# Patient Record
Sex: Male | Born: 1950
Health system: Southern US, Community
[De-identification: ages and names within clinical notes are randomized; demographics above are authoritative.]

## PROBLEM LIST (undated history)

## (undated) DIAGNOSIS — T7840XA Allergy, unspecified, initial encounter: Secondary | ICD-10-CM

## (undated) DIAGNOSIS — E119 Type 2 diabetes mellitus without complications: Secondary | ICD-10-CM

## (undated) HISTORY — DX: Allergy, unspecified, initial encounter: T78.40XA

## (undated) HISTORY — DX: Type 2 diabetes mellitus without complications: E11.9

---

## 2000-11-15 ENCOUNTER — Encounter: Payer: Self-pay | Admitting: Endocrinology

## 2000-11-15 ENCOUNTER — Ambulatory Visit (HOSPITAL_COMMUNITY)
Admission: RE | Admit: 2000-11-15 | Discharge: 2000-11-15 | Payer: Self-pay | Admitting: Physical Medicine and Rehabilitation

## 2010-09-17 LAB — HM COLONOSCOPY: HM COLON: NORMAL

## 2011-10-23 ENCOUNTER — Ambulatory Visit (INDEPENDENT_AMBULATORY_CARE_PROVIDER_SITE_OTHER): Payer: 59

## 2011-10-23 DIAGNOSIS — R509 Fever, unspecified: Secondary | ICD-10-CM

## 2011-10-23 DIAGNOSIS — R05 Cough: Secondary | ICD-10-CM

## 2012-06-05 ENCOUNTER — Ambulatory Visit (INDEPENDENT_AMBULATORY_CARE_PROVIDER_SITE_OTHER): Payer: 59 | Admitting: Emergency Medicine

## 2012-06-05 VITALS — BP 102/70 | HR 68 | Temp 98.2°F | Resp 16 | Ht 68.0 in | Wt 179.2 lb

## 2012-06-05 DIAGNOSIS — N508 Other specified disorders of male genital organs: Secondary | ICD-10-CM

## 2012-06-05 DIAGNOSIS — M899 Disorder of bone, unspecified: Secondary | ICD-10-CM

## 2012-06-05 DIAGNOSIS — N503 Cyst of epididymis: Secondary | ICD-10-CM

## 2012-06-05 DIAGNOSIS — M25559 Pain in unspecified hip: Secondary | ICD-10-CM

## 2012-06-05 MED ORDER — NAPROXEN SODIUM 550 MG PO TABS: 550.0000 mg | ORAL_TABLET | Freq: Two times a day (BID) | ORAL | Status: AC

## 2012-06-05 NOTE — Progress Notes (Signed)
   Date:  06/05/2012   Name:  Joseph Sharp   DOB:  01/18/1951   MRN:  [id number]  PCP:  Tally Due, MD    Chief Complaint: Rectal Pain and Mass   History of Present Illness:  Joseph Sharp is a 61 y.o. very pleasant male patient who presents with the following:  Tender and painful ischium no history of injury or fall.  Says that he has pain when he sits for prolonged period.  No neurologic symptoms or radiation of pain.  No hip joint pain or limitation of motion. Tender mass on right epididymis  No history of infection or injury  There is no problem list on file for this patient.   No past medical history on file.  No past surgical history on file.  History  Substance Use Topics  . Smoking status: Never Smoker   . Smokeless tobacco: Not on file  . Alcohol Use: No    No family history on file.  No Known Allergies  Medication list has been reviewed and updated.  Current Outpatient Prescriptions on File Prior to Visit  Medication Sig Dispense Refill  . glimepiride (AMARYL) 1 MG tablet Take 1 mg by mouth Nightly.      . lovastatin (MEVACOR) 20 MG tablet Take 20 mg by mouth every morning.      . metFORMIN (GLUCOPHAGE) 500 MG tablet Take 500 mg by mouth 2 (two) times daily with a meal.      . pioglitazone (ACTOS) 30 MG tablet Take 30 mg by mouth daily.        Review of Systems:  As per HPI, otherwise negative.    Physical Examination: Filed Vitals:   06/05/12 1903  BP: 102/70  Pulse: 68  Temp: 98.2 F (36.8 C)  Resp: 16   Filed Vitals:   06/05/12 1903  Height: 5\' 8"  (1.727 m)  Weight: 179 lb 3.2 oz (81.285 kg)   Body mass index is 27.25 kg/(m^2). Ideal Body Weight: Weight in (lb) to have BMI = 25: 164.1    GEN: WDWN, NAD, Non-toxic, Alert & Oriented x 3 HEENT: Atraumatic, Normocephalic.  Ears and Nose: No external deformity. EXTR: No clubbing/cyanosis/edema NEURO: Normal gait.  PSYCH: Normally interactive. Conversant. Not  depressed or anxious appearing.  Calm demeanor.  Genitalia:  Normal male circumcised.  Pea sized firm cyst in tail of epididymis. Hip  Full active and passive ROM not tender or painful.  Has tenderness over ischium on left.  Normal gait  Assessment and Plan: Epididymal cyst Pain in buttock Offered xray and refused Offered urology consult and refused.  Ortho consult Anaprox   Carmelina Dane, MD

## 2013-01-11 HISTORY — PX: EYE SURGERY: SHX253

## 2013-06-17 ENCOUNTER — Other Ambulatory Visit: Payer: Self-pay | Admitting: *Deleted

## 2013-06-17 ENCOUNTER — Other Ambulatory Visit: Payer: Self-pay | Admitting: Endocrinology

## 2013-06-17 ENCOUNTER — Other Ambulatory Visit (INDEPENDENT_AMBULATORY_CARE_PROVIDER_SITE_OTHER): Payer: 59

## 2013-06-17 DIAGNOSIS — E7800 Pure hypercholesterolemia, unspecified: Secondary | ICD-10-CM | POA: Insufficient documentation

## 2013-06-17 DIAGNOSIS — E119 Type 2 diabetes mellitus without complications: Secondary | ICD-10-CM | POA: Insufficient documentation

## 2013-06-17 DIAGNOSIS — IMO0001 Reserved for inherently not codable concepts without codable children: Secondary | ICD-10-CM

## 2013-06-17 LAB — COMPREHENSIVE METABOLIC PANEL
ALT: 17 U/L (ref 0–53)
AST: 15 U/L (ref 0–37)
Albumin: 4.1 g/dL (ref 3.5–5.2)
CO2: 25 mEq/L (ref 19–32)
Calcium: 9.5 mg/dL (ref 8.4–10.5)
Chloride: 108 mEq/L (ref 96–112)
GFR: 79.98 mL/min (ref >60.00)
Potassium: 4.3 mEq/L (ref 3.5–5.1)

## 2013-06-17 LAB — MICROALBUMIN / CREATININE URINE RATIO
Creatinine,U: 197.3 mg/dL
Microalb Creat Ratio: 0.3 mg/g (ref 0.0–30.0)
Microalb, Ur: 0.6 mg/dL (ref 0.0–1.9)

## 2013-06-17 LAB — HEMOGLOBIN A1C: Hgb A1c MFr Bld: 7 % — ABNORMAL HIGH (ref 4.6–6.5)

## 2013-06-17 LAB — LIPID PANEL: Total CHOL/HDL Ratio: 3

## 2013-06-20 ENCOUNTER — Encounter: Payer: Self-pay | Admitting: Endocrinology

## 2013-06-20 ENCOUNTER — Ambulatory Visit (INDEPENDENT_AMBULATORY_CARE_PROVIDER_SITE_OTHER): Payer: 59 | Admitting: Endocrinology

## 2013-06-20 VITALS — BP 122/52 | HR 73 | Temp 97.9°F | Resp 12 | Ht 69.0 in | Wt 182.8 lb

## 2013-06-20 NOTE — Patient Instructions (Addendum)
Please check blood sugars at least half the time about 2 hours after any meal and as directed on waking up. Please bring blood sugar monitor to each visit  May take evening Prandin at bedtime  Continue regular exercise and low fat diet

## 2013-06-20 NOTE — Progress Notes (Signed)
Patient ID: Joseph Sharp, male   DOB: 02/11/1951, 62 y.o.   MRN: [id number]  Joseph Sharp is an 62 y.o. male.   Reason for Appointment: Diabetes follow-up   History of Present Illness   Diagnosis: Type 2 DIABETES MELITUS     PAST history: He has had long-standing diabetes managed with multiple drugs and usually well controlled His blood sugar control is somewhat dependent on his compliance with diet and exercise regimen  RECENT history: He has been out of his Actos for the last 2 weeks and blood sugars seem a little higher. Also has been checking blood sugars very sporadically recently His fasting readings appear to be high especially on his lab work. He admits that he forgets to take his Prandin at suppertime but also does not check his blood sugars after supper  Side effects from medications: None Proper timing of medications in relation to meals:  not taking evening dose of Prandin      Monitors blood glucose: Once a day.    Glucometer: One Touch.          Blood Glucose readings from meter download: readings before breakfast: 150-171 with only 3 readings. Overnight 122 PC breakfast 181, 202 and afternoon 102, 138.  Hypoglycemia frequency: Never.          Meals: 3 meals per day.          Physical activity: exercise: walking 4/7             Wt Readings from Last 3 Encounters:  06/20/13 182 lb 12.8 oz (82.918 kg)  06/05/12 179 lb 3.2 oz (81.285 kg)    Appointment on 06/17/2013  Component Date Value Range Status  . Hemoglobin A1C 06/17/2013 7.0* 4.6 - 6.5 % Final   Glycemic Control Guidelines for People with Diabetes:Non Diabetic:  <6%Goal of Therapy: <7%Additional Action Suggested:  >8%   . Sodium 06/17/2013 141  135 - 145 mEq/L Final  . Potassium 06/17/2013 4.3  3.5 - 5.1 mEq/L Final  . Chloride 06/17/2013 108  96 - 112 mEq/L Final  . CO2 06/17/2013 25  19 - 32 mEq/L Final  . Glucose, Bld 06/17/2013 186* 70 - 99 mg/dL Final  . BUN 40/98/1191 18  6 - 23 mg/dL  Final  . Creatinine, Ser 06/17/2013 1.0  0.4 - 1.5 mg/dL Final  . Total Bilirubin 06/17/2013 0.4  0.3 - 1.2 mg/dL Final  . Alkaline Phosphatase 06/17/2013 57  39 - 117 U/L Final  . AST 06/17/2013 15  0 - 37 U/L Final  . ALT 06/17/2013 17  0 - 53 U/L Final  . Total Protein 06/17/2013 6.6  6.0 - 8.3 g/dL Final  . Albumin 47/82/9562 4.1  3.5 - 5.2 g/dL Final  . Calcium 13/06/6577 9.5  8.4 - 10.5 mg/dL Final  . GFR 46/96/2952 79.98  >60.00 mL/min Final  . Cholesterol 06/17/2013 122  0 - 200 mg/dL Final   ATP III Classification       Desirable:  < 200 mg/dL               Borderline High:  200 - 239 mg/dL          High:  > = 841 mg/dL  . Triglycerides 06/17/2013 83.0  0.0 - 149.0 mg/dL Final   Normal:  <324 mg/dLBorderline High:  150 - 199 mg/dL  . HDL 06/17/2013 40.00  >39.00 mg/dL Final  . VLDL 40/08/2724 16.6  0.0 - 40.0 mg/dL Final  . LDL  Cholesterol 06/17/2013 65  0 - 99 mg/dL Final  . Total CHOL/HDL Ratio 06/17/2013 3   Final                  Men          Women1/2 Average Risk     3.4          3.3Average Risk          5.0          4.42X Average Risk          9.6          7.13X Average Risk          15.0          11.0                      . Microalb, Ur 06/17/2013 0.6  0.0 - 1.9 mg/dL Final  . Creatinine,U 40/98/1191 197.3   Final  . Microalb Creat Ratio 06/17/2013 0.3  0.0 - 30.0 mg/g Final      Medication List       This list is accurate as of: 06/20/13  2:59 PM.  Always use your most recent med list.               aspirin 81 MG tablet  Take 81 mg by mouth daily.     cholecalciferol 1000 UNITS tablet  Commonly known as:  VITAMIN D  Take 1,000 Units by mouth daily.     latanoprost 0.005 % ophthalmic solution  Commonly known as:  XALATAN     lovastatin 20 MG tablet  Commonly known as:  MEVACOR  Take 20 mg by mouth every morning.     metFORMIN 500 MG tablet  Commonly known as:  GLUCOPHAGE  Take 500 mg by mouth 2 (two) times daily with a meal. 2 tablets twice a day      pioglitazone 30 MG tablet  Commonly known as:  ACTOS  Take 30 mg by mouth daily.     repaglinide 1 MG tablet  Commonly known as:  PRANDIN  1 mg.     TRADJENTA 5 MG Tabs tablet  Generic drug:  linagliptin  Take 5 mg by mouth daily.        Allergies: No Known Allergies  No past medical history on file.  No past surgical history on file.  No family history on file.  Social History:  reports that he has never smoked. He does not have any smokeless tobacco history on file. He reports that he does not drink alcohol or use illicit drugs.  Review of Systems:  No history of hypertension  HYPERLIPIDEMIA: The lipid abnormality consists of elevated LDL, usually well controlled with lovastatin and LDL is 65.  He has had mild BPH     Examination:   BP 122/52  Pulse 73  Temp(Src) 97.9 F (36.6 C)  Resp 12  Ht 5\' 9"  (1.753 m)  Wt 182 lb 12.8 oz (82.918 kg)  BMI 26.98 kg/m2  SpO2 95%  Body mass index is 26.98 kg/(m^2).   ASSESSMENT/ PLAN::   Diabetes type 2   The patient's diabetes control appears to be overall fairly well controlled although his A1c is relatively higher at 7% He appears to have significant hyperglycemia on waking up especially with a glucose of 186 on the lab work He is however noncompliant with his evening Prandin frequently. Also not clear what his blood sugars are  after supper He has not been taking his Actos for 2 weeks and this may be influencing his readings  He needs to check his blood sugars more often and discussed frequency of monitoring as well as timing and targets He will try to take his Prandin at bedtime at night to help overnight hypoglycemia Continue Actos, metformin and Tradjenta   Joseph Sharp 06/20/2013, 2:59 PM

## 2013-07-01 ENCOUNTER — Other Ambulatory Visit: Payer: Self-pay | Admitting: Endocrinology

## 2013-09-05 ENCOUNTER — Other Ambulatory Visit: Payer: Self-pay | Admitting: Endocrinology

## 2013-09-19 ENCOUNTER — Other Ambulatory Visit (INDEPENDENT_AMBULATORY_CARE_PROVIDER_SITE_OTHER): Payer: 59

## 2013-09-19 DIAGNOSIS — IMO0001 Reserved for inherently not codable concepts without codable children: Secondary | ICD-10-CM

## 2013-09-19 LAB — BASIC METABOLIC PANEL
BUN: 18 mg/dL (ref 6–23)
Chloride: 106 mEq/L (ref 96–112)
Creatinine, Ser: 1 mg/dL (ref 0.4–1.5)
GFR: 81.78 mL/min (ref >60.00)

## 2013-09-26 ENCOUNTER — Ambulatory Visit (INDEPENDENT_AMBULATORY_CARE_PROVIDER_SITE_OTHER): Payer: 59 | Admitting: Endocrinology

## 2013-09-26 ENCOUNTER — Encounter: Payer: Self-pay | Admitting: Endocrinology

## 2013-09-26 VITALS — BP 138/88 | HR 83 | Temp 98.6°F | Resp 12 | Ht 69.0 in | Wt 188.3 lb

## 2013-09-26 DIAGNOSIS — R5381 Other malaise: Secondary | ICD-10-CM

## 2013-09-26 DIAGNOSIS — E7800 Pure hypercholesterolemia, unspecified: Secondary | ICD-10-CM

## 2013-09-26 NOTE — Progress Notes (Signed)
Patient ID: Joseph Sharp, male   DOB: 02/06/1951, 62 y.o.   MRN: [id number]  Joseph Sharp is an 62 y.o. male.   Reason for Appointment: Diabetes follow-up   History of Present Illness   Diagnosis: Type 2 DIABETES MELITUS, diagnosed in 1977     PAST history: He has had long-standing diabetes managed with multiple drugs and usually well controlled His blood sugar control is somewhat dependent on his compliance with diet and exercise regimen  RECENT history: On his last visit his blood sugars seem a little higher.  Since most of his high readings her fasting and was told to try his Prandin at bedtime instead of suppertime However he still forgetting to do this and his fasting readings are consistently high Also he has gained weight from inconsistent compliance with diet Although he is checking his blood sugars more frequently the last 3 days he has only occasional readings later in the day and only once after supper Also has been checking blood sugars very sporadically recently A1c is still higher than his usual upper normal level  Side effects from medications: None Proper timing of medications in relation to meals:  not taking evening dose of Prandin      Monitors blood glucose: Once a day.    Glucometer: One Touch.          Blood Glucose readings from meter download: readings before breakfast: 143-173 and nonfasting otherwise 107-172 with only one high reading  Hypoglycemia frequency:  none.          Meals: 3 meals per day.          Physical activity: exercise: walking 3/7 days             Wt Readings from Last 3 Encounters:  09/26/13 188 lb 4.8 oz (85.412 kg)  06/20/13 182 lb 12.8 oz (82.918 kg)  06/05/12 179 lb 3.2 oz (81.285 kg)    No visits with results within 1 Week(s) from this visit. Latest known visit with results is:  Appointment on 09/19/2013  Component Date Value Range Status  . Hemoglobin A1C 09/19/2013 7.0* 4.6 - 6.5 % Final   Glycemic Control  Guidelines for People with Diabetes:Non Diabetic:  <6%Goal of Therapy: <7%Additional Action Suggested:  >8%   . Sodium 09/19/2013 140  135 - 145 mEq/L Final  . Potassium 09/19/2013 4.2  3.5 - 5.1 mEq/L Final  . Chloride 09/19/2013 106  96 - 112 mEq/L Final  . CO2 09/19/2013 26  19 - 32 mEq/L Final  . Glucose, Bld 09/19/2013 104* 70 - 99 mg/dL Final  . BUN 40/98/1191 18  6 - 23 mg/dL Final  . Creatinine, Ser 09/19/2013 1.0  0.4 - 1.5 mg/dL Final  . Calcium 47/82/9562 9.9  8.4 - 10.5 mg/dL Final  . GFR 13/06/6577 81.78  >60.00 mL/min Final      Medication List       This list is accurate as of: 09/26/13 11:59 PM.  Always use your most recent med list.               aspirin 81 MG tablet  Take 81 mg by mouth daily.     Azelastine HCl 0.15 % Soln     cholecalciferol 1000 UNITS tablet  Commonly known as:  VITAMIN D  Take 1,000 Units by mouth daily.     EPIPEN 2-PAK 0.3 mg/0.3 mL Soaj injection  Generic drug:  EPINEPHrine     fluticasone 50 MCG/ACT nasal spray  Commonly known as:  FLONASE     latanoprost 0.005 % ophthalmic solution  Commonly known as:  XALATAN     lovastatin 20 MG tablet  Commonly known as:  MEVACOR  Take 20 mg by mouth every morning.     metFORMIN 500 MG 24 hr tablet  Commonly known as:  GLUCOPHAGE-XR  TAKE 4 TABLETS ONCE DAILY     pioglitazone 30 MG tablet  Commonly known as:  ACTOS  TAKE 1 TABLET ONCE DAILY     repaglinide 1 MG tablet  Commonly known as:  PRANDIN  TAKE 1 TABLET BY MOUTH BEFORE SUPPER AND 2 BEFORE BREAKFAST     TRADJENTA 5 MG Tabs tablet  Generic drug:  linagliptin  Take 5 mg by mouth daily.        Allergies: No Known Allergies  Past Medical History  Diagnosis Date  . Allergy     No past surgical history on file.  Family History  Problem Relation Age of Onset  . Diabetes Father     Social History:  reports that he has never smoked. He does not have any smokeless tobacco history on file. He reports that he does  not drink alcohol or use illicit drugs.  Review of Systems:  No history of hypertension   HYPERLIPIDEMIA: The lipid abnormality consists of elevated LDL, usually well controlled with lovastatin and LDL was last 65.  He has had mild BPH  Has a history of erectile dysfunction  He is asking about taking thyroid levels because of his feeling of tiredness, some decrease in memory some dry skin and also some decrease in libido   He previously had transient hypogonadism which subsequently had improved     Examination:   BP 138/88  Pulse 83  Temp(Src) 98.6 F (37 C)  Resp 12  Ht 5\' 9"  (1.753 m)  Wt 188 lb 4.8 oz (85.412 kg)  BMI 27.79 kg/m2  SpO2 97%  Body mass index is 27.79 kg/(m^2).  Repeat blood pressure 122/80  ASSESSMENT/ PLAN::   Diabetes type 2   The patient's diabetes control appears to be overall more difficult to control especially overnight readings Also he is gaining significant amount of weight He has difficulty remembering his mealtime Prandin and previously had difficulty with some hypoglycemia using Amaryl Most likely he can benefit from a GLP-1 drug instead of Tradjenta and Prandin and should be able to get some weight loss also  Have discussed with the patient the use of GLP-1 drugs and the mechanism of how they work and benefit blood glucose as well as potentially help with weight loss, increase satiety and gastric fullness. Explained possible side effects and safety information Have shown him how to use the pen device. Patient sample of 0.75 mg and co-pay card given He can stop his Tradjenta when finished and also does not need to take Prandin except in the morning Encouraged him to improve his diet also  Also needs to check more readings after meals and continue exercise  To check thyroid levels on his next visit; his symptoms may be partially related to hypogonadism related to his metabolic syndrome and weight gain  Joseph Sharp 09/29/2013, 3:01 PM

## 2013-09-26 NOTE — Patient Instructions (Signed)
Start Trulicity ONCE  A WEEK 0.75 MG  STOP Tradgenta when out of it  Stop Prandin in pm  Walk daily  Please check blood sugars at least half the time about 2 hours after any meal and as directed on waking up. Please bring blood sugar monitor to each visit

## 2013-09-29 ENCOUNTER — Encounter: Payer: Self-pay | Admitting: Endocrinology

## 2013-10-03 ENCOUNTER — Other Ambulatory Visit: Payer: Self-pay | Admitting: *Deleted

## 2013-10-03 ENCOUNTER — Telehealth: Payer: Self-pay | Admitting: Endocrinology

## 2013-10-03 MED ORDER — DULAGLUTIDE 0.75 MG/0.5ML ~~LOC~~ SOAJ: SUBCUTANEOUS | Status: DC

## 2013-10-03 NOTE — Telephone Encounter (Signed)
Pt states the new prescription trulicity (?) needs to be called into pharmacy  [facility] [address] [phone]  Thank You :)

## 2013-10-17 ENCOUNTER — Ambulatory Visit: Payer: 59 | Admitting: Physician Assistant

## 2013-10-17 VITALS — BP 112/66 | HR 87 | Temp 98.5°F | Resp 16 | Ht 68.0 in | Wt 184.2 lb

## 2013-10-17 DIAGNOSIS — J329 Chronic sinusitis, unspecified: Secondary | ICD-10-CM

## 2013-10-17 DIAGNOSIS — R05 Cough: Secondary | ICD-10-CM

## 2013-10-17 MED ORDER — AMOXICILLIN-POT CLAVULANATE 875-125 MG PO TABS: 1.0000 | ORAL_TABLET | Freq: Two times a day (BID) | ORAL | Status: DC

## 2013-10-17 NOTE — Patient Instructions (Signed)
THE 3 SIMPLE RULES FOR NASAL SPRAY USE: 1. Don't snort. 2. Look at your toes and spray up your nose. 3. Use the opposite hand to spray in both nostrils.  Use the Astelin nasal spray 2 sprays in each nostril 2 times each day. OTC Mucinex is a great product to thin mucous.  Get plenty of rest and drink at least 64 ounces of water daily.

## 2013-10-17 NOTE — Progress Notes (Signed)
Subjective:    Patient ID: Joseph Sharp, male    DOB: November 17, 1950, 62 y.o.   MRN: [id number]  PCP: Tally Due, MD  Chief Complaint  Patient presents with  . Sore Throat    x 8 days   . Cough    productive   . Nasal Congestion    HPI  "I always have chills." "Nothing that feels like a fever."  No GI/GU symptoms.  Hasn't been checking his glucose at home, concerned that it may be up since he's been sick.   Feels worst in the mornings, but improves as he gets up and going, then starts to feel bad again before bed. HIs wife finally convinced him to come in for evaluation.  Medications, allergies, past medical history, surgical history, family history, social history and problem list reviewed and updated.  Patient Active Problem List   Diagnosis Date Noted  . Type II or unspecified type diabetes mellitus without mention of complication, uncontrolled 06/17/2013  . Pure hypercholesterolemia 06/17/2013   Prior to Admission medications   Medication Sig Start Date End Date Taking? Authorizing Provider  aspirin 81 MG tablet Take 81 mg by mouth daily.   Yes Historical Provider, MD  Azelastine HCl 0.15 % SOLN  07/29/13  Yes Historical Provider, MD  cholecalciferol (VITAMIN D) 1000 UNITS tablet Take 1,000 Units by mouth daily.   Yes Historical Provider, MD  Dulaglutide (TRULICITY) 0.75 MG/0.5ML SOPN Inject into the skin once a week 10/03/13  Yes Reather Littler, MD  EPIPEN 2-PAK 0.3 MG/0.3ML SOAJ injection  07/29/13  Yes Historical Provider, MD  fluticasone (FLONASE) 50 MCG/ACT nasal spray  07/29/13  Yes Historical Provider, MD  latanoprost (XALATAN) 0.005 % ophthalmic solution  04/26/13  Yes Historical Provider, MD  linagliptin (TRADJENTA) 5 MG TABS tablet Take 5 mg by mouth daily.   Yes Historical Provider, MD  lovastatin (MEVACOR) 20 MG tablet Take 20 mg by mouth every morning.   Yes Historical Provider, MD  metFORMIN (GLUCOPHAGE-XR) 500 MG 24 hr tablet TAKE 4 TABLETS ONCE DAILY  09/05/13  Yes Reather Littler, MD  pioglitazone (ACTOS) 30 MG tablet TAKE 1 TABLET ONCE DAILY 09/05/13  Yes Reather Littler, MD  repaglinide (PRANDIN) 1 MG tablet 2 BEFORE BREAKFAST 07/01/13  Yes Reather Littler, MD   No Known Allergies    Review of Systems As above.    Objective:   Physical Exam  Vitals reviewed. Constitutional: He is oriented to person, place, and time. Vital signs are normal. He appears well-developed and well-nourished. No distress.  HENT:  Head: Normocephalic and atraumatic.  Right Ear: Hearing, tympanic membrane, external ear and ear canal normal.  Left Ear: Hearing, tympanic membrane, external ear and ear canal normal.  Nose: Mucosal edema and rhinorrhea present.  No foreign bodies. Right sinus exhibits no maxillary sinus tenderness and no frontal sinus tenderness. Left sinus exhibits no maxillary sinus tenderness and no frontal sinus tenderness.  Mouth/Throat: Uvula is midline, oropharynx is clear and moist and mucous membranes are normal. No uvula swelling. No oropharyngeal exudate.  Mucopurulent drainage noted in the posterior pharynx.  Eyes: Conjunctivae and EOM are normal. Pupils are equal, round, and reactive to light. Right eye exhibits no discharge. Left eye exhibits no discharge. No scleral icterus.  Neck: Trachea normal, normal range of motion and full passive range of motion without pain. Neck supple. No mass and no thyromegaly present.  Cardiovascular: Normal rate, regular rhythm and normal heart sounds.   Pulmonary/Chest: Effort normal and  breath sounds normal.  Lymphadenopathy:       Head (right side): No submandibular, no tonsillar, no preauricular, no posterior auricular and no occipital adenopathy present.       Head (left side): No submandibular, no tonsillar, no preauricular and no occipital adenopathy present.    He has no cervical adenopathy.       Right: No supraclavicular adenopathy present.       Left: No supraclavicular adenopathy present.    Neurological: He is alert and oriented to person, place, and time. He has normal strength. No cranial nerve deficit or sensory deficit.  Skin: Skin is warm, dry and intact. No rash noted.  Psychiatric: He has a normal mood and affect. His speech is normal and behavior is normal.          Assessment & Plan:  1. Sinusitis Likely secondary to initial viral URI - amoxicillin-clavulanate (AUGMENTIN) 875-125 MG per tablet; Take 1 tablet by mouth 2 (two) times daily.  Dispense: 20 tablet; Refill: 0  2. Cough Due to post-nasal drainage  Fernande Bras, PA-C Certified Physician Assistant [facility]

## 2013-11-11 ENCOUNTER — Other Ambulatory Visit (INDEPENDENT_AMBULATORY_CARE_PROVIDER_SITE_OTHER): Payer: 59

## 2013-11-11 LAB — BASIC METABOLIC PANEL
CO2: 24 mEq/L (ref 19–32)
Calcium: 9.5 mg/dL (ref 8.4–10.5)
Chloride: 111 mEq/L (ref 96–112)
GFR: 75.54 mL/min (ref >60.00)
Glucose, Bld: 155 mg/dL — ABNORMAL HIGH (ref 70–99)
Potassium: 4 mEq/L (ref 3.5–5.1)
Sodium: 142 mEq/L (ref 135–145)

## 2013-11-11 LAB — FRUCTOSAMINE: Fructosamine: 265 umol/L (ref <285)

## 2013-11-11 LAB — T4, FREE: Free T4: 0.77 ng/dL (ref 0.60–1.60)

## 2013-11-14 ENCOUNTER — Ambulatory Visit: Payer: 59 | Admitting: Endocrinology

## 2013-11-14 DIAGNOSIS — Z0289 Encounter for other administrative examinations: Secondary | ICD-10-CM

## 2013-11-18 ENCOUNTER — Encounter: Payer: Self-pay | Admitting: Endocrinology

## 2013-11-18 ENCOUNTER — Ambulatory Visit (INDEPENDENT_AMBULATORY_CARE_PROVIDER_SITE_OTHER): Payer: 59 | Admitting: Endocrinology

## 2013-11-18 VITALS — BP 116/68 | HR 83 | Temp 98.2°F | Resp 12 | Ht 69.0 in | Wt 185.1 lb

## 2013-11-18 DIAGNOSIS — E7800 Pure hypercholesterolemia, unspecified: Secondary | ICD-10-CM

## 2013-11-18 DIAGNOSIS — IMO0001 Reserved for inherently not codable concepts without codable children: Secondary | ICD-10-CM

## 2013-11-18 DIAGNOSIS — R5383 Other fatigue: Secondary | ICD-10-CM

## 2013-11-18 DIAGNOSIS — E1165 Type 2 diabetes mellitus with hyperglycemia: Principal | ICD-10-CM

## 2013-11-18 DIAGNOSIS — R5381 Other malaise: Secondary | ICD-10-CM

## 2013-11-18 NOTE — Progress Notes (Signed)
Patient ID: Joseph Sharp, male   DOB: 11/14/1950, 63 y.o.   MRN: [id number]   Reason for Appointment: Diabetes follow-up   History of Present Illness   Diagnosis: Type 2 DIABETES MELITUS, diagnosed in 1977     PAST history: He has had long-standing diabetes managed with multiple drugs and usually well controlled His blood sugar control is somewhat dependent on his compliance with diet and exercise regimen  RECENT history: On his last visit his blood sugars were higher and A1c was higher than his usual upper normal level Because of this he was started on Trulicity injections, 4.78 mg With this he appears to have overall better readings although does have periodic high readings still in the morning He thinks it has helped portion control somewhat and has lost about 3 pounds She has not had any nausea and has been able to do the injection weekly without difficulty However has not been exercising much because of respiratory illness He has not stopped his Tradjenta as directed ORAL hypoglycemic drugs: Prandin in a.m., metformin, pioglitazone Side effects from medications: None  Monitors blood glucose: Once a day.    Glucometer:  FreeStyle         Blood Glucose readings from meter download: readings before breakfast: 113, 137 141; PC breakfast highest 191 Lunchtime 90, 178, after lunch 171, suppertime 91 Overall average 129 No hypoglycemia          EXERCISE: Less recently because of respiratory infection           Wt Readings from Last 3 Encounters:  11/18/13 185 lb 1.6 oz (83.961 kg)  10/17/13 184 lb 3.2 oz (83.553 kg)  09/26/13 188 lb 4.8 oz (85.412 kg)    No visits with results within 1 Week(s) from this visit. Latest known visit with results is:  Appointment on 11/11/2013  Component Date Value Range Status  . TSH 11/11/2013 1.95  0.35 - 5.50 uIU/mL Final  . Free T4 11/11/2013 0.77  0.60 - 1.60 ng/dL Final  . Fructosamine 11/11/2013 265  <285 umol/L Final   Comment:                             Variations in levels of serum proteins (albumin and immunoglobulins)                          may affect fructosamine results.                             . Sodium 11/11/2013 142  135 - 145 mEq/L Final  . Potassium 11/11/2013 4.0  3.5 - 5.1 mEq/L Final  . Chloride 11/11/2013 111  96 - 112 mEq/L Final  . CO2 11/11/2013 24  19 - 32 mEq/L Final  . Glucose, Bld 11/11/2013 155* 70 - 99 mg/dL Final  . BUN 11/11/2013 18  6 - 23 mg/dL Final  . Creatinine, Ser 11/11/2013 1.1  0.4 - 1.5 mg/dL Final  . Calcium 11/11/2013 9.5  8.4 - 10.5 mg/dL Final  . GFR 11/11/2013 75.54  >60.00 mL/min Final      Medication List       This list is accurate as of: 11/18/13  8:53 AM.  Always use your most recent med list.               aspirin 81 MG tablet  Take 81  mg by mouth daily.     Azelastine HCl 0.15 % Soln     cholecalciferol 1000 UNITS tablet  Commonly known as:  VITAMIN D  Take 1,000 Units by mouth daily.     Dulaglutide 0.75 MG/0.5ML Sopn  Commonly known as:  TRULICITY  Inject into the skin once a week     EPIPEN 2-PAK 0.3 mg/0.3 mL Soaj injection  Generic drug:  EPINEPHrine     fluticasone 50 MCG/ACT nasal spray  Commonly known as:  FLONASE     latanoprost 0.005 % ophthalmic solution  Commonly known as:  XALATAN     lovastatin 20 MG tablet  Commonly known as:  MEVACOR  Take 20 mg by mouth every morning.     metFORMIN 500 MG 24 hr tablet  Commonly known as:  GLUCOPHAGE-XR  TAKE 4 TABLETS ONCE DAILY     pioglitazone 30 MG tablet  Commonly known as:  ACTOS  TAKE 1 TABLET ONCE DAILY     repaglinide 1 MG tablet  Commonly known as:  PRANDIN  2 BEFORE BREAKFAST     TRADJENTA 5 MG Tabs tablet  Generic drug:  linagliptin  Take 5 mg by mouth daily.        Allergies: No Known Allergies  Past Medical History  Diagnosis Date  . Allergy   . Diabetes mellitus without complication     Past Surgical History  Procedure Laterality Date  . Eye surgery   01/2013    Eyelid surgery    Family History  Problem Relation Age of Onset  . Diabetes Mother   . Arthritis Mother   . Diabetes Brother     Social History:  reports that he has never smoked. He has never used smokeless tobacco. He reports that he does not drink alcohol or use illicit drugs.  Review of Systems:  No history of hypertension   HYPERLIPIDEMIA: The lipid abnormality consists of elevated LDL, usually well controlled with lovastatin   Lab Results  Component Value Date   CHOL 122 06/17/2013   HDL 40.00 06/17/2013   LDLCALC 65 06/17/2013   TRIG 83.0 06/17/2013   CHOLHDL 3 06/17/2013    He wanted to check thyroid levels because of his feeling of tiredness, some decrease in memory some dry skin and also some decrease in libido, TSH is normal   He previously had transient hypogonadism which subsequently had improved     Examination:   BP 116/68  Pulse 83  Temp(Src) 98.2 F (36.8 C)  Resp 12  Ht 5\' 9"  (1.753 m)  Wt 185 lb 1.6 oz (83.961 kg)  BMI 27.32 kg/m2  SpO2 95%  Body mass index is 27.32 kg/(m^2).    ASSESSMENT/ PLAN::   Diabetes type 2   The patient's diabetes control appears to be overall better as judged by his fructosamine He is starting to benefit from starting 7.98 mg Trulicity which appears to be better than Tradjenta with his glucose control and controlling the weight gain he was having Also fasting blood sugars are somewhat better even though he is not taking Prandin at night Since he has no side effects and is getting benefit we'll continue the same dose Also he will be able to do better with restarting exercise which he has not been able to do   [facility] 11/18/2013, 8:53 AM

## 2013-11-18 NOTE — Patient Instructions (Signed)
Restart walking  Stop Tradjenta   Please check blood sugars at least half the time about 2 hours after any meal and as directed on waking up. Please bring blood sugar monitor to each visit

## 2013-11-29 ENCOUNTER — Other Ambulatory Visit: Payer: Self-pay | Admitting: Endocrinology

## 2013-12-02 LAB — HM DIABETES EYE EXAM

## 2013-12-03 ENCOUNTER — Encounter: Payer: Self-pay | Admitting: *Deleted

## 2014-01-01 ENCOUNTER — Other Ambulatory Visit: Payer: Self-pay | Admitting: Endocrinology

## 2014-01-23 ENCOUNTER — Other Ambulatory Visit: Payer: 59

## 2014-01-26 ENCOUNTER — Other Ambulatory Visit: Payer: Self-pay | Admitting: Endocrinology

## 2014-01-26 ENCOUNTER — Ambulatory Visit: Payer: 59 | Admitting: Endocrinology

## 2014-01-26 ENCOUNTER — Other Ambulatory Visit: Payer: 59

## 2014-01-26 LAB — CBC
HCT: 43.5 % (ref 39.0–52.0)
HEMOGLOBIN: 14.8 g/dL (ref 13.0–17.0)
MCH: 28.6 pg (ref 26.0–34.0)
MCHC: 34 g/dL (ref 30.0–36.0)
MCV: 84 fL (ref 78.0–100.0)
Platelets: 255 10*3/uL (ref 150–400)
RBC: 5.18 MIL/uL (ref 4.22–5.81)
RDW: 14.4 % (ref 11.5–15.5)
WBC: 7 10*3/uL (ref 4.0–10.5)

## 2014-01-26 LAB — LIPID PANEL
Cholesterol: 125 mg/dL (ref 0–200)
HDL: 38 mg/dL — ABNORMAL LOW (ref >39)
LDL CALC: 65 mg/dL (ref 0–99)
Total CHOL/HDL Ratio: 3.3 Ratio
Triglycerides: 111 mg/dL (ref <150)
VLDL: 22 mg/dL (ref 0–40)

## 2014-01-26 LAB — COMPREHENSIVE METABOLIC PANEL
ALBUMIN: 4.1 g/dL (ref 3.5–5.2)
ALK PHOS: 59 U/L (ref 39–117)
ALT: 17 U/L (ref 0–53)
AST: 12 U/L (ref 0–37)
BUN: 17 mg/dL (ref 6–23)
CO2: 26 mEq/L (ref 19–32)
Calcium: 9.6 mg/dL (ref 8.4–10.5)
Chloride: 107 mEq/L (ref 96–112)
Creat: 0.95 mg/dL (ref 0.50–1.35)
Glucose, Bld: 128 mg/dL — ABNORMAL HIGH (ref 70–99)
POTASSIUM: 4.5 meq/L (ref 3.5–5.3)
SODIUM: 142 meq/L (ref 135–145)
Total Bilirubin: 0.3 mg/dL (ref 0.2–1.2)
Total Protein: 6.1 g/dL (ref 6.0–8.3)

## 2014-01-26 LAB — HEMOGLOBIN A1C
HEMOGLOBIN A1C: 6.6 % — AB (ref <5.7)
MEAN PLASMA GLUCOSE: 143 mg/dL — AB (ref <117)

## 2014-01-27 LAB — URINALYSIS, ROUTINE W REFLEX MICROSCOPIC
Bilirubin Urine: NEGATIVE
Glucose, UA: NEGATIVE mg/dL
Hgb urine dipstick: NEGATIVE
Ketones, ur: NEGATIVE mg/dL
LEUKOCYTES UA: NEGATIVE
NITRITE: NEGATIVE
Protein, ur: NEGATIVE mg/dL
Specific Gravity, Urine: 1.022 (ref 1.005–1.030)
UROBILINOGEN UA: 0.2 mg/dL (ref 0.0–1.0)
pH: 7 (ref 5.0–8.0)

## 2014-01-27 LAB — MICROALBUMIN / CREATININE URINE RATIO
CREATININE, URINE: 173.7 mg/dL
Microalb Creat Ratio: 2.9 mg/g (ref 0.0–30.0)
Microalb, Ur: 0.5 mg/dL (ref 0.00–1.89)

## 2014-01-30 ENCOUNTER — Encounter: Payer: Self-pay | Admitting: Endocrinology

## 2014-01-30 ENCOUNTER — Ambulatory Visit (INDEPENDENT_AMBULATORY_CARE_PROVIDER_SITE_OTHER): Payer: 59 | Admitting: Endocrinology

## 2014-01-30 VITALS — BP 122/82 | HR 92 | Temp 98.2°F | Resp 16 | Ht 69.0 in | Wt 185.8 lb

## 2014-01-30 DIAGNOSIS — E1165 Type 2 diabetes mellitus with hyperglycemia: Principal | ICD-10-CM

## 2014-01-30 DIAGNOSIS — IMO0001 Reserved for inherently not codable concepts without codable children: Secondary | ICD-10-CM

## 2014-01-30 NOTE — Patient Instructions (Signed)
May try Zantac or Prilosec  Walk daily

## 2014-01-30 NOTE — Progress Notes (Signed)
Patient ID: Joseph Sharp, male   DOB: 09-Jan-1951, 63 y.o.   MRN: [id number]   Reason for Appointment: Diabetes follow-up   History of Present Illness   Diagnosis: Type 2 DIABETES MELITUS, diagnosed in 1977     PAST history: He has had long-standing diabetes managed with multiple drugs and usually well controlled His blood sugar control is somewhat dependent on his compliance with diet and exercise regimen  RECENT history: In 11 /14 his blood sugars were higher overall and A1c was 7% which was higher than usual Because of this he was started on Trulicity injections, 3.71 mg With this he continues have overall better readings and his A1c is improved More recently he has had good blood sugars at home also with only occasional readings around 150-160 after meals Also fasting blood sugars are overall better recently. No side effects from Trulicity Oral hypoglycemics: He has been taking the Prandin only at breakfast time However has not been exercising because of whether ORAL hypoglycemic drugs: Prandin in a.m., metformin, pioglitazone Side effects from medications:  he gets some burping and a little heartburn for a couple of days after taking his Trulicity  Monitors blood glucose: Once a day.    Glucometer:  FreeStyle         Blood Glucose readings recently from meter download:  PREMEAL Breakfast Lunch Dinner Bedtime Overall  Glucose range:  92-126   81  97    81-166   Mean/median:      125    POST-MEAL PC Breakfast PC Lunch PC Dinner  Glucose range:  166   150   149-163   Mean/median:      No hypoglycemia          Diet: Improving with smaller portions. Has a protein drink with oatmeal in am; some fast food EXERCISE: Has not started walking regularly as yet          Wt Readings from Last 3 Encounters:  01/30/14 185 lb 12.8 oz (84.278 kg)  11/18/13 185 lb 1.6 oz (83.961 kg)  10/17/13 184 lb 3.2 oz (83.553 kg)   Lab Results  Component Value Date   HGBA1C 6.6* 01/26/2014    HGBA1C 7.0* 09/19/2013   HGBA1C 7.0* 06/17/2013   Lab Results  Component Value Date   MICROALBUR 0.50 01/26/2014   LDLCALC 65 01/26/2014   CREATININE 0.95 01/26/2014    Orders Only on 01/26/2014  Component Date Value Ref Range Status  . Microalb, Ur 01/26/2014 0.50  0.00 - 1.89 mg/dL Final  . Creatinine, Urine 01/26/2014 173.7   Final  . Microalb Creat Ratio 01/26/2014 2.9  0.0 - 30.0 mg/g Final  . WBC 01/26/2014 7.0  4.0 - 10.5 K/uL Final  . RBC 01/26/2014 5.18  4.22 - 5.81 MIL/uL Final  . Hemoglobin 01/26/2014 14.8  13.0 - 17.0 g/dL Final  . HCT 01/26/2014 43.5  39.0 - 52.0 % Final  . MCV 01/26/2014 84.0  78.0 - 100.0 fL Final  . MCH 01/26/2014 28.6  26.0 - 34.0 pg Final  . MCHC 01/26/2014 34.0  30.0 - 36.0 g/dL Final  . RDW 01/26/2014 14.4  11.5 - 15.5 % Final  . Platelets 01/26/2014 255  150 - 400 K/uL Final  . Sodium 01/26/2014 142  135 - 145 mEq/L Final  . Potassium 01/26/2014 4.5  3.5 - 5.3 mEq/L Final  . Chloride 01/26/2014 107  96 - 112 mEq/L Final  . CO2 01/26/2014 26  19 - 32 mEq/L Final  .  Glucose, Bld 01/26/2014 128* 70 - 99 mg/dL Final  . BUN 01/26/2014 17  6 - 23 mg/dL Final  . Creat 01/26/2014 0.95  0.50 - 1.35 mg/dL Final  . Total Bilirubin 01/26/2014 0.3  0.2 - 1.2 mg/dL Final  . Alkaline Phosphatase 01/26/2014 59  39 - 117 U/L Final  . AST 01/26/2014 12  0 - 37 U/L Final  . ALT 01/26/2014 17  0 - 53 U/L Final  . Total Protein 01/26/2014 6.1  6.0 - 8.3 g/dL Final  . Albumin 01/26/2014 4.1  3.5 - 5.2 g/dL Final  . Calcium 01/26/2014 9.6  8.4 - 10.5 mg/dL Final  . Cholesterol 01/26/2014 125  0 - 200 mg/dL Final   Comment: ATP III Classification:                                < 200        mg/dL        Desirable                               200 - 239     mg/dL        Borderline High                               >= 240        mg/dL        High                             . Triglycerides 01/26/2014 111  <150 mg/dL Final  . HDL 01/26/2014 38* >39 mg/dL Final  .  Total CHOL/HDL Ratio 01/26/2014 3.3   Final  . VLDL 01/26/2014 22  0 - 40 mg/dL Final  . LDL Cholesterol 01/26/2014 65  0 - 99 mg/dL Final   Comment:                            Total Cholesterol/HDL Ratio:CHD Risk                                                 Coronary Heart Disease Risk Table                                                                 Men       Women                                   1/2 Average Risk              3.4        3.3  Average Risk              5.0        4.4                                    2X Average Risk              9.6        7.1                                    3X Average Risk             23.4       11.0                          Use the calculated Patient Ratio above and the CHD Risk table                           to determine the patient's CHD Risk.                          ATP III Classification (LDL):                                < 100        mg/dL         Optimal                               100 - 129     mg/dL         Near or Above Optimal                               130 - 159     mg/dL         Borderline High                               160 - 189     mg/dL         High                                > 190        mg/dL         Very High                             . Hemoglobin A1C 01/26/2014 6.6* <5.7 % Final   Comment:  According to the ADA Clinical Practice Recommendations for 2011, when                          HbA1c is used as a screening test:                                                       >=6.5%   Diagnostic of Diabetes Mellitus                                     (if abnormal result is confirmed)                                                     5.7-6.4%   Increased risk of developing Diabetes Mellitus                                                     References:Diagnosis and  Classification of Diabetes Mellitus,Diabetes                          [id number] 1):S62-S69 and Standards of Medical Care in                                  Diabetes - 2011,Diabetes [id number] (Suppl 1):S11-S61.                             . Mean Plasma Glucose 01/26/2014 143* <117 mg/dL Final  . Color, Urine 01/26/2014 YELLOW  YELLOW Final  . APPearance 01/26/2014 CLEAR  CLEAR Final  . Specific Gravity, Urine 01/26/2014 1.022  1.005 - 1.030 Final  . pH 01/26/2014 7.0  5.0 - 8.0 Final  . Glucose, UA 01/26/2014 NEG  NEG mg/dL Final  . Bilirubin Urine 01/26/2014 NEG  NEG Final  . Ketones, ur 01/26/2014 NEG  NEG mg/dL Final  . Hgb urine dipstick 01/26/2014 NEG  NEG Final  . Protein, ur 01/26/2014 NEG  NEG mg/dL Final  . Urobilinogen, UA 01/26/2014 0.2  0.0 - 1.0 mg/dL Final  . Nitrite 01/26/2014 NEG  NEG Final  . Leukocytes, UA 01/26/2014 NEG  NEG Final      Medication List       This list is accurate as of: 01/30/14  3:20 PM.  Always use your most recent med list.               aspirin 81 MG tablet  Take 81 mg by mouth daily.     Azelastine HCl 0.15 % Soln     cholecalciferol 1000 UNITS tablet  Commonly known as:  VITAMIN D  Take 1,000 Units by mouth daily.     Dulaglutide 0.75 MG/0.5ML Sopn  Commonly known as:  TRULICITY  Inject into the skin once a week  EPIPEN 2-PAK 0.3 mg/0.3 mL Soaj injection  Generic drug:  EPINEPHrine     fluticasone 50 MCG/ACT nasal spray  Commonly known as:  FLONASE     latanoprost 0.005 % ophthalmic solution  Commonly known as:  XALATAN     lovastatin 20 MG tablet  Commonly known as:  MEVACOR  Take 20 mg by mouth every morning.     metFORMIN 500 MG 24 hr tablet  Commonly known as:  GLUCOPHAGE-XR  TAKE 4 TABLETS ONCE DAILY     pioglitazone 30 MG tablet  Commonly known as:  ACTOS  TAKE 1 TABLET ONCE DAILY     repaglinide 1 MG tablet  Commonly known as:  PRANDIN  TAKE 1 TABLET BY MOUTH BEFORE SUPPER AND 2 BEFORE  BREAKFAST     TRADJENTA 5 MG Tabs tablet  Generic drug:  linagliptin  Take 5 mg by mouth daily.        Allergies: No Known Allergies  Past Medical History  Diagnosis Date  . Allergy   . Diabetes mellitus without complication     Past Surgical History  Procedure Laterality Date  . Eye surgery  01/2013    Eyelid surgery    Family History  Problem Relation Age of Onset  . Diabetes Mother   . Arthritis Mother   . Diabetes Brother     Social History:  reports that he has never smoked. He has never used smokeless tobacco. He reports that he does not drink alcohol or use illicit drugs.  Review of Systems:  No history of hypertension   HYPERLIPIDEMIA: The lipid abnormality consists of elevated LDL and low HDL, usually well controlled with lovastatin   Lab Results  Component Value Date   CHOL 125 01/26/2014   HDL 38* 01/26/2014   LDLCALC 65 01/26/2014   TRIG 111 01/26/2014   CHOLHDL 3.3 01/26/2014    He previously had transient hypogonadism which subsequently had resolved     Examination:   BP 122/82  Pulse 92  Temp(Src) 98.2 F (36.8 C)  Resp 16  Ht 5\' 9"  (1.753 m)  Wt 185 lb 12.8 oz (84.278 kg)  BMI 27.43 kg/m2  SpO2 97%  Body mass index is 27.43 kg/(m^2).    ASSESSMENT/ PLAN:    Diabetes type 2   The patient's diabetes control appears to be overall better as judged by A1c which is now below 7% Overall glucose readings at home including fasting blood sugars are  better   He is doing  well with switching from [place].to [id number] mg Trulicity weekly He is getting minor GI side effects from this and advised to try OTC Zantac or Pepcid.  Getting Prandin only in the morning for covering his breakfast; also continuing his other regimen of Actos and metformin Also he will be able to do better with restarting exercise and was reminded about the benefits of this including his low HDL   Robbin Loughmiller 01/30/2014, 3:20 PM

## 2014-01-31 ENCOUNTER — Encounter: Payer: Self-pay | Admitting: Endocrinology

## 2014-02-02 ENCOUNTER — Encounter: Payer: Self-pay | Admitting: *Deleted

## 2014-03-06 ENCOUNTER — Other Ambulatory Visit: Payer: Self-pay | Admitting: Endocrinology

## 2014-04-07 ENCOUNTER — Other Ambulatory Visit: Payer: Self-pay | Admitting: *Deleted

## 2014-04-07 MED ORDER — DULAGLUTIDE 0.75 MG/0.5ML ~~LOC~~ SOAJ: SUBCUTANEOUS | Status: DC

## 2014-04-08 ENCOUNTER — Other Ambulatory Visit: Payer: Self-pay | Admitting: *Deleted

## 2014-04-08 MED ORDER — DULAGLUTIDE 0.75 MG/0.5ML ~~LOC~~ SOAJ: SUBCUTANEOUS | Status: DC

## 2014-05-01 ENCOUNTER — Other Ambulatory Visit (INDEPENDENT_AMBULATORY_CARE_PROVIDER_SITE_OTHER): Payer: 59

## 2014-05-01 DIAGNOSIS — R5381 Other malaise: Secondary | ICD-10-CM

## 2014-05-01 DIAGNOSIS — IMO0001 Reserved for inherently not codable concepts without codable children: Secondary | ICD-10-CM

## 2014-05-01 DIAGNOSIS — R5383 Other fatigue: Secondary | ICD-10-CM

## 2014-05-01 DIAGNOSIS — E1165 Type 2 diabetes mellitus with hyperglycemia: Principal | ICD-10-CM

## 2014-05-01 LAB — CBC
HCT: 43.5 % (ref 39.0–52.0)
Hemoglobin: 14.7 g/dL (ref 13.0–17.0)
MCHC: 33.7 g/dL (ref 30.0–36.0)
MCV: 85.9 fl (ref 78.0–100.0)
PLATELETS: 262 10*3/uL (ref 150.0–400.0)
RBC: 5.06 Mil/uL (ref 4.22–5.81)
RDW: 13.9 % (ref 11.5–15.5)
WBC: 7.8 10*3/uL (ref 4.0–10.5)

## 2014-05-01 LAB — URINALYSIS, ROUTINE W REFLEX MICROSCOPIC
Bilirubin Urine: NEGATIVE
Hgb urine dipstick: NEGATIVE
KETONES UR: NEGATIVE
Leukocytes, UA: NEGATIVE
Nitrite: NEGATIVE
Specific Gravity, Urine: >=1.03 — AB (ref 1.000–1.030)
TOTAL PROTEIN, URINE-UPE24: NEGATIVE
URINE GLUCOSE: NEGATIVE
Urobilinogen, UA: 0.2 (ref 0.0–1.0)
pH: 5.5 (ref 5.0–8.0)

## 2014-05-01 LAB — COMPREHENSIVE METABOLIC PANEL
ALT: 18 U/L (ref 0–53)
AST: 20 U/L (ref 0–37)
Albumin: 4.1 g/dL (ref 3.5–5.2)
Alkaline Phosphatase: 54 U/L (ref 39–117)
BUN: 24 mg/dL — AB (ref 6–23)
CO2: 27 meq/L (ref 19–32)
CREATININE: 1.1 mg/dL (ref 0.4–1.5)
Calcium: 9.4 mg/dL (ref 8.4–10.5)
Chloride: 110 mEq/L (ref 96–112)
GFR: 73.03 mL/min (ref >60.00)
Glucose, Bld: 115 mg/dL — ABNORMAL HIGH (ref 70–99)
Potassium: 4.3 mEq/L (ref 3.5–5.1)
Sodium: 142 mEq/L (ref 135–145)
Total Bilirubin: 0.8 mg/dL (ref 0.2–1.2)
Total Protein: 6.6 g/dL (ref 6.0–8.3)

## 2014-05-01 LAB — HEMOGLOBIN A1C: Hgb A1c MFr Bld: 6.3 % (ref 4.6–6.5)

## 2014-05-05 ENCOUNTER — Ambulatory Visit (INDEPENDENT_AMBULATORY_CARE_PROVIDER_SITE_OTHER): Payer: 59 | Admitting: Endocrinology

## 2014-05-05 ENCOUNTER — Encounter: Payer: Self-pay | Admitting: Endocrinology

## 2014-05-05 VITALS — BP 116/70 | HR 66 | Temp 98.1°F | Resp 16 | Ht 69.0 in | Wt 182.0 lb

## 2014-05-05 DIAGNOSIS — E119 Type 2 diabetes mellitus without complications: Secondary | ICD-10-CM

## 2014-05-05 DIAGNOSIS — E7800 Pure hypercholesterolemia, unspecified: Secondary | ICD-10-CM

## 2014-05-05 NOTE — Patient Instructions (Signed)
Coverage for Zostavax  Walk daily

## 2014-05-05 NOTE — Progress Notes (Addendum)
Patient ID: Joseph Sharp, male   DOB: 09-29-1951, 63 y.o.   MRN: [id number]   Reason for Appointment: Diabetes follow-up   History of Present Illness   Diagnosis: Type 2 DIABETES MELITUS, diagnosed in 1977     PAST history: He has had long-standing diabetes managed with multiple drugs and usually well controlled His blood sugar control is somewhat dependent on his compliance with diet and exercise regimen  RECENT history: In 11 /14 his blood sugars were higher overall and A1c was 7% which was higher than usual Because of this he was started on Trulicity injections, 0.03 mg With this he continues have overall better readings and his A1c has progressively come down to normal More recently he has had good blood sugars at home throughout the day He has done a good job of recently checking blood sugars at all different times and these are excellent now  He had high reading 1 morning only from noncompliance  No side effects from Trulicity which he is taking on Saturday evenings  ORAL hypoglycemic drugs: Prandin 1mg  in a.m., metformin, pioglitazone Side effects from medications: None  Monitors blood glucose: Once a day.    Glucometer:  FreeStyle         Blood Glucose readings recently from meter download:  PREMEAL Breakfast Lunch Dinner Bedtime Overall  Glucose range:  87-175   84, 116   77-118   96-118    Mean/median:  108      105    No hypoglycemia          Diet: Improving with smaller portions. Has a protein drink with oatmeal in am; some fast food EXERCISE: Has just started walking in ams        Wt Readings from Last 3 Encounters:  05/05/14 182 lb (82.555 kg)  01/30/14 185 lb 12.8 oz (84.278 kg)  11/18/13 185 lb 1.6 oz (83.961 kg)   Lab Results  Component Value Date   HGBA1C 6.3 05/01/2014   HGBA1C 6.6* 01/26/2014   HGBA1C 7.0* 09/19/2013   Lab Results  Component Value Date   MICROALBUR 0.50 01/26/2014   LDLCALC 65 01/26/2014   CREATININE 1.1 05/01/2014     Appointment on 05/01/2014  Component Date Value Ref Range Status  . Hemoglobin A1C 05/01/2014 6.3  4.6 - 6.5 % Final   Glycemic Control Guidelines for People with Diabetes:Non Diabetic:  <6%Goal of Therapy: <7%Additional Action Suggested:  >8%   . Sodium 05/01/2014 142  135 - 145 mEq/L Final  . Potassium 05/01/2014 4.3  3.5 - 5.1 mEq/L Final  . Chloride 05/01/2014 110  96 - 112 mEq/L Final  . CO2 05/01/2014 27  19 - 32 mEq/L Final  . Glucose, Bld 05/01/2014 115* 70 - 99 mg/dL Final  . BUN 05/01/2014 24* 6 - 23 mg/dL Final  . Creatinine, Ser 05/01/2014 1.1  0.4 - 1.5 mg/dL Final  . Total Bilirubin 05/01/2014 0.8  0.2 - 1.2 mg/dL Final  . Alkaline Phosphatase 05/01/2014 54  39 - 117 U/L Final  . AST 05/01/2014 20  0 - 37 U/L Final  . ALT 05/01/2014 18  0 - 53 U/L Final  . Total Protein 05/01/2014 6.6  6.0 - 8.3 g/dL Final  . Albumin 05/01/2014 4.1  3.5 - 5.2 g/dL Final  . Calcium 05/01/2014 9.4  8.4 - 10.5 mg/dL Final  . GFR 05/01/2014 73.03  >60.00 mL/min Final  . WBC 05/01/2014 7.8  4.0 - 10.5 K/uL Final  . RBC 05/01/2014  5.06  4.22 - 5.81 Mil/uL Final  . Platelets 05/01/2014 262.0  150.0 - 400.0 K/uL Final  . Hemoglobin 05/01/2014 14.7  13.0 - 17.0 g/dL Final  . HCT 05/01/2014 43.5  39.0 - 52.0 % Final  . MCV 05/01/2014 85.9  78.0 - 100.0 fl Final  . MCHC 05/01/2014 33.7  30.0 - 36.0 g/dL Final  . RDW 05/01/2014 13.9  11.5 - 15.5 % Final  . Color, Urine 05/01/2014 YELLOW  Yellow;Lt. Yellow Final  . APPearance 05/01/2014 CLEAR  Clear Final  . Specific Gravity, Urine 05/01/2014 >=1.030* 1.000 - 1.030 Final  . pH 05/01/2014 5.5  5.0 - 8.0 Final  . Total Protein, Urine 05/01/2014 NEGATIVE  Negative Final  . Urine Glucose 05/01/2014 NEGATIVE  Negative Final  . Ketones, ur 05/01/2014 NEGATIVE  Negative Final  . Bilirubin Urine 05/01/2014 NEGATIVE  Negative Final  . Hgb urine dipstick 05/01/2014 NEGATIVE  Negative Final  . Urobilinogen, UA 05/01/2014 0.2  0.0 - 1.0 Final  .  Leukocytes, UA 05/01/2014 NEGATIVE  Negative Final  . Nitrite 05/01/2014 NEGATIVE  Negative Final  . WBC, UA 05/01/2014 0-2/hpf  0-2/hpf Final  . RBC / HPF 05/01/2014 0-2/hpf  0-2/hpf Final  . Ca Oxalate Crys, UA 05/01/2014 Presence of* None Final      Medication List       This list is accurate as of: 05/05/14 11:31 AM.  Always use your most recent med list.               aspirin 81 MG tablet  Take 81 mg by mouth daily.     Azelastine HCl 0.15 % Soln     cholecalciferol 1000 UNITS tablet  Commonly known as:  VITAMIN D  Take 1,000 Units by mouth daily.     Dulaglutide 0.75 MG/0.5ML Sopn  Commonly known as:  TRULICITY  Inject into the skin once a week     EPIPEN 2-PAK 0.3 mg/0.3 mL Soaj injection  Generic drug:  EPINEPHrine     fluticasone 50 MCG/ACT nasal spray  Commonly known as:  FLONASE     latanoprost 0.005 % ophthalmic solution  Commonly known as:  XALATAN     lovastatin 20 MG tablet  Commonly known as:  MEVACOR  Take 20 mg by mouth every morning.     metFORMIN 500 MG 24 hr tablet  Commonly known as:  GLUCOPHAGE-XR  TAKE 4 TABLETS ONCE DAILY     pioglitazone 30 MG tablet  Commonly known as:  ACTOS  TAKE 1 TABLET ONCE DAILY     repaglinide 1 MG tablet  Commonly known as:  PRANDIN  TAKE  1 BEFORE BREAKFAST        Allergies: No Known Allergies  Past Medical History  Diagnosis Date  . Allergy   . Diabetes mellitus without complication     Past Surgical History  Procedure Laterality Date  . Eye surgery  01/2013    Eyelid surgery    Family History  Problem Relation Age of Onset  . Diabetes Mother   . Arthritis Mother   . Diabetes Brother     Social History:  reports that he has never smoked. He has never used smokeless tobacco. He reports that he does not drink alcohol or use illicit drugs.  Review of Systems:  No history of hypertension   HYPERLIPIDEMIA: The lipid abnormality consists of elevated LDL and low HDL, usually well  controlled with lovastatin   Lab Results  Component Value Date  CHOL 125 01/26/2014   HDL 38* 01/26/2014   LDLCALC 65 01/26/2014   TRIG 111 01/26/2014   CHOLHDL 3.3 01/26/2014    He previously had transient hypogonadism which subsequently had resolved  Preventive care: He needs a complete physical exam and Zostavax     Examination:   BP 116/70  Pulse 66  Temp(Src) 98.1 F (36.7 C)  Resp 16  Ht 5\' 9"  (1.753 m)  Wt 182 lb (82.555 kg)  BMI 26.86 kg/m2  SpO2 94%  Body mass index is 26.86 kg/(m^2).    ASSESSMENT/ PLAN:    Diabetes type 2   The patient's diabetes control appears to be overall excellent as judged by A1c which is now upper normal Overall glucose readings at home including fasting blood sugars are  better   He is doing  well with switching from [place].to 0.97 mg Trulicity weekly and has progressive improvement in his A1c No side effects from this now  Getting Prandin only in the morning for covering his breakfast; also continuing his other regimen of Actos and metformin Also he will be able to do better with increasing exercise and was reminded about the benefits of this  To have physical and Zostavax on his next visit   KUMAR,AJAY 05/05/2014, 11:31 AM

## 2014-06-10 ENCOUNTER — Other Ambulatory Visit: Payer: Self-pay | Admitting: *Deleted

## 2014-06-10 ENCOUNTER — Ambulatory Visit (INDEPENDENT_AMBULATORY_CARE_PROVIDER_SITE_OTHER): Payer: 59 | Admitting: Family Medicine

## 2014-06-10 VITALS — BP 114/62 | HR 84 | Temp 97.8°F | Resp 16 | Ht 69.0 in | Wt 181.8 lb

## 2014-06-10 DIAGNOSIS — M7022 Olecranon bursitis, left elbow: Secondary | ICD-10-CM

## 2014-06-10 DIAGNOSIS — M7020 Olecranon bursitis, unspecified elbow: Secondary | ICD-10-CM

## 2014-06-10 MED ORDER — DICLOFENAC SODIUM 75 MG PO TBEC: 75.0000 mg | DELAYED_RELEASE_TABLET | Freq: Two times a day (BID) | ORAL | Status: DC

## 2014-06-10 MED ORDER — LOVASTATIN 20 MG PO TABS: 20.0000 mg | ORAL_TABLET | Freq: Every morning | ORAL | Status: DC

## 2014-06-10 NOTE — Patient Instructions (Signed)
Olecranon Bursitis Bursitis is swelling and soreness (inflammation) of a fluid-filled sac (bursa) that covers and protects a joint. Olecranon bursitis occurs over the elbow.  CAUSES Bursitis can be caused by injury, overuse of the joint, arthritis, or infection.  SYMPTOMS   Tenderness, swelling, warmth, or redness over the elbow.  Elbow pain with movement. This is greater with bending the elbow.  Squeaking sound when the bursa is rubbed or moved.  Increasing size of the bursa without pain or discomfort.  Fever with increasing pain and swelling if the bursa becomes infected. HOME CARE INSTRUCTIONS   Put ice on the affected area.  Put ice in a plastic bag.  Place a towel between your skin and the bag.  Leave the ice on for 15-20 minutes each hour while awake. Do this for the first 2 days.  When resting, elevate your elbow above the level of your heart. This helps reduce swelling.  Continue to put the joint through a full range of motion 4 times per day. Rest the injured joint at other times. When the pain lessens, begin normal slow movements and usual activities.  Only take over-the-counter or prescription medicines for pain, discomfort, or fever as directed by your caregiver.  Reduce your intake of milk and related dairy products (cheese, yogurt). They may make your condition worse. SEEK IMMEDIATE MEDICAL CARE IF:   Your pain increases even during treatment.  You have a fever.  You have heat and inflammation over the bursa and elbow.  You have a red line that goes up your arm.  You have pain with movement of your elbow. MAKE SURE YOU:   Understand these instructions.  Will watch your condition.  Will get help right away if you are not doing well or get worse. Document Released: 11/29/2006 Document Revised: 01/22/2012 Document Reviewed: 10/15/2007 Exit[employer] Patient Information 2015 [employer]. This information is not intended to replace advice given to you by your  health care provider. Make sure you discuss any questions you have with your health care provider.  

## 2014-06-10 NOTE — Progress Notes (Signed)
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 It began about 2 months ago and it's been intermittent. He has no history of trauma.  Patient says that when he puts his elbow down on the table symptoms of an acute sharp pain. He's noticed no swelling or difficulty with range of motion.  The patient is tried no medicine for this. He has diabetes, type II with his last hemoglobin A1c being 6.3  Objective: No acute distress Inspection of the left elbow reveals no abnormalities. Patient has full range of motion of left elbow but he is tender right over the olecranon. Skin: No rash  Assessment: Olecranon bursitis, chronic. This appears to be a minor problem and is intermittent  Olecranon bursitis, left - Plan: diclofenac (VOLTAREN) 75 MG EC tablet  Signed, Robyn Haber, MD

## 2014-08-10 ENCOUNTER — Encounter: Payer: Self-pay | Admitting: Physician Assistant

## 2014-08-10 DIAGNOSIS — J3089 Other allergic rhinitis: Secondary | ICD-10-CM

## 2014-08-10 DIAGNOSIS — J309 Allergic rhinitis, unspecified: Secondary | ICD-10-CM | POA: Insufficient documentation

## 2014-08-16 ENCOUNTER — Other Ambulatory Visit: Payer: Self-pay | Admitting: Endocrinology

## 2014-08-30 ENCOUNTER — Other Ambulatory Visit: Payer: Self-pay | Admitting: Endocrinology

## 2014-09-01 ENCOUNTER — Other Ambulatory Visit (INDEPENDENT_AMBULATORY_CARE_PROVIDER_SITE_OTHER): Payer: 59

## 2014-09-01 DIAGNOSIS — E119 Type 2 diabetes mellitus without complications: Secondary | ICD-10-CM

## 2014-09-01 LAB — BASIC METABOLIC PANEL
BUN: 17 mg/dL (ref 6–23)
CALCIUM: 9.2 mg/dL (ref 8.4–10.5)
CHLORIDE: 108 meq/L (ref 96–112)
CO2: 27 meq/L (ref 19–32)
Creatinine, Ser: 1 mg/dL (ref 0.4–1.5)
GFR: 83.46 mL/min (ref >60.00)
GLUCOSE: 157 mg/dL — AB (ref 70–99)
Potassium: 3.9 mEq/L (ref 3.5–5.1)
SODIUM: 140 meq/L (ref 135–145)

## 2014-09-01 LAB — HEMOGLOBIN A1C: Hgb A1c MFr Bld: 6.6 % — ABNORMAL HIGH (ref 4.6–6.5)

## 2014-09-04 ENCOUNTER — Ambulatory Visit (INDEPENDENT_AMBULATORY_CARE_PROVIDER_SITE_OTHER): Payer: 59 | Admitting: Endocrinology

## 2014-09-04 ENCOUNTER — Encounter: Payer: Self-pay | Admitting: Endocrinology

## 2014-09-04 ENCOUNTER — Other Ambulatory Visit: Payer: Self-pay | Admitting: *Deleted

## 2014-09-04 VITALS — BP 110/76 | HR 91 | Temp 98.0°F | Resp 14 | Ht 69.0 in | Wt 183.2 lb

## 2014-09-04 DIAGNOSIS — E119 Type 2 diabetes mellitus without complications: Secondary | ICD-10-CM

## 2014-09-04 DIAGNOSIS — Z0000 Encounter for general adult medical examination without abnormal findings: Secondary | ICD-10-CM

## 2014-09-04 DIAGNOSIS — N528 Other male erectile dysfunction: Secondary | ICD-10-CM

## 2014-09-04 DIAGNOSIS — R5382 Chronic fatigue, unspecified: Secondary | ICD-10-CM

## 2014-09-04 DIAGNOSIS — Z23 Encounter for immunization: Secondary | ICD-10-CM

## 2014-09-04 DIAGNOSIS — N62 Hypertrophy of breast: Secondary | ICD-10-CM

## 2014-09-04 DIAGNOSIS — N529 Male erectile dysfunction, unspecified: Secondary | ICD-10-CM | POA: Insufficient documentation

## 2014-09-04 MED ORDER — GLUCOSE BLOOD VI STRP: ORAL_STRIP | Status: DC

## 2014-09-04 MED ORDER — TADALAFIL 20 MG PO TABS: 20.0000 mg | ORAL_TABLET | Freq: Every day | ORAL | Status: DC | PRN

## 2014-09-04 NOTE — Patient Instructions (Signed)
Walk daily  Avoid instant packets of oatmeal  Please check blood sugars at least half the time about 2 hours after any meal and 3 times per week on waking up. Please bring blood sugar monitor to each visit

## 2014-09-04 NOTE — Progress Notes (Signed)
Patient ID: Joseph Sharp, male   DOB: 10-08-1951, 63 y.o.   MRN: [id number]   Reason for Appointment: Diabetes follow-up and complete physical exam  History of Present Illness   Diagnosis: Type 2 DIABETES MELITUS, diagnosed in 1977     PAST history: He has had long-standing diabetes managed with multiple drugs and usually well controlled His blood sugar control is somewhat dependent on his compliance with diet and exercise regimen  In 11 /14 his blood sugars were higher overall and A1c was 7% which was higher than usual Because of this he was started on Trulicity injections, 2.42 mg  RECENT history: With Trulicity continues have overall good control and his A1c has stayed under 7% at least On his download today he has had good blood sugars at home most of the time He does have high readings after breakfast and he is getting an instant oatmeal with his protein drink Blood sugars do get back to normal by lunch time Will have occasional relatively higher readings after dinner but not checking these as often Blood sugars in the afternoons are recently better  No side effects from Trulicity which he is taking on Saturday evenings  ORAL hypoglycemic drugs: Prandin 1mg  in a.m., metformin, pioglitazone Side effects from medications: None  Monitors blood glucose: Once a day.    Glucometer:  FreeStyle         Blood Glucose readings recently from meter download:  PREMEAL Breakfast Lunch Dinner Bedtime Overall  Glucose range:  114-188   83-118   84-141     Mean/median:  136      129    POST-MEAL PC Breakfast PC Lunch PC Dinner  Glucose range:  161-215   95   130-171   Mean/median:      No hypoglycemia          Diet: Usually controlled with smaller portions. Has a protein drink with instant oatmeal in am; some fast food EXERCISE: Has not been walking       Wt Readings from Last 3 Encounters:  09/04/14 183 lb 3.2 oz (83.099 kg)  06/10/14 181 lb 12.8 oz (82.464 kg)  05/05/14 182  lb (82.555 kg)   Lab Results  Component Value Date   HGBA1C 6.6* 09/01/2014   HGBA1C 6.3 05/01/2014   HGBA1C 6.6* 01/26/2014   Lab Results  Component Value Date   MICROALBUR 0.50 01/26/2014   LDLCALC 65 01/26/2014   CREATININE 1.0 09/01/2014    Appointment on 09/01/2014  Component Date Value Ref Range Status  . Sodium 09/01/2014 140  135 - 145 mEq/L Final  . Potassium 09/01/2014 3.9  3.5 - 5.1 mEq/L Final  . Chloride 09/01/2014 108  96 - 112 mEq/L Final  . CO2 09/01/2014 27  19 - 32 mEq/L Final  . Glucose, Bld 09/01/2014 157* 70 - 99 mg/dL Final  . BUN 09/01/2014 17  6 - 23 mg/dL Final  . Creatinine, Ser 09/01/2014 1.0  0.4 - 1.5 mg/dL Final  . Calcium 09/01/2014 9.2  8.4 - 10.5 mg/dL Final  . GFR 09/01/2014 83.46  >60.00 mL/min Final  . Hemoglobin A1C 09/01/2014 6.6* 4.6 - 6.5 % Final   Glycemic Control Guidelines for People with Diabetes:Non Diabetic:  <6%Goal of Therapy: <7%Additional Action Suggested:  >8%       Medication List       This list is accurate as of: 09/04/14 10:47 AM.  Always use your most recent med list.  aspirin 81 MG tablet  Take 81 mg by mouth daily.     Azelastine HCl 0.15 % Soln     cholecalciferol 1000 UNITS tablet  Commonly known as:  VITAMIN D  Take 1,000 Units by mouth daily.     Dulaglutide 0.75 MG/0.5ML Sopn  Commonly known as:  TRULICITY  Inject into the skin once a week     EPIPEN 2-PAK 0.3 mg/0.3 mL Soaj injection  Generic drug:  EPINEPHrine     fluticasone 50 MCG/ACT nasal spray  Commonly known as:  FLONASE     lovastatin 20 MG tablet  Commonly known as:  MEVACOR  TAKE 1 TABLET (20 MG TOTAL) BY MOUTH EVERY MORNING.     metFORMIN 500 MG 24 hr tablet  Commonly known as:  GLUCOPHAGE-XR  TAKE 4 TABLETS ONCE DAILY     pioglitazone 30 MG tablet  Commonly known as:  ACTOS  TAKE 1 TABLET ONCE DAILY     repaglinide 1 MG tablet  Commonly known as:  PRANDIN  TAKE  1 BEFORE BREAKFAST        Allergies: No  Known Allergies  Past Medical History  Diagnosis Date  . Allergy   . Diabetes mellitus without complication     Past Surgical History  Procedure Laterality Date  . Eye surgery  01/2013    Eyelid surgery    Family History  Problem Relation Age of Onset  . Diabetes Mother   . Arthritis Mother   . Diabetes Brother     Social History:  reports that he has never smoked. He has never used smokeless tobacco. He reports that he does not drink alcohol or use illicit drugs.  Review of Systems:   PREVENTIVE CARE:  Aspirin: 81 mg  Lipids:                              LDL 65   Colonoscopy  09/2010  PSA:  Not indicated   Yearly flu vaccine:                     Yes Pneumovax: 2/09  Eye exams: 3/15  Zostavax:   done today     No history of hypertension, may have mild whitecoat increase in blood pressure   HYPERLIPIDEMIA: The lipid abnormality consists of elevated LDL and triglycerides and low HDL, usually well controlled with lovastatin except for low HDL   Lab Results  Component Value Date   CHOL 125 01/26/2014   HDL 38* 01/26/2014   LDLCALC 65 01/26/2014   TRIG 111 01/26/2014   CHOLHDL 3.3 01/26/2014    He previously had transient hypogonadotropic hypogonadism which subsequently had resolved. Was temporarily treated with AndroGel. He does have decreased libido     No unusual headaches.     ENT: Has had allergic rhinitis, using Claritin and Astelin from allergist, on shots for several years with some improvement     Skin: No rash or infections     Thyroid:  has been a little more tired recently. Does have chronic cold intolerance. Previous thyroid levels have usually been normal     No swelling of feet.     No shortness of breath or tightness of the chest on exertion.     Bowel habits: no change.  No heartburn or abdominal pain        No joint pains.          Usually no  nocturia.  No frequency although stream is slightly slow.  Erectile dysfunction: He has had this for  several years and usually needs Cialis infrequently, has previously used 20 mg    Has occasional insomnia , more in the last 2 weeks but no restless legs  No history of snoring but may occasionally have mild daytime somnolence.     Examination:   BP 126/86  Pulse 91  Temp(Src) 98 F (36.7 C)  Resp 14  Ht 5\' 9"  (1.753 m)  Wt 183 lb 3.2 oz (83.099 kg)  BMI 27.04 kg/m2  SpO2 97%  Body mass index is 27.04 kg/(m^2).   GENERAL: average build, well-nourished, pleasant and cooperative, well-groomed   No pallor, clubbing, lymphadenopathy or edema.  Skin:  no rash or pigmentation.  EYES:  Externally normal.  Fundii:  normal discs and vessels.  ENT: Oropharynx normal, tongue normal and not enlarged. No mucosal lesions  THYROID:  Not palpable.  CAROTIDS:  Normal character; no bruit.  HEART:  Normal apex, S1 and S2; no murmur or click.  CHEST:  Normal shape.  Lungs:   Vescicular breath sounds heard equally.  No crepitations/ wheeze. Had mild gynecomastia bilaterally  ABDOMEN:  No distention.  Liver and spleen not palpable.  No other mass or tenderness.   RECTAL exam:  prostate only minimally enlarged and smooth. Rectal exam otherwise normal  NEUROLOGICAL:. Diabetic foot exam shows normal monofilament sensation in the toes and plantar surfaces, no skin lesions or ulcers on the feet and normal pedal pulses  Vibration sense at toes is mildly reduced on the right and moderately on the left.  Reflexes are 2+ bilaterally at biceps and minimal at ankles.    SPINE AND JOINTS:  Normal.   ASSESSMENT/ PLAN:    Diabetes type 2   The patient's diabetes control appears to be overall fairly good as judged by A1c which is near normal although slightly higher than on his last visit Overall glucose readings at home including fasting blood sugars are only mildly increased Highest readings are after breakfast and advised him to change instant oatmeal to the regular oatmeal He does have difficulty  with consistent compliance with his walking program and emphasized the need for this regularly Also to check blood sugars more after dinner He is taking Prandin only in the morning for covering his breakfast; also continuing his other regimen of Actos, Trulicity and metformin  COMPLICATIONS: Minimal evidence of neuropathy and has no history of increased microalbumin or adenopathy  Erectile dysfunction: Mild and stable, he will continue to use Cialis as needed  ? Hypogonadism: He has decreased libido and some gynecomastia on exam. Will check testosterone level on the next visit  FATIGUE: He has this relatively chronically and previously not related to thyroid disease or hypogonadism. He does have some insomnia but does not have other typical symptoms of sleep apnea. He is reluctant to have screening for sleep apnea  HYPERLIPIDEMIA: He will continue lovastatin and have lipids on his next visit  Allergic rhinitis: Mostly seasonal and managed with Astelin and Claritin as needed, followed by allergist  Preventive care:  Discussed heart healthy diet To continue aspirin Colonoscopy not due till 2021 Stool Hemoccult to be done annually Zostavax and influenza vaccine today Reminded him about safety measures like seatbelts Regular walking for exercise   Breeley Bischof 09/04/2014, 10:47 AM

## 2014-10-18 ENCOUNTER — Other Ambulatory Visit: Payer: Self-pay | Admitting: Endocrinology

## 2014-11-03 ENCOUNTER — Other Ambulatory Visit: Payer: Self-pay | Admitting: Endocrinology

## 2015-01-05 ENCOUNTER — Other Ambulatory Visit (INDEPENDENT_AMBULATORY_CARE_PROVIDER_SITE_OTHER): Payer: 59

## 2015-01-05 DIAGNOSIS — R5382 Chronic fatigue, unspecified: Secondary | ICD-10-CM

## 2015-01-05 DIAGNOSIS — N529 Male erectile dysfunction, unspecified: Secondary | ICD-10-CM

## 2015-01-05 DIAGNOSIS — N62 Hypertrophy of breast: Secondary | ICD-10-CM

## 2015-01-05 DIAGNOSIS — E119 Type 2 diabetes mellitus without complications: Secondary | ICD-10-CM

## 2015-01-05 LAB — LIPID PANEL
CHOLESTEROL: 124 mg/dL (ref 0–200)
HDL: 38.3 mg/dL — AB (ref >39.00)
LDL CALC: 62 mg/dL (ref 0–99)
NonHDL: 85.7
TRIGLYCERIDES: 120 mg/dL (ref 0.0–149.0)
Total CHOL/HDL Ratio: 3
VLDL: 24 mg/dL (ref 0.0–40.0)

## 2015-01-05 LAB — COMPREHENSIVE METABOLIC PANEL
ALK PHOS: 57 U/L (ref 39–117)
ALT: 19 U/L (ref 0–53)
AST: 14 U/L (ref 0–37)
Albumin: 4.1 g/dL (ref 3.5–5.2)
BUN: 19 mg/dL (ref 6–23)
CO2: 27 mEq/L (ref 19–32)
Calcium: 9.6 mg/dL (ref 8.4–10.5)
Chloride: 109 mEq/L (ref 96–112)
Creatinine, Ser: 1.08 mg/dL (ref 0.40–1.50)
GFR: 73.65 mL/min (ref >60.00)
GLUCOSE: 141 mg/dL — AB (ref 70–99)
Potassium: 4.5 mEq/L (ref 3.5–5.1)
Sodium: 141 mEq/L (ref 135–145)
TOTAL PROTEIN: 6.5 g/dL (ref 6.0–8.3)
Total Bilirubin: 0.5 mg/dL (ref 0.2–1.2)

## 2015-01-05 LAB — CBC
HEMATOCRIT: 43.7 % (ref 39.0–52.0)
Hemoglobin: 14.8 g/dL (ref 13.0–17.0)
MCHC: 33.9 g/dL (ref 30.0–36.0)
MCV: 84.7 fl (ref 78.0–100.0)
Platelets: 249 10*3/uL (ref 150.0–400.0)
RBC: 5.16 Mil/uL (ref 4.22–5.81)
RDW: 13.4 % (ref 11.5–15.5)
WBC: 7.6 10*3/uL (ref 4.0–10.5)

## 2015-01-05 LAB — MICROALBUMIN / CREATININE URINE RATIO
Creatinine,U: 204 mg/dL
MICROALB UR: 1.8 mg/dL (ref 0.0–1.9)
Microalb Creat Ratio: 0.9 mg/g (ref 0.0–30.0)

## 2015-01-05 LAB — HEMOGLOBIN A1C: Hgb A1c MFr Bld: 7 % — ABNORMAL HIGH (ref 4.6–6.5)

## 2015-01-07 LAB — TESTOSTERONE, FREE, TOTAL, SHBG
Testosterone, Free: 14.6 pg/mL (ref 6.6–18.1)
Testosterone, total: 381.2 ng/dL (ref 348.0–1197.0)

## 2015-01-08 ENCOUNTER — Other Ambulatory Visit: Payer: 59

## 2015-01-08 ENCOUNTER — Ambulatory Visit (INDEPENDENT_AMBULATORY_CARE_PROVIDER_SITE_OTHER): Payer: 59 | Admitting: Endocrinology

## 2015-01-08 ENCOUNTER — Encounter: Payer: Self-pay | Admitting: Endocrinology

## 2015-01-08 VITALS — BP 110/80 | HR 84 | Temp 98.5°F | Wt 186.0 lb

## 2015-01-08 DIAGNOSIS — IMO0002 Reserved for concepts with insufficient information to code with codable children: Secondary | ICD-10-CM

## 2015-01-08 DIAGNOSIS — E1165 Type 2 diabetes mellitus with hyperglycemia: Secondary | ICD-10-CM

## 2015-01-08 DIAGNOSIS — E785 Hyperlipidemia, unspecified: Secondary | ICD-10-CM

## 2015-01-08 NOTE — Patient Instructions (Signed)
Please check blood sugars at least half the time about 2 hours after any meal and 3 times per week on waking up. Please bring blood sugar monitor to each visit. Recommended blood sugar levels about 2 hours after meal is 140-160 and on waking up 90-130  Walk daily    INDOOR EXERCISE IDEAS   Here's an example of a creative, compact workout (perform each move for 2-3 minutes): Marland Kitchen Warm up. Put on some music that makes you feel like moving, and dance around the living room. . Watch exercise shows on TV and move along with them. You don't have to invest in a lot of exercise videos if your budget is strapped. There are tons of free cable channels that have daily exercise shows on them for all levels - beginner through advanced. . Walk up and down the steps. . Do dumbbell curls and presses (if you don't have weights, use full water bottles). . Do assisted squats, keeping your back on a fitness ball against the wall or using the back of the couch for support. . Shadow box: Lift and lower the left leg; jab with the right arm, then the left; then lift and lower the right leg. Marland Kitchen Fence (you don't even need swords). Pretend you're holding a sword in each hand. Create an X pattern standing still, then moving forward and back. . Hop on your exercise bike or treadmill -- or, for something different, use a weighted hula hoop. If you don't have any of those, just go back to dancing. . Do abdominal crunches (hold a weighted ball for added resistance). Marland Kitchen Cool down with [employer] "I Feel Good" -- or whatever tune makes you feel good .

## 2015-01-08 NOTE — Progress Notes (Signed)
Pre visit review using our clinic review tool, if applicable. No additional management support is needed unless otherwise documented below in the visit note. 

## 2015-01-08 NOTE — Progress Notes (Signed)
Patient ID: Joseph Sharp, male   DOB: [birth year], 64 y.o.   MRN: [id number]   Reason for Appointment: Diabetes follow-up and complete physical exam  History of Present Illness   Diagnosis: Type 2 DIABETES MELITUS, diagnosed in 1977     PAST history: He has had long-standing diabetes managed with multiple drugs and usually well controlled His blood sugar control is somewhat dependent on his compliance with diet and exercise regimen  In 11 /14 his blood sugars were higher overall and A1c was 7% which was higher than usual Because of this he was started on Trulicity injections, 7.40 mg  RECENT history: With 8.14 mg Trulicity he continues have overall good control and his A1c has stayed at or under 7%   No side effects from Trulicity which he is taking on Saturday evenings. He did not bring his monitor for download today and he does not think he has checked his sugar much He thinks because of stress at work he has not been able to find time to exercise, check his sugar or watch his diet consistently and has gained weight also. Usually tends to have higher readings before breakfast and after. Is also taking 3 oral medications  ORAL hypoglycemic drugs: Prandin 1mg  in a.m., metformin 2g, pioglitazone Side effects from medications: None  Monitors blood glucose: rarely    Glucometer:  FreeStyle         Blood Glucose readings recently: none   No hypoglycemia          Diet: Usually controlled with smaller portions. Has a protein drink with instant oatmeal in am; some fast food periodically EXERCISE: Has not been walking       Wt Readings from Last 3 Encounters:  01/08/15 186 lb (84.369 kg)  09/04/14 183 lb 3.2 oz (83.099 kg)  06/10/14 181 lb 12.8 oz (82.464 kg)   Lab Results  Component Value Date   HGBA1C 7.0* 01/05/2015   HGBA1C 6.6* 09/01/2014   HGBA1C 6.3 05/01/2014   Lab Results  Component Value Date   MICROALBUR 1.8 01/05/2015   LDLCALC 62 01/05/2015   CREATININE 1.08 01/05/2015    Appointment on 01/05/2015  Component Date Value Ref Range Status  . Hgb A1c MFr Bld 01/05/2015 7.0* 4.6 - 6.5 % Final   Glycemic Control Guidelines for People with Diabetes:Non Diabetic:  <6%Goal of Therapy: <7%Additional Action Suggested:  >8%   . Sodium 01/05/2015 141  135 - 145 mEq/L Final  . Potassium 01/05/2015 4.5  3.5 - 5.1 mEq/L Final  . Chloride 01/05/2015 109  96 - 112 mEq/L Final  . CO2 01/05/2015 27  19 - 32 mEq/L Final  . Glucose, Bld 01/05/2015 141* 70 - 99 mg/dL Final  . BUN 01/05/2015 19  6 - 23 mg/dL Final  . Creatinine, Ser 01/05/2015 1.08  0.40 - 1.50 mg/dL Final  . Total Bilirubin 01/05/2015 0.5  0.2 - 1.2 mg/dL Final  . Alkaline Phosphatase 01/05/2015 57  39 - 117 U/L Final  . AST 01/05/2015 14  0 - 37 U/L Final  . ALT 01/05/2015 19  0 - 53 U/L Final  . Total Protein 01/05/2015 6.5  6.0 - 8.3 g/dL Final  . Albumin 01/05/2015 4.1  3.5 - 5.2 g/dL Final  . Calcium 01/05/2015 9.6  8.4 - 10.5 mg/dL Final  . GFR 01/05/2015 73.65  >60.00 mL/min Final  . Cholesterol 01/05/2015 124  0 - 200 mg/dL Final   ATP III Classification  Desirable:  < 200 mg/dL               Borderline High:  200 - 239 mg/dL          High:  > = 240 mg/dL  . Triglycerides 01/05/2015 120.0  0.0 - 149.0 mg/dL Final   Normal:  <150 mg/dLBorderline High:  150 - 199 mg/dL  . HDL 01/05/2015 38.30* >39.00 mg/dL Final  . VLDL 01/05/2015 24.0  0.0 - 40.0 mg/dL Final  . LDL Cholesterol 01/05/2015 62  0 - 99 mg/dL Final  . Total CHOL/HDL Ratio 01/05/2015 3   Final                  Men          Women1/2 Average Risk     3.4          3.3Average Risk          5.0          4.42X Average Risk          9.6          7.13X Average Risk          15.0          11.0                      . NonHDL 01/05/2015 85.70   Final   NOTE:  Non-HDL goal should be 30 mg/dL higher than patient's LDL goal (i.e. LDL goal of < 70 mg/dL, would have non-HDL goal of < 100 mg/dL)  . Testosterone, total  01/05/2015 381.2  348.0 - 1197.0 ng/dL Final  . Testosterone, Free 01/05/2015 14.6  6.6 - 18.1 pg/mL Final  . WBC 01/05/2015 7.6  4.0 - 10.5 K/uL Final  . RBC 01/05/2015 5.16  4.22 - 5.81 Mil/uL Final  . Platelets 01/05/2015 249.0  150.0 - 400.0 K/uL Final  . Hemoglobin 01/05/2015 14.8  13.0 - 17.0 g/dL Final  . HCT 01/05/2015 43.7  39.0 - 52.0 % Final  . MCV 01/05/2015 84.7  78.0 - 100.0 fl Final  . MCHC 01/05/2015 33.9  30.0 - 36.0 g/dL Final  . RDW 01/05/2015 13.4  11.5 - 15.5 % Final  . Microalb, Ur 01/05/2015 1.8  0.0 - 1.9 mg/dL Final  . Creatinine,U 01/05/2015 204.0   Final  . Microalb Creat Ratio 01/05/2015 0.9  0.0 - 30.0 mg/g Final      Medication List       This list is accurate as of: 01/08/15  8:51 AM.  Always use your most recent med list.               aspirin 81 MG tablet  Take 81 mg by mouth daily.     Azelastine HCl 0.15 % Soln     cholecalciferol 1000 UNITS tablet  Commonly known as:  VITAMIN D  Take 1,000 Units by mouth daily.     EPIPEN 2-PAK 0.3 mg/0.3 mL Soaj injection  Generic drug:  EPINEPHrine     fluticasone 50 MCG/ACT nasal spray  Commonly known as:  FLONASE     glucose blood test strip  Commonly known as:  FREESTYLE LITE  Use as instructed to check blood sugar once a day dx code E11.9     lovastatin 20 MG tablet  Commonly known as:  MEVACOR  TAKE 1 TABLET (20 MG TOTAL) BY MOUTH EVERY MORNING.     metFORMIN 500 MG 24 hr  tablet  Commonly known as:  GLUCOPHAGE-XR  TAKE 4 TABLETS ONCE DAILY     pioglitazone 30 MG tablet  Commonly known as:  ACTOS  TAKE 1 TABLET ONCE DAILY     repaglinide 1 MG tablet  Commonly known as:  PRANDIN  TAKE  1 BEFORE BREAKFAST     tadalafil 20 MG tablet  Commonly known as:  CIALIS  Take 1 tablet (20 mg total) by mouth daily as needed for erectile dysfunction.     TRULICITY 9.39 QZ/0.0PQ Sopn  Generic drug:  Dulaglutide  INJECT INTO THE SKIN ONCE A WEEK        Allergies: No Known Allergies  Past  Medical History  Diagnosis Date  . Allergy   . Diabetes mellitus without complication     Past Surgical History  Procedure Laterality Date  . Eye surgery  01/2013    Eyelid surgery    Family History  Problem Relation Age of Onset  . Diabetes Mother   . Arthritis Mother   . Diabetes Brother   . Cancer Neg Hx   . Heart disease Neg Hx     Social History:  reports that he has never smoked. He has never used smokeless tobacco. He reports that he does not drink alcohol or use illicit drugs.  Review of Systems:   HYPERLIPIDEMIA: The lipid abnormality consists of elevated LDL and triglycerides and low HDL, usually well controlled with lovastatin except for low HDL   Lab Results  Component Value Date   CHOL 124 01/05/2015   HDL 38.30* 01/05/2015   LDLCALC 62 01/05/2015   TRIG 120.0 01/05/2015   CHOLHDL 3 01/05/2015    He previously had transient hypogonadotropic hypogonadism which subsequently had resolved.  Was previously treated with AndroGel.  He does have decreased libido and mild gynecomastia on exam but testosterone level is normal now    Lab Results  Component Value Date   TESTOSTERONE 381.2 01/05/2015    Last foot exam in 10/15 was normal.  He has not done stool Hemoccult recently     Examination:   BP 110/80 mmHg  Pulse 84  Temp(Src) 98.5 F (36.9 C) (Oral)  Wt 186 lb (84.369 kg)  Body mass index is 27.45 kg/(m^2).     ASSESSMENT/ PLAN:    Diabetes type 2   The patient's diabetes control appears to be slightly worse with A1c trending higher This is probably because of his lack of exercise, inconsistent diet and stress Has not checked his blood sugar although lab glucose was slightly high at 141 fasting  Have given him a exercise regimen to do even when he is not able to walk at lunchtime He will start monitoring his blood sugar consistently and call if staying high  Hyperlipidemia: Controlled except for relatively low HDL.  Needs exercise and  weight loss  Preventive care:  Stool Hemoccult was given today   Francheska Villeda 01/08/2015, 8:51 AM

## 2015-02-02 ENCOUNTER — Other Ambulatory Visit: Payer: Self-pay | Admitting: Endocrinology

## 2015-02-17 LAB — HM DIABETES EYE EXAM

## 2015-02-19 ENCOUNTER — Other Ambulatory Visit: Payer: Self-pay | Admitting: Endocrinology

## 2015-03-13 ENCOUNTER — Other Ambulatory Visit: Payer: Self-pay | Admitting: Endocrinology

## 2015-03-22 ENCOUNTER — Encounter: Payer: Self-pay | Admitting: Endocrinology

## 2015-03-22 ENCOUNTER — Other Ambulatory Visit: Payer: 59

## 2015-03-23 ENCOUNTER — Other Ambulatory Visit: Payer: Self-pay

## 2015-03-23 ENCOUNTER — Other Ambulatory Visit: Payer: 59

## 2015-03-24 ENCOUNTER — Other Ambulatory Visit: Payer: Self-pay | Admitting: *Deleted

## 2015-03-24 ENCOUNTER — Other Ambulatory Visit (INDEPENDENT_AMBULATORY_CARE_PROVIDER_SITE_OTHER): Payer: 59

## 2015-03-24 DIAGNOSIS — Z1211 Encounter for screening for malignant neoplasm of colon: Secondary | ICD-10-CM

## 2015-03-24 LAB — FECAL OCCULT BLOOD, IMMUNOCHEMICAL: Fecal Occult Bld: NEGATIVE

## 2015-04-04 ENCOUNTER — Other Ambulatory Visit: Payer: Self-pay | Admitting: Endocrinology

## 2015-04-05 ENCOUNTER — Other Ambulatory Visit (INDEPENDENT_AMBULATORY_CARE_PROVIDER_SITE_OTHER): Payer: 59

## 2015-04-05 DIAGNOSIS — E1165 Type 2 diabetes mellitus with hyperglycemia: Secondary | ICD-10-CM

## 2015-04-05 DIAGNOSIS — IMO0002 Reserved for concepts with insufficient information to code with codable children: Secondary | ICD-10-CM

## 2015-04-05 LAB — HEMOGLOBIN A1C: Hgb A1c MFr Bld: 6.4 % (ref 4.6–6.5)

## 2015-04-05 LAB — BASIC METABOLIC PANEL
BUN: 16 mg/dL (ref 6–23)
CO2: 23 mEq/L (ref 19–32)
Calcium: 9.4 mg/dL (ref 8.4–10.5)
Chloride: 109 mEq/L (ref 96–112)
Creatinine, Ser: 0.99 mg/dL (ref 0.40–1.50)
GFR: 81.36 mL/min (ref >60.00)
GLUCOSE: 138 mg/dL — AB (ref 70–99)
Potassium: 4 mEq/L (ref 3.5–5.1)
SODIUM: 140 meq/L (ref 135–145)

## 2015-04-08 ENCOUNTER — Ambulatory Visit (INDEPENDENT_AMBULATORY_CARE_PROVIDER_SITE_OTHER): Payer: 59 | Admitting: Endocrinology

## 2015-04-08 ENCOUNTER — Encounter: Payer: Self-pay | Admitting: Endocrinology

## 2015-04-08 VITALS — BP 116/78 | HR 77 | Temp 97.9°F | Resp 16 | Ht 69.0 in | Wt 183.4 lb

## 2015-04-08 DIAGNOSIS — E119 Type 2 diabetes mellitus without complications: Secondary | ICD-10-CM

## 2015-04-08 DIAGNOSIS — R197 Diarrhea, unspecified: Secondary | ICD-10-CM

## 2015-04-08 NOTE — Patient Instructions (Signed)
Stay on 2 metformin for a week and if not better call Next week go back to 3 daily if better  Check blood sugars on waking up .. 3 .. times a week Also check blood sugars about 2 hours after a meal and do this after different meals by rotation  Recommended blood sugar levels on waking up is 90-130 and about 2 hours after meal is 140-180 Please bring blood sugar monitor to each visit.

## 2015-04-08 NOTE — Progress Notes (Signed)
Patient ID: Joseph Sharp, male   DOB: 01-02-1951, 64 y.o.   MRN: [id number]   Reason for Appointment: Diabetes follow-up and complete physical exam  History of Present Illness   Diagnosis: Type 2 DIABETES MELITUS, diagnosed in 1977     PAST history: He has had long-standing diabetes managed with multiple drugs and usually well controlled His blood sugar control is somewhat dependent on his compliance with diet and exercise regimen  In 11 /14 his blood sugars were higher overall and A1c was 7% which was higher than usual Because of this he was started on Trulicity injections, 2.44 mg  RECENT history: In 2/16 his A1c had been relatively higher than usual at 7% and he felt this was from noncompliance with diet and exercise However more recently has been doing better with his compliance with both diet and a walking program and his A1c is down to 6.4 He did benefit overall from starting 0.10 mg Trulicity as above  No side effects from Trulicity which he is taking on Saturday evenings.  Is also taking 3 oral medications with the Prandin only before breakfast Recent blood sugars are fairly consistently good except mildly higher in the mornings  ORAL hypoglycemic drugs: Prandin 1mg  in a.m., metformin 2g, pioglitazone 30 mg Side effects from medications: None  Monitors blood glucose: rarely    Glucometer:  FreeStyle         Blood Glucose readings recently from monitor download:   PRE-MEAL Breakfast  PCB   PCL  Bedtime Overall  Glucose range:  123-155   104-181   87-148   137-154    Mean/median:  130   134    144   129     Mean values apply above for all meters except median for One Touch  No hypoglycemia          Diet: Usually controlled with smaller portions. Has a protein drink , egg and toast instant; less fast food  EXERCISE: Has been walking 3/7. 20 min      Wt Readings from Last 3 Encounters:  04/08/15 183 lb 6.4 oz (83.19 kg)  01/08/15 186 lb (84.369 kg)    09/04/14 183 lb 3.2 oz (83.099 kg)   Lab Results  Component Value Date   HGBA1C 6.4 04/05/2015   HGBA1C 7.0* 01/05/2015   HGBA1C 6.6* 09/01/2014   Lab Results  Component Value Date   MICROALBUR 1.8 01/05/2015   LDLCALC 62 01/05/2015   CREATININE 0.99 04/05/2015    Lab on 04/05/2015  Component Date Value Ref Range Status  . Hgb A1c MFr Bld 04/05/2015 6.4  4.6 - 6.5 % Final   Glycemic Control Guidelines for People with Diabetes:Non Diabetic:  <6%Goal of Therapy: <7%Additional Action Suggested:  >8%   . Sodium 04/05/2015 140  135 - 145 mEq/L Final  . Potassium 04/05/2015 4.0  3.5 - 5.1 mEq/L Final  . Chloride 04/05/2015 109  96 - 112 mEq/L Final  . CO2 04/05/2015 23  19 - 32 mEq/L Final  . Glucose, Bld 04/05/2015 138* 70 - 99 mg/dL Final  . BUN 04/05/2015 16  6 - 23 mg/dL Final  . Creatinine, Ser 04/05/2015 0.99  0.40 - 1.50 mg/dL Final  . Calcium 04/05/2015 9.4  8.4 - 10.5 mg/dL Final  . GFR 04/05/2015 81.36  >60.00 mL/min Final      Medication List       This list is accurate as of: 04/08/15 10:03 AM.  Always use your  most recent med list.               aspirin 81 MG tablet  Take 81 mg by mouth daily.     Azelastine HCl 0.15 % Soln     cholecalciferol 1000 UNITS tablet  Commonly known as:  VITAMIN D  Take 1,000 Units by mouth daily.     EPIPEN 2-PAK 0.3 mg/0.3 mL Soaj injection  Generic drug:  EPINEPHrine     fluticasone 50 MCG/ACT nasal spray  Commonly known as:  FLONASE     glucose blood test strip  Commonly known as:  FREESTYLE LITE  Use as instructed to check blood sugar once a day dx code E11.9     lovastatin 20 MG tablet  Commonly known as:  MEVACOR  TAKE 1 TABLET (20 MG TOTAL) BY MOUTH EVERY MORNING.     metFORMIN 500 MG 24 hr tablet  Commonly known as:  GLUCOPHAGE-XR  TAKE 4 TABLETS ONCE DAILY     pioglitazone 30 MG tablet  Commonly known as:  ACTOS  TAKE 1 TABLET ONCE DAILY     repaglinide 1 MG tablet  Commonly known as:  PRANDIN  TAKE   1 BEFORE BREAKFAST     tadalafil 20 MG tablet  Commonly known as:  CIALIS  Take 1 tablet (20 mg total) by mouth daily as needed for erectile dysfunction.     TRULICITY 3.73 SK/8.7GO Sopn  Generic drug:  Dulaglutide  INJECT INTO THE SKIN ONCE A WEEK        Allergies: No Known Allergies  Past Medical History  Diagnosis Date  . Allergy   . Diabetes mellitus without complication     Past Surgical History  Procedure Laterality Date  . Eye surgery  01/2013    Eyelid surgery    Family History  Problem Relation Age of Onset  . Diabetes Mother   . Arthritis Mother   . Diabetes Brother   . Cancer Neg Hx   . Heart disease Neg Hx     Social History:  reports that he has never smoked. He has never used smokeless tobacco. He reports that he does not drink alcohol or use illicit drugs.  Review of Systems:  Diarrhea present for the last 2-3 weeks.  This happens only about twice a week or so and usually one watery stool with mild cramping.  No abdominal pain or change in stools otherwise He has done stool Hemoccult in 5/16 which was negative   HYPERLIPIDEMIA: The lipid abnormality consists of elevated LDL and triglycerides and low HDL, usually well controlled with lovastatin except for low HDL   Lab Results  Component Value Date   CHOL 124 01/05/2015   HDL 38.30* 01/05/2015   LDLCALC 62 01/05/2015   TRIG 120.0 01/05/2015   CHOLHDL 3 01/05/2015    He previously had transient hypogonadotropic hypogonadism which subsequently had resolved.  Was previously treated with AndroGel.  He does have decreased libido and mild gynecomastia on exam but testosterone level is normal now    Lab Results  Component Value Date   TESTOSTERONE 381.2 01/05/2015    Last foot exam in 10/15 was normal.      Examination:   BP 116/78 mmHg  Pulse 77  Temp(Src) 97.9 F (36.6 C)  Resp 16  Ht 5\' 9"  (1.753 m)  Wt 183 lb 6.4 oz (83.19 kg)  BMI 27.07 kg/m2  SpO2 96%  Body mass index is 27.07  kg/(m^2).   Mild left lower  abdominal quadrant tenderness without mass palpable  ASSESSMENT/ PLAN:    Diabetes type 2   The patient's diabetes control appears to be significantly better with his trying to watch his diet and check his blood sugars as well as start walking He tends to have relatively high fasting readings For now we will continue the same regimen since A1c is below 6.5  Diarrhea: This appears to be intermittent and possibly related to metformin.  As a trial he can reduce metformin to 1000 mg a day and call if not improved Consider colonoscopy  Patient Instructions  Stay on 2 metformin for a week and if not better call Next week go back to 3 daily if better  Check blood sugars on waking up .. 3 .. times a week Also check blood sugars about 2 hours after a meal and do this after different meals by rotation  Recommended blood sugar levels on waking up is 90-130 and about 2 hours after meal is 140-180 Please bring blood sugar monitor to each visit.      Aurelia Gras 04/08/2015, 10:03 AM

## 2015-06-19 ENCOUNTER — Other Ambulatory Visit: Payer: Self-pay | Admitting: Endocrinology

## 2015-07-03 ENCOUNTER — Other Ambulatory Visit: Payer: Self-pay | Admitting: Endocrinology

## 2015-08-04 ENCOUNTER — Other Ambulatory Visit: Payer: 59

## 2015-08-09 ENCOUNTER — Ambulatory Visit: Payer: 59 | Admitting: Endocrinology

## 2015-08-13 ENCOUNTER — Other Ambulatory Visit (INDEPENDENT_AMBULATORY_CARE_PROVIDER_SITE_OTHER): Payer: 59

## 2015-08-13 DIAGNOSIS — E119 Type 2 diabetes mellitus without complications: Secondary | ICD-10-CM | POA: Diagnosis not present

## 2015-08-13 LAB — COMPREHENSIVE METABOLIC PANEL
ALT: 18 U/L (ref 0–53)
AST: 15 U/L (ref 0–37)
Albumin: 4 g/dL (ref 3.5–5.2)
Alkaline Phosphatase: 64 U/L (ref 39–117)
BUN: 22 mg/dL (ref 6–23)
CHLORIDE: 108 meq/L (ref 96–112)
CO2: 26 mEq/L (ref 19–32)
Calcium: 9.4 mg/dL (ref 8.4–10.5)
Creatinine, Ser: 0.92 mg/dL (ref 0.40–1.50)
GFR: 88.45 mL/min (ref >60.00)
GLUCOSE: 120 mg/dL — AB (ref 70–99)
POTASSIUM: 4.2 meq/L (ref 3.5–5.1)
Sodium: 141 mEq/L (ref 135–145)
Total Bilirubin: 0.3 mg/dL (ref 0.2–1.2)
Total Protein: 6.6 g/dL (ref 6.0–8.3)

## 2015-08-13 LAB — HEMOGLOBIN A1C: HEMOGLOBIN A1C: 6.7 % — AB (ref 4.6–6.5)

## 2015-08-18 ENCOUNTER — Ambulatory Visit (INDEPENDENT_AMBULATORY_CARE_PROVIDER_SITE_OTHER): Payer: 59 | Admitting: Endocrinology

## 2015-08-18 VITALS — BP 118/76 | HR 87 | Temp 97.8°F | Resp 12 | Wt 185.8 lb

## 2015-08-18 DIAGNOSIS — E119 Type 2 diabetes mellitus without complications: Secondary | ICD-10-CM | POA: Diagnosis not present

## 2015-08-18 NOTE — Progress Notes (Signed)
Patient ID: Joseph Sharp, male   DOB: [birth year], 64 y.o.   MRN: [id number]   Reason for Appointment: Diabetes follow-up   History of Present Illness   Diagnosis: Type 2 DIABETES MELITUS, diagnosed in 1977     PAST history: He has had long-standing diabetes managed with multiple drugs and usually well controlled His blood sugar control is somewhat dependent on his compliance with diet and exercise regimen  In 11 /14 his blood sugars were higher overall and A1c was 7% which was higher than usual Because of this he was started on Trulicity injections, 5.03 mg He did benefit overall from starting 5.46 mg Trulicity  RECENT history: He is taking his Trulicity every Saturday Is also taking 3 oral medications with the Prandin only before breakfast  He has not been seen in follow-up since 5/16 Although his A1c is only slightly higher at 6.7 he appears to have relatively high readings fasting. Currently taking Prandin only before breakfast since blood sugars would previously be higher mostly after morning meals He has done only a couple of readings after lunch and supper but readings at bedtime appear to be relatively high He tries to take his metformin in the morning with his other medications as he prefers not to take medications in the evenings  ORAL hypoglycemic drugs: Prandin 1mg  in a.m., metformin 2g, pioglitazone 30 mg Side effects from medications: None  Monitors blood glucose: rarely    Glucometer:  FreeStyle         Blood Glucose readings recently from monitor download:   Mean values apply above for all meters except median for One Touch  PRE-MEAL Fasting Lunch Dinner Bedtime Overall  Glucose range:  130-172   95-157   108-162   149, 175    Mean/median:  148      139    No hypoglycemia          Diet: Usually controlled with smaller portions. Has a protein drink , egg and toast instant; less fast food  EXERCISE: Has been walking 1/7 for 20 min at lunch      Wt Readings from Last 3 Encounters:  08/18/15 185 lb 12.8 oz (84.278 kg)  04/08/15 183 lb 6.4 oz (83.19 kg)  01/08/15 186 lb (84.369 kg)   Lab Results  Component Value Date   HGBA1C 6.7* 08/13/2015   HGBA1C 6.4 04/05/2015   HGBA1C 7.0* 01/05/2015   Lab Results  Component Value Date   MICROALBUR 1.8 01/05/2015   LDLCALC 62 01/05/2015   CREATININE 0.92 08/13/2015    Appointment on 08/13/2015  Component Date Value Ref Range Status  . Hgb A1c MFr Bld 08/13/2015 6.7* 4.6 - 6.5 % Final   Glycemic Control Guidelines for People with Diabetes:Non Diabetic:  <6%Goal of Therapy: <7%Additional Action Suggested:  >8%   . Sodium 08/13/2015 141  135 - 145 mEq/L Final  . Potassium 08/13/2015 4.2  3.5 - 5.1 mEq/L Final  . Chloride 08/13/2015 108  96 - 112 mEq/L Final  . CO2 08/13/2015 26  19 - 32 mEq/L Final  . Glucose, Bld 08/13/2015 120* 70 - 99 mg/dL Final  . BUN 08/13/2015 22  6 - 23 mg/dL Final  . Creatinine, Ser 08/13/2015 0.92  0.40 - 1.50 mg/dL Final  . Total Bilirubin 08/13/2015 0.3  0.2 - 1.2 mg/dL Final  . Alkaline Phosphatase 08/13/2015 64  39 - 117 U/L Final  . AST 08/13/2015 15  0 - 37 U/L Final  . ALT  08/13/2015 18  0 - 53 U/L Final  . Total Protein 08/13/2015 6.6  6.0 - 8.3 g/dL Final  . Albumin 08/13/2015 4.0  3.5 - 5.2 g/dL Final  . Calcium 08/13/2015 9.4  8.4 - 10.5 mg/dL Final  . GFR 08/13/2015 88.45  >60.00 mL/min Final      Medication List       This list is accurate as of: 08/18/15  9:24 PM.  Always use your most recent med list.               aspirin 81 MG tablet  Take 81 mg by mouth daily.     Azelastine HCl 0.15 % Soln     cholecalciferol 1000 UNITS tablet  Commonly known as:  VITAMIN D  Take 1,000 Units by mouth daily.     EPIPEN 2-PAK 0.3 mg/0.3 mL Soaj injection  Generic drug:  EPINEPHrine     fluticasone 50 MCG/ACT nasal spray  Commonly known as:  FLONASE     glucose blood test strip  Commonly known as:  FREESTYLE LITE  Use as  instructed to check blood sugar once a day dx code E11.9     lovastatin 20 MG tablet  Commonly known as:  MEVACOR  TAKE 1 TABLET (20 MG TOTAL) BY MOUTH EVERY MORNING.     metFORMIN 500 MG 24 hr tablet  Commonly known as:  GLUCOPHAGE-XR  TAKE 4 TABLETS ONCE DAILY     pioglitazone 30 MG tablet  Commonly known as:  ACTOS  TAKE 1 TABLET ONCE DAILY     repaglinide 1 MG tablet  Commonly known as:  PRANDIN  TAKE 1 TABLET BEFORE DINNER AND 2 BEFORE BREAKFAST     tadalafil 20 MG tablet  Commonly known as:  CIALIS  Take 1 tablet (20 mg total) by mouth daily as needed for erectile dysfunction.     TRULICITY 5.59 RC/1.6LA Sopn  Generic drug:  Dulaglutide  INJECT INTO THE SKIN ONCE A WEEK        Allergies: No Known Allergies  Past Medical History  Diagnosis Date  . Allergy   . Diabetes mellitus without complication     Past Surgical History  Procedure Laterality Date  . Eye surgery  01/2013    Eyelid surgery    Family History  Problem Relation Age of Onset  . Diabetes Mother   . Arthritis Mother   . Diabetes Brother   . Cancer Neg Hx   . Heart disease Neg Hx     Social History:  reports that he has never smoked. He has never used smokeless tobacco. He reports that he does not drink alcohol or use illicit drugs.  Review of Systems:  Diarrhea resolved and he is able to tolerate 2000 mg of metformin  HYPERLIPIDEMIA: The lipid abnormality consists of elevated LDL and triglycerides and low HDL, usually well controlled with lovastatin except for low HDL   Lab Results  Component Value Date   CHOL 124 01/05/2015   HDL 38.30* 01/05/2015   LDLCALC 62 01/05/2015   TRIG 120.0 01/05/2015   CHOLHDL 3 01/05/2015    He previously had transient hypogonadotropic hypogonadism which subsequently had resolved.  Was previously treated with AndroGel.  He does have decreased libido and mild gynecomastia on exam but testosterone level is normal now    Lab Results  Component Value  Date   TESTOSTERONE 381.2 01/05/2015    Last foot exam in 10/16 was normal.      Examination:  BP 118/76 mmHg  Pulse 87  Temp(Src) 97.8 F (36.6 C) (Oral)  Resp 12  Wt 185 lb 12.8 oz (84.278 kg)  SpO2 97%  Body mass index is 27.43 kg/(m^2).    Diabetic foot exam shows normal monofilament sensation in the toes and plantar surfaces, no skin lesions or ulcers on the feet and normal pedal pulses  ASSESSMENT/ PLAN:    Diabetes type 2   The patient's diabetes control appears to be relatively worse especially with fasting hyperglycemia He has not been exercising much because of the hot weather  Since most recent high readings are a late at night or fasting he willstart taking Prandin 1 mg before supper time also Encouraged him to check more readings after lunch and dinner To start walking at least 5 days a week  Influenza vaccine given  Patient Instructions  Take Prandin before supper also  Walk daily     Tayten Bergdoll 08/18/2015, 9:24 PM

## 2015-08-18 NOTE — Patient Instructions (Addendum)
Take Prandin before supper also  Walk daily

## 2015-08-19 ENCOUNTER — Other Ambulatory Visit: Payer: Self-pay | Admitting: *Deleted

## 2015-08-19 DIAGNOSIS — Z23 Encounter for immunization: Secondary | ICD-10-CM

## 2015-09-27 ENCOUNTER — Other Ambulatory Visit: Payer: Self-pay | Admitting: Endocrinology

## 2015-10-15 ENCOUNTER — Ambulatory Visit (INDEPENDENT_AMBULATORY_CARE_PROVIDER_SITE_OTHER): Payer: 59 | Admitting: Family Medicine

## 2015-10-15 VITALS — BP 126/80 | HR 76 | Temp 98.5°F | Resp 18 | Ht 70.0 in | Wt 189.2 lb

## 2015-10-15 DIAGNOSIS — R6884 Jaw pain: Secondary | ICD-10-CM

## 2015-10-15 MED ORDER — DICLOFENAC SODIUM 75 MG PO TBEC: 75.0000 mg | DELAYED_RELEASE_TABLET | Freq: Two times a day (BID) | ORAL | Status: DC

## 2015-10-15 MED ORDER — CLONAZEPAM 0.5 MG PO TABS
0.5000 mg | ORAL_TABLET | Freq: Two times a day (BID) | ORAL | Status: DC | PRN
Start: 2015-10-15 — End: 2016-04-26

## 2015-10-15 NOTE — Progress Notes (Signed)
Patient ID: Joseph Sharp, male    DOB: May 30, 1951  Age: 64 y.o. MRN: [id number]  Chief Complaint  Patient presents with  . Mouth Injury    can't open his mouth, started monday     Subjective:   Patient has a history of waking up on Monday with an inability to open his mouth very wide. It has some pain in the right side of the jaw, actually anterior to the joint. No oral lesions. No dental problems. He has a hard time opening wide enough to eat well, but it doesn't really hurt anywhere to chew. There is no injury. It just happened one morning.  Current allergies, medications, problem list, past/family and social histories reviewed.  Objective:  BP 126/80 mmHg  Pulse 76  Temp(Src) 98.5 F (36.9 C) (Oral)  Resp 18  Ht 5\' 10"  (1.778 m)  Wt 189 lb 3.2 oz (85.821 kg)  BMI 27.15 kg/m2  SpO2 98%  No major acute distress. Can only open the jaw wide enough for the teeth to be separated about 2.3 cm. No tenderness of gums. No dental abnormalities though he is had a lot of dental work done. Mild tenderness in the mid right jaw. TMJs do not pop or click. No nodes palpable.  Assessment & Plan:   Assessment: 1. Jaw pain       Plan: We'll try relaxants with a benzodiazepine and anti-inflammatory medications. If not improving I suggest he see a dentist who is interested in jaw issues. Dr. Hali Marry is one such.    Meds ordered this encounter  Medications  . clonazePAM (KLONOPIN) 0.5 MG tablet    Sig: Take 1 tablet (0.5 mg total) by mouth 2 (two) times daily as needed for anxiety.    Dispense:  20 tablet    Refill:  1  . diclofenac (VOLTAREN) 75 MG EC tablet    Sig: Take 1 tablet (75 mg total) by mouth 2 (two) times daily.    Dispense:  20 tablet    Refill:  0         Patient Instructions  Take clonazepam 0.5 mg twice daily to help relax the jaw  Take diclofenac anti-inflammatory medication 75 mg one twice daily with breakfast and supper. Make sure you drink some  extra water with this.  If not improving by next week I recommend that you try to see Dr. Hali Marry, dentist, whose office is on [address].  Tell him Dr. Linna Darner suggested you see him for the jaw.  Return at anytime if worse and more inability to open the mouth.   Return prn  Azile Minardi, MD 10/15/2015

## 2015-10-15 NOTE — Patient Instructions (Signed)
Take clonazepam 0.5 mg twice daily to help relax the jaw  Take diclofenac anti-inflammatory medication 75 mg one twice daily with breakfast and supper. Make sure you drink some extra water with this.  If not improving by next week I recommend that you try to see Dr. Hali Marry, dentist, whose office is on [place].  Tell him Dr. Linna Darner suggested you see him for the jaw.  Return at anytime if worse and more inability to open the mouth.

## 2015-10-20 ENCOUNTER — Other Ambulatory Visit: Payer: Self-pay | Admitting: Endocrinology

## 2015-12-08 ENCOUNTER — Other Ambulatory Visit: Payer: Self-pay | Admitting: Endocrinology

## 2015-12-16 ENCOUNTER — Other Ambulatory Visit (INDEPENDENT_AMBULATORY_CARE_PROVIDER_SITE_OTHER): Payer: 59

## 2015-12-16 DIAGNOSIS — E119 Type 2 diabetes mellitus without complications: Secondary | ICD-10-CM

## 2015-12-16 LAB — BASIC METABOLIC PANEL
BUN: 25 mg/dL — AB (ref 6–23)
CALCIUM: 9.5 mg/dL (ref 8.4–10.5)
CO2: 25 meq/L (ref 19–32)
CREATININE: 1.14 mg/dL (ref 0.40–1.50)
Chloride: 108 mEq/L (ref 96–112)
GFR: 68.98 mL/min (ref >60.00)
GLUCOSE: 149 mg/dL — AB (ref 70–99)
Potassium: 4.4 mEq/L (ref 3.5–5.1)
SODIUM: 141 meq/L (ref 135–145)

## 2015-12-16 LAB — URINALYSIS, ROUTINE W REFLEX MICROSCOPIC
BILIRUBIN URINE: NEGATIVE
HGB URINE DIPSTICK: NEGATIVE
Ketones, ur: NEGATIVE
Leukocytes, UA: NEGATIVE
NITRITE: NEGATIVE
PH: 5.5 (ref 5.0–8.0)
RBC / HPF: NONE SEEN (ref 0–?)
Specific Gravity, Urine: >=1.03 — AB (ref 1.000–1.030)
Total Protein, Urine: NEGATIVE
Urine Glucose: NEGATIVE
Urobilinogen, UA: 0.2 (ref 0.0–1.0)

## 2015-12-16 LAB — LIPID PANEL
Cholesterol: 101 mg/dL (ref 0–200)
HDL: 34 mg/dL — AB (ref >39.00)
LDL CALC: 49 mg/dL (ref 0–99)
NONHDL: 66.8
Total CHOL/HDL Ratio: 3
Triglycerides: 89 mg/dL (ref 0.0–149.0)
VLDL: 17.8 mg/dL (ref 0.0–40.0)

## 2015-12-16 LAB — HEMOGLOBIN A1C: HEMOGLOBIN A1C: 6.8 % — AB (ref 4.6–6.5)

## 2015-12-16 LAB — MICROALBUMIN / CREATININE URINE RATIO
Creatinine,U: 208.6 mg/dL
MICROALB/CREAT RATIO: 0.5 mg/g (ref 0.0–30.0)
Microalb, Ur: 1 mg/dL (ref 0.0–1.9)

## 2015-12-18 ENCOUNTER — Other Ambulatory Visit: Payer: Self-pay | Admitting: Endocrinology

## 2015-12-21 ENCOUNTER — Ambulatory Visit (INDEPENDENT_AMBULATORY_CARE_PROVIDER_SITE_OTHER): Payer: 59 | Admitting: Endocrinology

## 2015-12-21 ENCOUNTER — Encounter: Payer: Self-pay | Admitting: Endocrinology

## 2015-12-21 VITALS — BP 136/82 | HR 92 | Temp 98.7°F | Resp 14 | Ht 70.0 in | Wt 187.8 lb

## 2015-12-21 DIAGNOSIS — E785 Hyperlipidemia, unspecified: Secondary | ICD-10-CM | POA: Diagnosis not present

## 2015-12-21 DIAGNOSIS — E119 Type 2 diabetes mellitus without complications: Secondary | ICD-10-CM

## 2015-12-21 DIAGNOSIS — R5382 Chronic fatigue, unspecified: Secondary | ICD-10-CM | POA: Diagnosis not present

## 2015-12-21 DIAGNOSIS — N62 Hypertrophy of breast: Secondary | ICD-10-CM | POA: Diagnosis not present

## 2015-12-21 NOTE — Progress Notes (Signed)
Patient ID: Joseph Sharp, Joseph Sharp   DOB: 1951/11/05, 65 y.o.   MRN: [id number]   Reason for Appointment: Diabetes follow-up   History of Present Illness   Diagnosis: Type 2 DIABETES MELITUS, diagnosed in 1977     PAST history: He has had long-standing diabetes managed with multiple drugs and usually well controlled His blood sugar control is somewhat dependent on his compliance with diet and exercise regimen  In 11 /14 his blood sugars were higher overall and A1c was 7% which was higher than usual Because of this he was started on Trulicity injections, A999333 mg He did benefit overall from starting A999333 mg Trulicity  RECENT history:  ORAL hypoglycemic drugs: Prandin 1mg  in a.m., metformin 2g, pioglitazone 30 mg  He is taking his Trulicity every Saturday  He was advised to take brand and at suppertime also on the last visit because of periodic high readings after supper and overnight However he tends to forget to take his medications in the evenings Also taking metformin 2000 mg at breakfast one today  He has checked his sugars more consistently and at various times throughout the day Blood sugars are inconsistent with sporadic high readings fasting, occasionally after lunch and around 7 PM Although his weight is not improved he is now starting to go to the gym over the last 2 weeks  Side effects from medications: None  Monitors blood glucose: rarely    Glucometer:  FreeStyle         Blood Glucose readings recently from monitor download:   Mean values apply above for all meters except median for One Touch  PRE-MEAL Fasting Lunch  afternoon   evening  Overall  Glucose range:  115-171   97-154   116-177   102-175   97-177   Mean/median:  133   120   137   125  130 +/-19     No hypoglycemia          Diet: Usually controlled with smaller portions. Has a protein drink , egg and toast at breakfast usually getting low fat meals EXERCISE: Has been to the gym, 30 min  5/7 for the last 2 weeks    Wt Readings from Last 3 Encounters:  12/21/15 187 lb 12.8 oz (85.186 kg)  10/15/15 189 lb 3.2 oz (85.821 kg)  08/18/15 185 lb 12.8 oz (84.278 kg)   Lab Results  Component Value Date   HGBA1C 6.8* 12/16/2015   HGBA1C 6.7* 08/13/2015   HGBA1C 6.4 04/05/2015   Lab Results  Component Value Date   MICROALBUR 1.0 12/16/2015   LDLCALC 49 12/16/2015   CREATININE 1.14 12/16/2015    Lab on 12/16/2015  Component Date Value Ref Range Status  . Hgb A1c MFr Bld 12/16/2015 6.8* 4.6 - 6.5 % Final   Glycemic Control Guidelines for People with Diabetes:Non Diabetic:  <6%Goal of Therapy: <7%Additional Action Suggested:  >8%   . Sodium 12/16/2015 141  135 - 145 mEq/L Final  . Potassium 12/16/2015 4.4  3.5 - 5.1 mEq/L Final  . Chloride 12/16/2015 108  96 - 112 mEq/L Final  . CO2 12/16/2015 25  19 - 32 mEq/L Final  . Glucose, Bld 12/16/2015 149* 70 - 99 mg/dL Final  . BUN 12/16/2015 25* 6 - 23 mg/dL Final  . Creatinine, Ser 12/16/2015 1.14  0.40 - 1.50 mg/dL Final  . Calcium 12/16/2015 9.5  8.4 - 10.5 mg/dL Final  . GFR 12/16/2015 68.98  >60.00 mL/min Final  .  Microalb, Ur 12/16/2015 1.0  0.0 - 1.9 mg/dL Final  . Creatinine,U 12/16/2015 208.6   Final  . Microalb Creat Ratio 12/16/2015 0.5  0.0 - 30.0 mg/g Final  . Cholesterol 12/16/2015 101  0 - 200 mg/dL Final   ATP III Classification       Desirable:  < 200 mg/dL               Borderline High:  200 - 239 mg/dL          High:  > = 240 mg/dL  . Triglycerides 12/16/2015 89.0  0.0 - 149.0 mg/dL Final   Normal:  <150 mg/dLBorderline High:  150 - 199 mg/dL  . HDL 12/16/2015 34.00* >39.00 mg/dL Final  . VLDL 12/16/2015 17.8  0.0 - 40.0 mg/dL Final  . LDL Cholesterol 12/16/2015 49  0 - 99 mg/dL Final  . Total CHOL/HDL Ratio 12/16/2015 3   Final                  Men          Women1/2 Average Risk     3.4          3.3Average Risk          5.0          4.42X Average Risk          9.6          7.13X Average Risk          15.0           11.0                      . NonHDL 12/16/2015 66.80   Final   NOTE:  Non-HDL goal should be 30 mg/dL higher than patient's LDL goal (i.e. LDL goal of < 70 mg/dL, would have non-HDL goal of < 100 mg/dL)  . Color, Urine 12/16/2015 YELLOW  Yellow;Lt. Yellow Final  . APPearance 12/16/2015 CLEAR  Clear Final  . Specific Gravity, Urine 12/16/2015 >=1.030* 1.000 - 1.030 Final  . pH 12/16/2015 5.5  5.0 - 8.0 Final  . Total Protein, Urine 12/16/2015 NEGATIVE  Negative Final  . Urine Glucose 12/16/2015 NEGATIVE  Negative Final  . Ketones, ur 12/16/2015 NEGATIVE  Negative Final  . Bilirubin Urine 12/16/2015 NEGATIVE  Negative Final  . Hgb urine dipstick 12/16/2015 NEGATIVE  Negative Final  . Urobilinogen, UA 12/16/2015 0.2  0.0 - 1.0 Final  . Leukocytes, UA 12/16/2015 NEGATIVE  Negative Final  . Nitrite 12/16/2015 NEGATIVE  Negative Final  . WBC, UA 12/16/2015 0-2/hpf  0-2/hpf Final  . RBC / HPF 12/16/2015 none seen  0-2/hpf Final  . Mucus, UA 12/16/2015 Presence of* None Final  . Ca Oxalate Crys, UA 12/16/2015 Presence of* None Final      Medication List       This list is accurate as of: 12/21/15  8:49 PM.  Always use your most recent med list.               aspirin 81 MG tablet  Take 81 mg by mouth daily.     Azelastine HCl 0.15 % Soln     cholecalciferol 1000 units tablet  Commonly known as:  VITAMIN D  Take 1,000 Units by mouth daily.     clonazePAM 0.5 MG tablet  Commonly known as:  KLONOPIN  Take 1 tablet (0.5 mg total) by mouth 2 (two) times daily as needed for anxiety.  diclofenac 75 MG EC tablet  Commonly known as:  VOLTAREN  Take 1 tablet (75 mg total) by mouth 2 (two) times daily.     EPIPEN 2-PAK 0.3 mg/0.3 mL Soaj injection  Generic drug:  EPINEPHrine     fluticasone 50 MCG/ACT nasal spray  Commonly known as:  FLONASE     FREESTYLE LITE test strip  Generic drug:  glucose blood  USE AS INSTRUCTED TO CHECK BLOOD SUGAR ONCE A DAY     lovastatin 20  MG tablet  Commonly known as:  MEVACOR  TAKE 1 TABLET (20 MG TOTAL) BY MOUTH EVERY MORNING.     metFORMIN 500 MG 24 hr tablet  Commonly known as:  GLUCOPHAGE-XR  TAKE 4 TABLETS ONCE DAILY     pioglitazone 30 MG tablet  Commonly known as:  ACTOS  TAKE 1 TABLET DAILY     repaglinide 1 MG tablet  Commonly known as:  PRANDIN  TAKE 1 TABLET BEFORE DINNER AND 2 BEFORE BREAKFAST     tadalafil 20 MG tablet  Commonly known as:  CIALIS  Take 1 tablet (20 mg total) by mouth daily as needed for erectile dysfunction.     TRULICITY A999333 0000000 Sopn  Generic drug:  Dulaglutide  INJECT INTO THE SKIN ONCE A WEEK        Allergies: No Known Allergies  Past Medical History  Diagnosis Date  . Allergy   . Diabetes mellitus without complication [facility])     Past Surgical History  Procedure Laterality Date  . Eye surgery  01/2013    Eyelid surgery    Family History  Problem Relation Age of Onset  . Diabetes Mother   . Arthritis Mother   . Diabetes Brother   . Cancer Neg Hx   . Heart disease Neg Hx     Social History:  reports that he has never smoked. He has never used smokeless tobacco. He reports that he does not drink alcohol or use illicit drugs.  Review of Systems:    HYPERLIPIDEMIA: The lipid abnormality consists of elevated LDL and triglycerides and low HDL, usually well controlled with lovastatin except for low HDL   Lab Results  Component Value Date   CHOL 101 12/16/2015   HDL 34.00* 12/16/2015   LDLCALC 49 12/16/2015   TRIG 89.0 12/16/2015   CHOLHDL 3 12/16/2015    He previously had transient hypogonadotropic hypogonadism which subsequently had resolved.  Was previously treated with AndroGel.  He does have decreased libido and mild gynecomastia on exam but testosterone level is normal now    Lab Results  Component Value Date   TESTOSTERONE 381.2 01/05/2015    Last foot exam in 10/16 was normal.      Examination:   BP 136/82 mmHg  Pulse 92  Temp(Src)  98.7 F (37.1 C)  Resp 14  Ht 5\' 10"  (1.778 m)  Wt 187 lb 12.8 oz (85.186 kg)  BMI 26.95 kg/m2  SpO2 92%  Body mass index is 26.95 kg/(m^2).      ASSESSMENT/ PLAN:    Diabetes type 2   See history of present illness for detailed discussion of his current management, blood sugar patterns and problems identified  Although his A1c is still below 7% it is relatively higher than what he has had before On multiple drugs currently including Trulicity He thinks he has adequate control of portions with A999333 mg Trulicity Recently he does not have consistently high readings  Also not that he is starting to  exercise at a gym he probably will get better control He will continue the same medications He can try to take Prandin  in the evening when he is planning a big meal Otherwise he can continue his medications in the mornings when he is better compliance  HYPERLIPIDEMIA: Excellent control  There are no Patient Instructions on file for this visit.   Joseph Sharp 12/21/2015, 8:49 PM

## 2016-01-04 ENCOUNTER — Other Ambulatory Visit: Payer: Self-pay | Admitting: *Deleted

## 2016-01-04 MED ORDER — DULAGLUTIDE 0.75 MG/0.5ML ~~LOC~~ SOAJ: SUBCUTANEOUS | Status: DC

## 2016-01-05 ENCOUNTER — Other Ambulatory Visit: Payer: Self-pay | Admitting: Endocrinology

## 2016-02-22 ENCOUNTER — Encounter: Payer: Self-pay | Admitting: *Deleted

## 2016-02-22 LAB — HM DIABETES EYE EXAM

## 2016-04-13 ENCOUNTER — Other Ambulatory Visit: Payer: 59

## 2016-04-17 ENCOUNTER — Other Ambulatory Visit: Payer: Self-pay | Admitting: *Deleted

## 2016-04-17 MED ORDER — REPAGLINIDE 1 MG PO TABS: ORAL_TABLET | ORAL | Status: DC

## 2016-04-18 ENCOUNTER — Other Ambulatory Visit (INDEPENDENT_AMBULATORY_CARE_PROVIDER_SITE_OTHER): Payer: 59

## 2016-04-18 DIAGNOSIS — N62 Hypertrophy of breast: Secondary | ICD-10-CM | POA: Diagnosis not present

## 2016-04-18 DIAGNOSIS — R5382 Chronic fatigue, unspecified: Secondary | ICD-10-CM | POA: Diagnosis not present

## 2016-04-18 DIAGNOSIS — E119 Type 2 diabetes mellitus without complications: Secondary | ICD-10-CM

## 2016-04-18 LAB — COMPREHENSIVE METABOLIC PANEL
ALBUMIN: 4.3 g/dL (ref 3.5–5.2)
ALK PHOS: 66 U/L (ref 39–117)
ALT: 14 U/L (ref 0–53)
AST: 13 U/L (ref 0–37)
BILIRUBIN TOTAL: 0.4 mg/dL (ref 0.2–1.2)
BUN: 24 mg/dL — ABNORMAL HIGH (ref 6–23)
CALCIUM: 10 mg/dL (ref 8.4–10.5)
CO2: 25 mEq/L (ref 19–32)
Chloride: 105 mEq/L (ref 96–112)
Creatinine, Ser: 1.01 mg/dL (ref 0.40–1.50)
GFR: 79.24 mL/min (ref >60.00)
Glucose, Bld: 142 mg/dL — ABNORMAL HIGH (ref 70–99)
POTASSIUM: 4.4 meq/L (ref 3.5–5.1)
Sodium: 139 mEq/L (ref 135–145)
TOTAL PROTEIN: 6.8 g/dL (ref 6.0–8.3)

## 2016-04-18 LAB — CBC
HEMATOCRIT: 42.8 % (ref 39.0–52.0)
HEMOGLOBIN: 14.3 g/dL (ref 13.0–17.0)
MCHC: 33.4 g/dL (ref 30.0–36.0)
MCV: 81.3 fl (ref 78.0–100.0)
Platelets: 270 10*3/uL (ref 150.0–400.0)
RBC: 5.26 Mil/uL (ref 4.22–5.81)
RDW: 14.5 % (ref 11.5–15.5)
WBC: 7.5 10*3/uL (ref 4.0–10.5)

## 2016-04-18 LAB — LIPID PANEL
CHOLESTEROL: 121 mg/dL (ref 0–200)
HDL: 34.6 mg/dL — AB (ref >39.00)
LDL Cholesterol: 64 mg/dL (ref 0–99)
NonHDL: 86.57
TRIGLYCERIDES: 115 mg/dL (ref 0.0–149.0)
Total CHOL/HDL Ratio: 4
VLDL: 23 mg/dL (ref 0.0–40.0)

## 2016-04-18 LAB — HEMOGLOBIN A1C: HEMOGLOBIN A1C: 6.6 % — AB (ref 4.6–6.5)

## 2016-04-18 LAB — MICROALBUMIN / CREATININE URINE RATIO
Creatinine,U: 141.3 mg/dL
MICROALB/CREAT RATIO: 0.5 mg/g (ref 0.0–30.0)
Microalb, Ur: <0.7 mg/dL (ref 0.0–1.9)

## 2016-04-18 LAB — TESTOSTERONE: Testosterone: 298.51 ng/dL — ABNORMAL LOW (ref 300.00–890.00)

## 2016-04-19 ENCOUNTER — Encounter: Payer: 59 | Admitting: Endocrinology

## 2016-04-26 ENCOUNTER — Encounter: Payer: Self-pay | Admitting: Endocrinology

## 2016-04-26 ENCOUNTER — Ambulatory Visit (INDEPENDENT_AMBULATORY_CARE_PROVIDER_SITE_OTHER): Payer: 59 | Admitting: Endocrinology

## 2016-04-26 VITALS — BP 132/88 | HR 72 | Ht 70.0 in | Wt 182.0 lb

## 2016-04-26 DIAGNOSIS — E119 Type 2 diabetes mellitus without complications: Secondary | ICD-10-CM

## 2016-04-26 DIAGNOSIS — E7800 Pure hypercholesterolemia, unspecified: Secondary | ICD-10-CM

## 2016-04-26 DIAGNOSIS — Z0000 Encounter for general adult medical examination without abnormal findings: Secondary | ICD-10-CM

## 2016-04-26 DIAGNOSIS — Z23 Encounter for immunization: Secondary | ICD-10-CM | POA: Diagnosis not present

## 2016-04-26 DIAGNOSIS — IMO0001 Reserved for inherently not codable concepts without codable children: Secondary | ICD-10-CM

## 2016-04-26 DIAGNOSIS — E291 Testicular hypofunction: Secondary | ICD-10-CM | POA: Diagnosis not present

## 2016-04-26 DIAGNOSIS — R030 Elevated blood-pressure reading, without diagnosis of hypertension: Secondary | ICD-10-CM

## 2016-04-26 DIAGNOSIS — R7989 Other specified abnormal findings of blood chemistry: Secondary | ICD-10-CM

## 2016-04-26 MED ORDER — RAMIPRIL 2.5 MG PO CAPS: 2.5000 mg | ORAL_CAPSULE | Freq: Every day | ORAL | Status: DC

## 2016-04-26 NOTE — Addendum Note (Signed)
Addended by: Verlin Grills T on: 04/26/2016 05:04 PM   Modules accepted: Orders

## 2016-04-26 NOTE — Progress Notes (Signed)
Patient ID: Joseph Sharp, male   DOB: 05-20-1951, 65 y.o.   MRN: [id number]   Reason for Appointment: Complete physical exam and Diabetes follow-up   History of Present Illness   Diagnosis: Type 2 DIABETES MELITUS, diagnosed in 1977     PAST history: He has had long-standing diabetes managed with multiple drugs and usually well controlled His blood sugar control is somewhat dependent on his compliance with diet and exercise regimen  In 11 /14 his blood sugars were higher overall and A1c was 7% which was higher than usual Because of this he was started on Trulicity injections, A999333 mg He did benefit overall from starting A999333 mg Trulicity  RECENT history:  Non-insulin hypoglycemic drugs: Prandin 1 mg in a.m., metformin 2g, pioglitazone 30 mg, Trulicity A999333 mg weekly  Current management, blood sugar patterns and problems identified:  He is checking his blood sugars infrequently now because of his running out of test strips  He is taking his Trulicity every Saturday and usually can control portions with this  He says that more recently has been trying to be better on his diet and eating more vegetables and less carbohydrate  Has been able to lose weight  Although he has exercised 3 days week he was doing more previously  Has only sporadic readings at home which again seemed to indicate slightly high fasting readings  He was told to take Prandin at suppertime for larger meals but has not done so.  However he tends to forget to take his medications in the evenings  No readings after supper  Side effects from medications: None  Monitors blood glucose: rarely    Glucometer:  FreeStyle         Blood Glucose readings recently from monitor download:   He has checked blood sugars only on 2 separate days recently Fasting readings 128, 135, nonfasting 122, 140  No hypoglycemia          Diet: Usually controlled with smaller portions. Has a egg and toast at  breakfast usually getting low fat meals More Vegs  EXERCISE: Has been to the gym, 30 min 3/7 for the last 2 weeks    Wt Readings from Last 3 Encounters:  04/26/16 182 lb (82.555 kg)  12/21/15 187 lb 12.8 oz (85.186 kg)  10/15/15 189 lb 3.2 oz (85.821 kg)   Lab Results  Component Value Date   HGBA1C 6.6* 04/18/2016   HGBA1C 6.8* 12/16/2015   HGBA1C 6.7* 08/13/2015   Lab Results  Component Value Date   MICROALBUR <0.7 04/18/2016   [place] 66 04/18/2016   CREATININE 1.01 04/18/2016         Lab Results  Component Value Date   OCCULTBLD Negative 03/24/2015       No visits with results within 1 Week(s) from this visit. Latest known visit with results is:  Lab on 04/18/2016  Component Date Value Ref Range Status  . Hgb A1c MFr Bld 04/18/2016 6.6* 4.6 - 6.5 % Final   Glycemic Control Guidelines for People with Diabetes:Non Diabetic:  <6%Goal of Therapy: <7%Additional Action Suggested:  >8%   . Sodium 04/18/2016 139  135 - 145 mEq/L Final  . Potassium 04/18/2016 4.4  3.5 - 5.1 mEq/L Final  . Chloride 04/18/2016 105  96 - 112 mEq/L Final  . CO2 04/18/2016 25  19 - 32 mEq/L Final  . Glucose, Bld 04/18/2016 142* 70 - 99 mg/dL Final  . BUN 04/18/2016 24* 6 - 23 mg/dL Final  .  Creatinine, Ser 04/18/2016 1.01  0.40 - 1.50 mg/dL Final  . Total Bilirubin 04/18/2016 0.4  0.2 - 1.2 mg/dL Final  . Alkaline Phosphatase 04/18/2016 66  39 - 117 U/L Final  . AST 04/18/2016 13  0 - 37 U/L Final  . ALT 04/18/2016 14  0 - 53 U/L Final  . Total Protein 04/18/2016 6.8  6.0 - 8.3 g/dL Final  . Albumin 04/18/2016 4.3  3.5 - 5.2 g/dL Final  . Calcium 04/18/2016 10.0  8.4 - 10.5 mg/dL Final  . GFR 04/18/2016 79.24  >60.00 mL/min Final  . Cholesterol 04/18/2016 121  0 - 200 mg/dL Final   ATP III Classification       Desirable:  < 200 mg/dL               Borderline High:  200 - 239 mg/dL          High:  > = 240 mg/dL  . Triglycerides 04/18/2016 115.0  0.0 - 149.0 mg/dL Final   Normal:   <150 mg/dLBorderline High:  150 - 199 mg/dL  . HDL 04/18/2016 34.60* >39.00 mg/dL Final  . VLDL 04/18/2016 23.0  0.0 - 40.0 mg/dL Final  . LDL Cholesterol 04/18/2016 64  0 - 99 mg/dL Final  . Total CHOL/HDL Ratio 04/18/2016 4   Final                  Men          Women1/2 Average Risk     3.4          3.3Average Risk          5.0          4.42X Average Risk          9.6          7.13X Average Risk          15.0          11.0                      . NonHDL 04/18/2016 86.57   Final   NOTE:  Non-HDL goal should be 30 mg/dL higher than patient's LDL goal (i.e. LDL goal of < 70 mg/dL, would have non-HDL goal of < 100 mg/dL)  . Microalb, Ur 04/18/2016 <0.7  0.0 - 1.9 mg/dL Final  . Creatinine,U 04/18/2016 141.3   Final  . Microalb Creat Ratio 04/18/2016 0.5  0.0 - 30.0 mg/g Final  . WBC 04/18/2016 7.5  4.0 - 10.5 K/uL Final  . RBC 04/18/2016 5.26  4.22 - 5.81 Mil/uL Final  . Platelets 04/18/2016 270.0  150.0 - 400.0 K/uL Final  . Hemoglobin 04/18/2016 14.3  13.0 - 17.0 g/dL Final  . HCT 04/18/2016 42.8  39.0 - 52.0 % Final  . MCV 04/18/2016 81.3  78.0 - 100.0 fl Final  . MCHC 04/18/2016 33.4  30.0 - 36.0 g/dL Final  . RDW 04/18/2016 14.5  11.5 - 15.5 % Final  . Testosterone 04/18/2016 298.51* 300.00 - 890.00 ng/dL Final      Medication List       This list is accurate as of: 04/26/16 10:18 AM.  Always use your most recent med list.               aspirin 81 MG tablet  Take 81 mg by mouth daily.     Azelastine HCl 0.15 % Soln     cholecalciferol 1000 units tablet  Commonly  known as:  VITAMIN D  Take 1,000 Units by mouth daily.     clonazePAM 0.5 MG tablet  Commonly known as:  KLONOPIN  Take 1 tablet (0.5 mg total) by mouth 2 (two) times daily as needed for anxiety.     diclofenac 75 MG EC tablet  Commonly known as:  VOLTAREN  Take 1 tablet (75 mg total) by mouth 2 (two) times daily.     Dulaglutide 0.75 MG/0.5ML Sopn  Commonly known as:  TRULICITY  INJECT INTO THE SKIN ONCE A  WEEK     EPIPEN 2-PAK 0.3 mg/0.3 mL Soaj injection  Generic drug:  EPINEPHrine     fluticasone 50 MCG/ACT nasal spray  Commonly known as:  FLONASE     FREESTYLE LITE test strip  Generic drug:  glucose blood  USE AS INSTRUCTED TO CHECK BLOOD SUGAR ONCE A DAY     lovastatin 20 MG tablet  Commonly known as:  MEVACOR  TAKE 1 TABLET (20 MG TOTAL) BY MOUTH EVERY MORNING.     metFORMIN 500 MG 24 hr tablet  Commonly known as:  GLUCOPHAGE-XR  TAKE 4 TABLETS ONCE DAILY     pioglitazone 30 MG tablet  Commonly known as:  ACTOS  TAKE 1 TABLET DAILY     repaglinide 1 MG tablet  Commonly known as:  PRANDIN  TAKE 1 TABLET BEFORE DINNER AND 2 BEFORE BREAKFAST     tadalafil 20 MG tablet  Commonly known as:  CIALIS  Take 1 tablet (20 mg total) by mouth daily as needed for erectile dysfunction.        Allergies: No Known Allergies  Past Medical History  Diagnosis Date  . Allergy   . Diabetes mellitus without complication [facility])     Past Surgical History  Procedure Laterality Date  . Eye surgery  01/2013    Eyelid surgery    Family History  Problem Relation Age of Onset  . Diabetes Mother   . Arthritis Mother   . Diabetes Brother   . Cancer Neg Hx   . Heart disease Neg Hx     Social History:  reports that he has never smoked. He has never used smokeless tobacco. He reports that he does not drink alcohol or use illicit drugs.   Review of Systems  Constitutional: Positive for weight loss.  HENT: Negative for headaches.        Seasonal allergy present, uses Flonase doing flareups.  Has been treated with allergist with injections  Eyes: Negative for blurred vision.  Respiratory: Negative for daytime sleepiness and shortness of breath.        No snoring at night  Cardiovascular: Negative for chest pain.  Gastrointestinal: Negative for constipation and diarrhea.  Endocrine: Positive for cold intolerance and erectile dysfunction. Negative for fatigue and decreased libido.        Has mild chronic cold intolerance Has had long-standing mild erectile dysfunction, using Cialis only occasionally because of cost  Genitourinary: Negative for frequency, nocturia and slow stream.  Musculoskeletal: Negative for joint pain and muscle aches.  Skin: Negative for rash.  Neurological: Negative for numbness and tingling.  Psychiatric/Behavioral: Positive for insomnia.Negative for depressed mood.       Has had some insomnia on and off, usually waking up at 4 PM.  Does not feel fatigued during the day    Review of Systems:    HYPERLIPIDEMIA: The lipid abnormality consists of elevated LDL and triglycerides and low HDL, usually well controlled with lovastatin except  for low HDL   Lab Results  Component Value Date   CHOL 121 04/18/2016   HDL 34.60* 04/18/2016   LDLCALC 64 04/18/2016   TRIG 115.0 04/18/2016   CHOLHDL 4 04/18/2016    He previously had transient hypogonadotropic hypogonadism which subsequently had resolved.  Was previously treated with AndroGel.  He does not have decreased libido and no fatigue   Lab Results  Component Value Date   TESTOSTERONE 298.51* 04/18/2016    Last foot exam in 10/16 was normal.  He takes vitamin D 1000 units as a supplement  No results found for: VD25OH      PREVENTIVE CARE:  Aspirin: 81 mg Lipids:   LDL 64   Colonoscopy  09/2010  PSA:  Not indicated   Yearly flu vaccine:   Yes Pneumovax: 2/09  Eye exams: Annual  Zostavax:  2015                   Examination:   BP 136/92 mmHg  Pulse 72  Ht 5\' 10"  (1.778 m)  Wt 182 lb (82.555 kg)  BMI 26.11 kg/m2  SpO2 95%  Body mass index is 26.11 kg/(m^2).    Physical Exam  Constitutional: He is oriented to person, place, and time. He appears well-developed and well-nourished.  HENT:  Mouth/Throat: Oropharynx is clear and moist. No oropharyngeal exudate.  Left tympanic membrane slightly dull, right normal    Neck: No thyromegaly present.  Cardiovascular: Regular rhythm and normal heart sounds.  Exam reveals no gallop.   No murmur heard. Pulmonary/Chest: Breath sounds normal. He has no wheezes. He has no rales.  Mild gynecomastia on the left, minimal on the right  Abdominal: He exhibits no mass. There is no tenderness.  Genitourinary: Rectum normal.  1+ enlargement of prostate,  firm, no nodule, uniform  Musculoskeletal: He exhibits no edema.  Lymphadenopathy:    He has no cervical adenopathy.  Neurological: He is alert and oriented to person, place, and time. He displays no atrophy. No cranial nerve deficit or sensory deficit.  Reflex Scores:      Bicep reflexes are 2+ on the right side and 2+ on the left side.      Achilles reflexes are 0 on the right side and 0 on the left side. Vibration sense is mildly reduced and toes  Skin: No pallor.  Psychiatric: He has a normal mood and affect.   Diabetic Foot Exam - Simple   Simple Foot Form  Diabetic Foot exam was performed with the following findings:  Yes 04/26/2016 10:51 AM  Visual Inspection  No deformities, no ulcerations, no other skin breakdown bilaterally:  Yes  Sensation Testing  Intact to touch and monofilament testing bilaterally:  Yes  Pulse Check  Posterior Tibialis and Dorsalis pulse intact bilaterally:  Yes  Comments       ASSESSMENT/ PLAN:    Diabetes type 2   See history of present illness for detailed discussion of his current management, blood sugar patterns and problems identified  Although his A1c is still below 7% and somewhat better at 6.8 now On multiple drugs currently including Trulicity A999333 He thinks he has adequate control of portions with A999333 mg Trulicity Recently he does not have many readings on his home monitoring  He has lost weight with improved diet and continuing exercise He will continue the same medications He can try to take Prandin  in the evening when he is planning a big  meal  HYPERLIPIDEMIA: Has good control,  continue Mevacor  Probable mild hypertension: Since blood pressure is significantly high and he has diabetes will start him on ramipril 2.5 mg daily  OTHER active problems discussed today were:   Low testosterone level.  He is not symptomatic and will continue to observe  Erectile dysfunction likely to be from diabetes.  He does benefit from Cialis but using it in a limited way because of cost  Allergic rhinitis, well-controlled seasonally  Late insomnia, not associated with depression  PREVENTIVE care: Prevnar given today Stool Hemoccult given today   There are no Patient Instructions on file for this visit.   Jazzmon Prindle 04/26/2016, 10:18 AM   Note: This office note was prepared with Dragon voice recognition system technology. Any transcriptional errors that result from this process are unintentional.

## 2016-05-12 ENCOUNTER — Other Ambulatory Visit: Payer: Self-pay | Admitting: Endocrinology

## 2016-05-29 ENCOUNTER — Other Ambulatory Visit (INDEPENDENT_AMBULATORY_CARE_PROVIDER_SITE_OTHER): Payer: 59

## 2016-05-29 DIAGNOSIS — K5641 Fecal impaction: Secondary | ICD-10-CM

## 2016-05-30 ENCOUNTER — Telehealth: Payer: Self-pay | Admitting: Endocrinology

## 2016-05-30 DIAGNOSIS — Z1212 Encounter for screening for malignant neoplasm of rectum: Secondary | ICD-10-CM

## 2016-05-30 NOTE — Addendum Note (Signed)
Addended by: Verlin Grills T on: 05/30/2016 04:05 PM   Modules accepted: Orders

## 2016-05-30 NOTE — Telephone Encounter (Signed)
IFOB order needs to be placed please for the pt

## 2016-05-30 NOTE — Telephone Encounter (Signed)
Order submitted

## 2016-06-01 LAB — FECAL OCCULT BLOOD, IMMUNOCHEMICAL: Fecal Occult Bld: NEGATIVE

## 2016-06-07 ENCOUNTER — Other Ambulatory Visit: Payer: Self-pay

## 2016-06-07 MED ORDER — PIOGLITAZONE HCL 30 MG PO TABS: 30.0000 mg | ORAL_TABLET | Freq: Every day | ORAL | 1 refills | Status: DC

## 2016-06-08 ENCOUNTER — Encounter: Payer: Self-pay | Admitting: Endocrinology

## 2016-06-08 ENCOUNTER — Telehealth: Payer: Self-pay | Admitting: Endocrinology

## 2016-06-08 ENCOUNTER — Ambulatory Visit (INDEPENDENT_AMBULATORY_CARE_PROVIDER_SITE_OTHER): Payer: 59 | Admitting: Endocrinology

## 2016-06-08 ENCOUNTER — Other Ambulatory Visit: Payer: Self-pay

## 2016-06-08 VITALS — BP 138/88 | HR 80 | Wt 182.0 lb

## 2016-06-08 DIAGNOSIS — E119 Type 2 diabetes mellitus without complications: Secondary | ICD-10-CM

## 2016-06-08 DIAGNOSIS — R030 Elevated blood-pressure reading, without diagnosis of hypertension: Secondary | ICD-10-CM | POA: Diagnosis not present

## 2016-06-08 DIAGNOSIS — IMO0001 Reserved for inherently not codable concepts without codable children: Secondary | ICD-10-CM

## 2016-06-08 MED ORDER — PIOGLITAZONE HCL 30 MG PO TABS: 30.0000 mg | ORAL_TABLET | Freq: Every day | ORAL | 1 refills | Status: DC

## 2016-06-08 NOTE — Progress Notes (Signed)
Patient ID: Joseph Sharp, male   DOB: August 06, 1951, 65 y.o.   MRN: [id number]   Reason for Appointment: follow-up for blood pressure   History of Present Illness   HYPERTENSION:  On his recent complete physical exam in 6/17 his blood pressure was significantly high and he was started on ramipril 2.5 mg daily He has been using a wrist monitor for measuring his blood pressure His blood pressure has been quite variable with some good readings He thinks that over the last 2 weeks his blood pressure has been mostly high because of personal stressors Also more recently has been eating out more and not exercising as usual  Today his blood pressure was 134/84, similar to the office reading No side effects with ramipril  BP Readings from Last 3 Encounters:  06/08/16 138/88  04/26/16 132/88  12/21/15 136/82    Following is a copy of the previous note.  He has not checked his blood sugar much since his last visit  Diagnosis: Type 2 DIABETES MELITUS, diagnosed in 1977     PAST history: He has had long-standing diabetes managed with multiple drugs and usually well controlled His blood sugar control is somewhat dependent on his compliance with diet and exercise regimen  In 11 /14 his blood sugars were higher overall and A1c was 7% which was higher than usual Because of this he was started on Trulicity injections, A999333 mg He did benefit overall from starting A999333 mg Trulicity  RECENT history:  Non-insulin hypoglycemic drugs: Prandin 1 mg in a.m., metformin 2g, pioglitazone 30 mg, Trulicity A999333 mg weekly  Current management, blood sugar patterns and problems identified:  He is checking his blood sugars infrequently now because of his running out of test strips  He is taking his Trulicity every Saturday and usually can control portions with this  He says that more recently has been trying to be better on his diet and eating more vegetables and less carbohydrate  Has been  able to lose weight  Although he has exercised 3 days week he was doing more previously  Has only sporadic readings at home which again seemed to indicate slightly high fasting readings  He was told to take Prandin at suppertime for larger meals but has not done so.  However he tends to forget to take his medications in the evenings  No readings after supper  Side effects from medications: None  Monitors blood glucose: rarely    Glucometer:  FreeStyle         Blood Glucose readings recently from monitor download:   He has checked blood sugars only on 2 separate days recently Fasting readings 128, 135, nonfasting 122, 140  No hypoglycemia          Diet: Usually controlled with smaller portions. Has a egg and toast at breakfast usually getting low fat meals More Vegs  EXERCISE: Has been to the gym, 30 min 3/7 for the last 2 weeks    Wt Readings from Last 3 Encounters:  06/08/16 182 lb (82.6 kg)  04/26/16 182 lb (82.6 kg)  12/21/15 187 lb 12.8 oz (85.2 kg)   Lab Results  Component Value Date   HGBA1C 6.6 (H) 04/18/2016   HGBA1C 6.8 (H) 12/16/2015   HGBA1C 6.7 (H) 08/13/2015   Lab Results  Component Value Date   MICROALBUR <0.7 04/18/2016   LDLCALC 64 04/18/2016   CREATININE 1.01 04/18/2016       Medication List  Accurate as of 06/08/16  4:54 PM. Always use your most recent med list.          aspirin 81 MG tablet Take 81 mg by mouth daily.   Azelastine HCl 0.15 % Soln   cholecalciferol 1000 units tablet Commonly known as:  VITAMIN D Take 1,000 Units by mouth daily.   Dulaglutide 0.75 MG/0.5ML Sopn Commonly known as:  TRULICITY INJECT INTO THE SKIN ONCE A WEEK   EPIPEN 2-PAK 0.3 mg/0.3 mL Soaj injection Generic drug:  EPINEPHrine   fluticasone 50 MCG/ACT nasal spray Commonly known as:  FLONASE   FREESTYLE LITE test strip Generic drug:  glucose blood USE AS INSTRUCTED TO CHECK BLOOD SUGAR ONCE A DAY   lovastatin 20 MG tablet Commonly known  as:  MEVACOR TAKE 1 TABLET (20 MG TOTAL) BY MOUTH EVERY MORNING.   metFORMIN 500 MG 24 hr tablet Commonly known as:  GLUCOPHAGE-XR TAKE 4 TABLETS ONCE DAILY   pioglitazone 30 MG tablet Commonly known as:  ACTOS TAKE 1 TABLET DAILY   pioglitazone 30 MG tablet Commonly known as:  ACTOS Take 1 tablet (30 mg total) by mouth daily.   ramipril 2.5 MG capsule Commonly known as:  ALTACE Take 1 capsule (2.5 mg total) by mouth daily.   repaglinide 1 MG tablet Commonly known as:  PRANDIN TAKE 1 TABLET BEFORE DINNER AND 2 BEFORE BREAKFAST   tadalafil 20 MG tablet Commonly known as:  CIALIS Take 1 tablet (20 mg total) by mouth daily as needed for erectile dysfunction.       Allergies: No Known Allergies  Past Medical History:  Diagnosis Date  . Allergy   . Diabetes mellitus without complication [facility])     Past Surgical History:  Procedure Laterality Date  . EYE SURGERY  01/2013   Eyelid surgery    Family History  Problem Relation Age of Onset  . Diabetes Mother   . Arthritis Mother   . Diabetes Brother   . Cancer Neg Hx   . Heart disease Neg Hx     Social History:  reports that he has never smoked. He has never used smokeless tobacco. He reports that he does not drink alcohol or use drugs.   Review of Systems   Stool Hemoccult was negative   The following is a copy of previous note:  HYPERLIPIDEMIA: The lipid abnormality consists of elevated LDL and triglycerides and low HDL, usually well controlled with lovastatin except for low HDL   Lab Results  Component Value Date   CHOL 121 04/18/2016   HDL 34.60 (L) 04/18/2016   LDLCALC 64 04/18/2016   TRIG 115.0 04/18/2016   CHOLHDL 4 04/18/2016    He previously had transient hypogonadotropic hypogonadism which subsequently had resolved.  Was previously treated with AndroGel.  He does not have decreased libido and no fatigue   Lab Results  Component Value Date   TESTOSTERONE 298.51 (L) 04/18/2016    Last foot  exam in 10/16 was normal.  He takes vitamin D 1000 units as a supplement  No results found for: VD25OH         Examination:   BP 138/88 (BP Location: Left Arm, Patient Position: Sitting)   Pulse 80   Wt 182 lb (82.6 kg)   SpO2 97%   BMI 26.11 kg/m   Body mass index is 26.11 kg/m.    Repeat blood pressure = 138/82  Physical Exam   ASSESSMENT/ PLAN:    HYPERTENSION: This is mild.  Blood pressure  has been relatively variable at home He has had some good readings but he thinks his high readings recently are related to stress Also has not been walking for exercise recently and eating out more than usual  Blood pressure is near normal today in the office and will continue the same management Need to have follow-up BMP done today    There are no Patient Instructions on file for this visit.   Hessie Varone 06/08/2016, 4:54 PM   Note: This office note was prepared with Dragon voice recognition system technology. Any transcriptional errors that result from this process are unintentional.

## 2016-06-08 NOTE — Telephone Encounter (Signed)
Patient need a refill of medication pioglitazone (ACTOS) 30 MG tablet [facility] #[id number] [address], [place] (Phone) [phone] (Fax)

## 2016-06-08 NOTE — Patient Instructions (Signed)
Exercise

## 2016-06-09 ENCOUNTER — Other Ambulatory Visit: Payer: Self-pay | Admitting: Endocrinology

## 2016-06-09 LAB — BASIC METABOLIC PANEL
BUN: 19 mg/dL (ref 6–23)
CHLORIDE: 106 meq/L (ref 96–112)
CO2: 26 meq/L (ref 19–32)
CREATININE: 1.04 mg/dL (ref 0.40–1.50)
Calcium: 9.9 mg/dL (ref 8.4–10.5)
GFR: 76.57 mL/min (ref >60.00)
Glucose, Bld: 102 mg/dL — ABNORMAL HIGH (ref 70–99)
Potassium: 4.1 mEq/L (ref 3.5–5.1)
Sodium: 141 mEq/L (ref 135–145)

## 2016-06-22 ENCOUNTER — Other Ambulatory Visit: Payer: Self-pay | Admitting: Endocrinology

## 2016-06-23 ENCOUNTER — Other Ambulatory Visit: Payer: Self-pay | Admitting: Endocrinology

## 2016-06-26 ENCOUNTER — Other Ambulatory Visit: Payer: Self-pay | Admitting: Endocrinology

## 2016-06-27 ENCOUNTER — Other Ambulatory Visit: Payer: Self-pay

## 2016-06-27 MED ORDER — RAMIPRIL 2.5 MG PO CAPS: 2.5000 mg | ORAL_CAPSULE | Freq: Every day | ORAL | 3 refills | Status: DC

## 2016-06-30 MED ORDER — RAMIPRIL 2.5 MG PO CAPS: 2.5000 mg | ORAL_CAPSULE | Freq: Every day | ORAL | 1 refills | Status: DC

## 2016-07-24 ENCOUNTER — Other Ambulatory Visit: Payer: Self-pay | Admitting: Endocrinology

## 2016-08-03 ENCOUNTER — Other Ambulatory Visit (INDEPENDENT_AMBULATORY_CARE_PROVIDER_SITE_OTHER): Payer: 59

## 2016-08-03 ENCOUNTER — Other Ambulatory Visit: Payer: Self-pay | Admitting: Endocrinology

## 2016-08-03 DIAGNOSIS — E119 Type 2 diabetes mellitus without complications: Secondary | ICD-10-CM

## 2016-08-03 DIAGNOSIS — Z1159 Encounter for screening for other viral diseases: Secondary | ICD-10-CM

## 2016-08-03 LAB — COMPREHENSIVE METABOLIC PANEL
ALBUMIN: 4.2 g/dL (ref 3.5–5.2)
ALK PHOS: 59 U/L (ref 39–117)
ALT: 14 U/L (ref 0–53)
AST: 14 U/L (ref 0–37)
BILIRUBIN TOTAL: 0.3 mg/dL (ref 0.2–1.2)
BUN: 18 mg/dL (ref 6–23)
CO2: 27 mEq/L (ref 19–32)
Calcium: 10.2 mg/dL (ref 8.4–10.5)
Chloride: 109 mEq/L (ref 96–112)
Creatinine, Ser: 1.02 mg/dL (ref 0.40–1.50)
GFR: 78.27 mL/min (ref >60.00)
GLUCOSE: 93 mg/dL (ref 70–99)
Potassium: 4.4 mEq/L (ref 3.5–5.1)
Sodium: 144 mEq/L (ref 135–145)
TOTAL PROTEIN: 7.1 g/dL (ref 6.0–8.3)

## 2016-08-03 LAB — HEMOGLOBIN A1C: HEMOGLOBIN A1C: 6.7 % — AB (ref 4.6–6.5)

## 2016-08-07 LAB — HEPATITIS C ANTIBODY

## 2016-08-08 ENCOUNTER — Ambulatory Visit (INDEPENDENT_AMBULATORY_CARE_PROVIDER_SITE_OTHER): Payer: 59 | Admitting: Endocrinology

## 2016-08-08 ENCOUNTER — Encounter: Payer: Self-pay | Admitting: Endocrinology

## 2016-08-08 VITALS — BP 110/72 | HR 73 | Temp 97.7°F | Resp 16 | Ht 70.0 in | Wt 183.0 lb

## 2016-08-08 DIAGNOSIS — E1165 Type 2 diabetes mellitus with hyperglycemia: Secondary | ICD-10-CM

## 2016-08-08 DIAGNOSIS — Z23 Encounter for immunization: Secondary | ICD-10-CM | POA: Diagnosis not present

## 2016-08-08 NOTE — Progress Notes (Signed)
Patient ID: Joseph Sharp, male   DOB: 1950/12/09, 65 y.o.   MRN: [id number]   Reason for Appointment: follow-up for diabetes and blood pressure   History of Present Illness    Diagnosis: Type 2 DIABETES MELITUS, diagnosed in 1977     PAST history: He has had long-standing diabetes managed with multiple drugs and usually well controlled His blood sugar control is somewhat dependent on his compliance with diet and exercise regimen  In 11 /14 his blood sugars were higher overall and A1c was 7% which was higher than usual Because of this he was started on Trulicity injections, A999333 mg He did benefit overall from starting A999333 mg Trulicity  RECENT history:  Non-insulin hypoglycemic drugs: Prandin 1 mg in a.m., metformin 2g, pioglitazone 30 mg, Trulicity A999333 mg weekly  Current management, blood sugar patterns and problems identified:  He is checking his blood sugars At different times of the day  His only high readings are after breakfast and he says that he is having fruit and juice both at the same time in the morning  He is taking his Trulicity every Saturday and usually this helps with satiety  Weight is about the same  He has not exercised as much and has not had as much motivation also  Side effects from medications: None  Monitors blood glucose: rarely    Glucometer:  FreeStyle         Blood Glucose readings recently from monitor download:   Mean values apply above for all meters except median for One Touch  PRE-MEAL Fasting Lunch Dinner Bedtime Overall  Glucose range: 117-144  107-121  98-118    Mean/median:     136    POST-MEAL PC Breakfast PC Lunch PC Dinner  Glucose range: 156-222   130-139   Mean/median:        He has checked blood sugars only on 2 separate days recently Fasting readings 128, 135, nonfasting 122, 140  No hypoglycemia          Diet: Usually controlled with smaller portions. Has a egg and toast at breakfast usually getting  low fat meals More Vegetables  EXERCISE: Has been to the gym, 30 min 0-3/7     Wt Readings from Last 3 Encounters:  08/08/16 183 lb (83 kg)  06/08/16 182 lb (82.6 kg)  04/26/16 182 lb (82.6 kg)   Lab Results  Component Value Date   HGBA1C 6.7 (H) 08/03/2016   HGBA1C 6.6 (H) 04/18/2016   HGBA1C 6.8 (H) 12/16/2015   Lab Results  Component Value Date   MICROALBUR <0.7 04/18/2016   LDLCALC 64 04/18/2016   CREATININE 1.02 08/03/2016       Medication List       Accurate as of 08/08/16 11:59 PM. Always use your most recent med list.          aspirin 81 MG tablet Take 81 mg by mouth daily.   Azelastine HCl 0.15 % Soln   cholecalciferol 1000 units tablet Commonly known as:  VITAMIN D Take 1,000 Units by mouth daily.   EPIPEN 2-PAK 0.3 mg/0.3 mL Soaj injection Generic drug:  EPINEPHrine   fluticasone 50 MCG/ACT nasal spray Commonly known as:  FLONASE   FREESTYLE LITE test strip Generic drug:  glucose blood USE AS INSTRUCTED TO CHECK BLOOD SUGAR ONCE A DAY   lovastatin 20 MG tablet Commonly known as:  MEVACOR TAKE 1 TABLET BY MOUTH EVERY DAY   metFORMIN 500 MG 24 hr  tablet Commonly known as:  GLUCOPHAGE-XR TAKE 4 TABLETS ONCE DAILY   pioglitazone 30 MG tablet Commonly known as:  ACTOS Take 1 tablet (30 mg total) by mouth daily.   ramipril 2.5 MG capsule Commonly known as:  ALTACE Take 1 capsule (2.5 mg total) by mouth daily.   repaglinide 1 MG tablet Commonly known as:  PRANDIN TAKE 1 TABLET BEFORE DINNER AND 2 BEFORE BREAKFAST   tadalafil 20 MG tablet Commonly known as:  CIALIS Take 1 tablet (20 mg total) by mouth daily as needed for erectile dysfunction.   TRULICITY A999333 0000000 Sopn Generic drug:  Dulaglutide INJECT INTO THE SKIN ONCE A WEEK       Allergies: No Known Allergies  Past Medical History:  Diagnosis Date  . Allergy   . Diabetes mellitus without complication [facility])     Past Surgical History:  Procedure Laterality Date  .  EYE SURGERY  01/2013   Eyelid surgery    Family History  Problem Relation Age of Onset  . Diabetes Mother   . Arthritis Mother   . Diabetes Brother   . Cancer Neg Hx   . Heart disease Neg Hx     Social History:  reports that he has never smoked. He has never used smokeless tobacco. He reports that he does not drink alcohol or use drugs.   Review of Systems   He had mild increase in blood pressure at times but today his home blood pressure monitor is to be reading falsely high  HYPERLIPIDEMIA: The lipid abnormality consists of elevated LDL and triglycerides and low HDL, usually well controlled with lovastatin except for low HDL   Lab Results  Component Value Date   CHOL 121 04/18/2016   HDL 34.60 (L) 04/18/2016   LDLCALC 64 04/18/2016   TRIG 115.0 04/18/2016   CHOLHDL 4 04/18/2016    He previously had transient hypogonadotropic hypogonadism which subsequently had resolved.  Was previously treated with AndroGel.  He does not have decreased libido and no fatigue   Lab Results  Component Value Date   TESTOSTERONE 298.51 (L) 04/18/2016    Last foot exam in 10/16 was normal.  He takes vitamin D 1000 units as a supplement  No results found for: VD25OH    He had asked for hepatitis C screening and this was negative     Examination:   BP 110/72   Pulse 73   Temp 97.7 F (36.5 C)   Resp 16   Ht 5\' 10"  (1.778 m)   Wt 183 lb (83 kg)   SpO2 96%   BMI 26.26 kg/m   Body mass index is 26.26 kg/m.    Repeat blood pressure 118/70   Physical Exam   ASSESSMENT/ PLAN:    DIABETES: His glucose is fairly well controlled with multidrug regimen See history of present illness for detailed discussion of current diabetes management, blood sugar patterns and problems identified A1c is stable at 6.7 Only has high readings after breakfast fairly consistently and asked him to stop either juice or fruit at breakfast Recommended restarting regular exercise  He does not have high  blood pressure    There are no Patient Instructions on file for this visit.   Remonia Otte 08/09/2016, 8:50 AM   Note: This office note was prepared with Dragon voice recognition system technology. Any transcriptional errors that result from this process are unintentional.

## 2016-09-25 ENCOUNTER — Other Ambulatory Visit: Payer: Self-pay | Admitting: Endocrinology

## 2016-09-30 ENCOUNTER — Other Ambulatory Visit: Payer: Self-pay | Admitting: Endocrinology

## 2016-12-05 ENCOUNTER — Other Ambulatory Visit (INDEPENDENT_AMBULATORY_CARE_PROVIDER_SITE_OTHER): Payer: 59

## 2016-12-05 DIAGNOSIS — E1165 Type 2 diabetes mellitus with hyperglycemia: Secondary | ICD-10-CM

## 2016-12-05 LAB — LIPID PANEL
CHOL/HDL RATIO: 3
Cholesterol: 113 mg/dL (ref 0–200)
HDL: 35 mg/dL — ABNORMAL LOW (ref >39.00)
LDL CALC: 63 mg/dL (ref 0–99)
NonHDL: 78.3
TRIGLYCERIDES: 78 mg/dL (ref 0.0–149.0)
VLDL: 15.6 mg/dL (ref 0.0–40.0)

## 2016-12-05 LAB — COMPREHENSIVE METABOLIC PANEL
ALT: 13 U/L (ref 0–53)
AST: 12 U/L (ref 0–37)
Albumin: 4.3 g/dL (ref 3.5–5.2)
Alkaline Phosphatase: 60 U/L (ref 39–117)
BUN: 20 mg/dL (ref 6–23)
CALCIUM: 10.3 mg/dL (ref 8.4–10.5)
CHLORIDE: 107 meq/L (ref 96–112)
CO2: 27 meq/L (ref 19–32)
CREATININE: 1.04 mg/dL (ref 0.40–1.50)
GFR: 76.45 mL/min (ref >60.00)
Glucose, Bld: 166 mg/dL — ABNORMAL HIGH (ref 70–99)
POTASSIUM: 4.2 meq/L (ref 3.5–5.1)
SODIUM: 140 meq/L (ref 135–145)
Total Bilirubin: 0.4 mg/dL (ref 0.2–1.2)
Total Protein: 6.8 g/dL (ref 6.0–8.3)

## 2016-12-05 LAB — HEMOGLOBIN A1C: Hgb A1c MFr Bld: 6.9 % — ABNORMAL HIGH (ref 4.6–6.5)

## 2016-12-08 ENCOUNTER — Other Ambulatory Visit: Payer: Self-pay | Admitting: Endocrinology

## 2016-12-08 ENCOUNTER — Ambulatory Visit (INDEPENDENT_AMBULATORY_CARE_PROVIDER_SITE_OTHER): Payer: 59 | Admitting: Endocrinology

## 2016-12-08 ENCOUNTER — Encounter: Payer: Self-pay | Admitting: Endocrinology

## 2016-12-08 VITALS — BP 108/70 | HR 80 | Ht 70.0 in | Wt 179.0 lb

## 2016-12-08 DIAGNOSIS — E1165 Type 2 diabetes mellitus with hyperglycemia: Secondary | ICD-10-CM

## 2016-12-08 DIAGNOSIS — E559 Vitamin D deficiency, unspecified: Secondary | ICD-10-CM

## 2016-12-08 MED ORDER — GLIPIZIDE ER 2.5 MG PO TB24: 2.5000 mg | ORAL_TABLET | Freq: Every day | ORAL | 2 refills | Status: DC

## 2016-12-08 NOTE — Patient Instructions (Signed)
Check blood sugars on waking up  2-3x weekly  Also check blood sugars about 2 hours after a meal and do this after different meals by rotation  Recommended blood sugar levels on waking up is 90-130 and about 2 hours after meal is 130-160  Please bring your blood sugar monitor to each visit, thank you  Replace Prandin with Glipizide

## 2016-12-08 NOTE — Progress Notes (Signed)
Patient ID: Joseph Sharp, male   DOB: [birth year], 66 y.o.   MRN: [id number]   Reason for Appointment: follow-up for diabetes and blood pressure   History of Present Illness    Diagnosis: Type 2 DIABETES MELITUS, diagnosed in 1977     PAST history: He has had long-standing diabetes managed with multiple drugs and usually well controlled His blood sugar control is somewhat dependent on his compliance with diet and exercise regimen  In 11 /14 his blood sugars were higher overall and A1c was 7% which was higher than usual Because of this he was started on Trulicity injections, A999333 mg He did benefit overall from starting A999333 mg Trulicity  RECENT history:  His A1c appears to be gradually getting higher now 6.9, previously 6.6 last summer  Non-insulin hypoglycemic drugs: Prandin 1 mg in a.m., metformin 2g, pioglitazone 30 mg, Trulicity A999333 mg weekly  Current management, blood sugar patterns and problems identified:  He is checking his blood sugars At different times of the day  His readings are now more consistently high in the mornings especially early in the morning  Also periodically will have some high readings after lunch and supper although postprandial readings are not clearly marked  For various reasons including recent respiratory infection he has not been doing exercise as much lately  His weight has gone down  He now says that Trulicity does not know certainly cause enough satiety  He prefers taking all his medications in the mornings and will not remember taking them later in the day  Still probably getting more carbohydrates at times including breakfast  Side effects from medications: None  Monitors blood glucose:  1+ times a day     Glucometer:  FreeStyle         Blood Glucose readings recently from monitor download:   Mean values apply above for all meters except median for One Touch  PRE-MEAL Fasting Lunch Dinner Bedtime Overall  Glucose  range: 124-167  116-164  105-200  162, 190    Mean/median: 147  144   150    No hypoglycemia          Diet: Usually controlled with smaller portions. Has a egg and toast at breakfast with small amount of juice and fruit also, usually getting low fat meals, tries to increase vegetables  EXERCISE: Has been to the gym irregularly, previously doing 30 min 0-3/7     Wt Readings from Last 3 Encounters:  12/08/16 179 lb (81.2 kg)  08/08/16 183 lb (83 kg)  06/08/16 182 lb (82.6 kg)   Lab Results  Component Value Date   HGBA1C 6.9 (H) 12/05/2016   HGBA1C 6.7 (H) 08/03/2016   HGBA1C 6.6 (H) 04/18/2016   Lab Results  Component Value Date   MICROALBUR <0.7 04/18/2016   [place] 65 12/05/2016   CREATININE 1.04 12/05/2016     Allergies as of 12/08/2016   No Known Allergies     Medication List       Accurate as of 12/08/16 11:59 PM. Always use your most recent med list.          aspirin 81 MG tablet Take 81 mg by mouth daily.   Azelastine HCl 0.15 % Soln   cholecalciferol 1000 units tablet Commonly known as:  VITAMIN D Take 1,000 Units by mouth daily.   EPIPEN 2-PAK 0.3 mg/0.3 mL Soaj injection Generic drug:  EPINEPHrine   fluticasone 50 MCG/ACT nasal spray Commonly known as:  FLONASE  FREESTYLE LITE test strip Generic drug:  glucose blood USE AS INSTRUCTED TO CHECK BLOOD SUGAR ONCE A DAY   glipiZIDE 2.5 MG 24 hr tablet Commonly known as:  GLUCOTROL XL Take 1 tablet (2.5 mg total) by mouth daily with breakfast.   lovastatin 20 MG tablet Commonly known as:  MEVACOR TAKE 1 TABLET BY MOUTH EVERY DAY   metFORMIN 500 MG 24 hr tablet Commonly known as:  GLUCOPHAGE-XR TAKE 4 TABLETS BY MOUTH EVERY DAY   pioglitazone 30 MG tablet Commonly known as:  ACTOS Take 1 tablet (30 mg total) by mouth daily.   ramipril 2.5 MG capsule Commonly known as:  ALTACE TAKE ONE CAPSULE BY MOUTH EVERY DAY   tadalafil 20 MG tablet Commonly known as:  CIALIS Take 1 tablet (20 mg  total) by mouth daily as needed for erectile dysfunction.   TRULICITY A999333 0000000 Sopn Generic drug:  Dulaglutide INJECT INTO THE SKIN ONCE A WEEK       Allergies: No Known Allergies  Past Medical History:  Diagnosis Date  . Allergy   . Diabetes mellitus without complication [facility])     Past Surgical History:  Procedure Laterality Date  . EYE SURGERY  01/2013   Eyelid surgery    Family History  Problem Relation Age of Onset  . Diabetes Mother   . Arthritis Mother   . Diabetes Brother   . Cancer Neg Hx   . Heart disease Neg Hx     Social History:  reports that he has never smoked. He has never used smokeless tobacco. He reports that he does not drink alcohol or use drugs.   Review of Systems    HYPERLIPIDEMIA: The lipid abnormality consists of elevated LDL and triglycerides and low HDL, usually well controlled with lovastatin Has low HDL from metabolic syndrome   Lab Results  Component Value Date   CHOL 113 12/05/2016   HDL 35.00 (L) 12/05/2016   LDLCALC 63 12/05/2016   TRIG 78.0 12/05/2016   CHOLHDL 3 12/05/2016    He previously had transient hypogonadotropic hypogonadism which subsequently had resolved.  Was previously treated with AndroGel.  He does not have decreased libido Or recent fatigue   Lab Results  Component Value Date   TESTOSTERONE 298.51 (L) 04/18/2016    Last foot exam in 6/17 was normal.  He takes vitamin D 1000 units as a supplement  No results found for: VD25OH        Examination:   BP 108/70   Pulse 80   Ht 5\' 10"  (1.778 m)   Wt 179 lb (81.2 kg)   SpO2 97%   BMI 25.68 kg/m   Body mass index is 25.68 kg/m.    No ankle edema   Physical Exam   ASSESSMENT/ PLAN:    DIABETES:  See history of present illness for detailed discussion of current diabetes management, blood sugar patterns and problems identified  His glucose is fairly well controlled but now fasting readings are consistently high He does have some insulin  deficiency and taking only Prandin in the morning, does not remember to take this later in the day However his weight is down recently Can do better with exercise  Recommended restarting regular exercise when able to [facility] give him a trial of low-dose glipizide instead of Prandin, this should help his fasting readings also, advised him on potential hypoglycemia    Patient Instructions  Check blood sugars on waking up  2-3x weekly  Also check blood sugars about  2 hours after a meal and do this after different meals by rotation  Recommended blood sugar levels on waking up is 90-130 and about 2 hours after meal is 130-160  Please bring your blood sugar monitor to each visit, thank you  Replace Prandin with Glipizide     Nataleigh Griffin 12/09/2016, 6:06 PM   Note: This office note was prepared with Dragon voice recognition system technology. Any transcriptional errors that result from this process are unintentional.

## 2016-12-14 ENCOUNTER — Other Ambulatory Visit: Payer: Self-pay | Admitting: Endocrinology

## 2017-01-01 ENCOUNTER — Other Ambulatory Visit: Payer: Self-pay | Admitting: Endocrinology

## 2017-01-03 ENCOUNTER — Other Ambulatory Visit: Payer: Self-pay | Admitting: Endocrinology

## 2017-02-08 ENCOUNTER — Other Ambulatory Visit: Payer: Self-pay

## 2017-02-08 MED ORDER — GLIPIZIDE ER 2.5 MG PO TB24: 2.5000 mg | ORAL_TABLET | Freq: Every day | ORAL | 2 refills | Status: DC

## 2017-03-05 LAB — HM DIABETES EYE EXAM

## 2017-03-12 ENCOUNTER — Telehealth: Payer: Self-pay

## 2017-03-12 NOTE — Telephone Encounter (Signed)
I contacted the patient and advised we have received the approval for the trulicity. Approval is good for 03/10/2017-03/10/2020.

## 2017-03-28 ENCOUNTER — Other Ambulatory Visit: Payer: Self-pay | Admitting: Endocrinology

## 2017-04-02 ENCOUNTER — Other Ambulatory Visit: Payer: Self-pay | Admitting: Endocrinology

## 2017-04-03 ENCOUNTER — Other Ambulatory Visit (INDEPENDENT_AMBULATORY_CARE_PROVIDER_SITE_OTHER): Payer: 59

## 2017-04-03 DIAGNOSIS — E1165 Type 2 diabetes mellitus with hyperglycemia: Secondary | ICD-10-CM | POA: Diagnosis not present

## 2017-04-03 DIAGNOSIS — E559 Vitamin D deficiency, unspecified: Secondary | ICD-10-CM

## 2017-04-03 LAB — BASIC METABOLIC PANEL
BUN: 21 mg/dL (ref 6–23)
CHLORIDE: 107 meq/L (ref 96–112)
CO2: 27 meq/L (ref 19–32)
CREATININE: 1.11 mg/dL (ref 0.40–1.50)
Calcium: 10 mg/dL (ref 8.4–10.5)
GFR: 70.84 mL/min (ref >60.00)
GLUCOSE: 153 mg/dL — AB (ref 70–99)
POTASSIUM: 4.6 meq/L (ref 3.5–5.1)
Sodium: 141 mEq/L (ref 135–145)

## 2017-04-03 LAB — URINALYSIS, ROUTINE W REFLEX MICROSCOPIC
Bilirubin Urine: NEGATIVE
HGB URINE DIPSTICK: NEGATIVE
KETONES UR: NEGATIVE
Leukocytes, UA: NEGATIVE
NITRITE: NEGATIVE
RBC / HPF: NONE SEEN (ref 0–?)
Specific Gravity, Urine: >=1.03 — AB (ref 1.000–1.030)
Total Protein, Urine: NEGATIVE
URINE GLUCOSE: NEGATIVE
UROBILINOGEN UA: 0.2 (ref 0.0–1.0)
WBC, UA: NONE SEEN (ref 0–?)
pH: 6 (ref 5.0–8.0)

## 2017-04-03 LAB — HEMOGLOBIN A1C: Hgb A1c MFr Bld: 7.1 % — ABNORMAL HIGH (ref 4.6–6.5)

## 2017-04-03 LAB — VITAMIN D 25 HYDROXY (VIT D DEFICIENCY, FRACTURES): VITD: 46.46 ng/mL (ref 30.00–100.00)

## 2017-04-03 LAB — MICROALBUMIN / CREATININE URINE RATIO
Creatinine,U: 173.8 mg/dL
Microalb Creat Ratio: 0.4 mg/g (ref 0.0–30.0)
Microalb, Ur: 0.7 mg/dL (ref 0.0–1.9)

## 2017-04-06 ENCOUNTER — Encounter: Payer: Self-pay | Admitting: Endocrinology

## 2017-04-06 ENCOUNTER — Ambulatory Visit (INDEPENDENT_AMBULATORY_CARE_PROVIDER_SITE_OTHER): Payer: 59 | Admitting: Endocrinology

## 2017-04-06 VITALS — BP 118/80 | HR 72 | Ht 70.0 in | Wt 182.8 lb

## 2017-04-06 DIAGNOSIS — R7989 Other specified abnormal findings of blood chemistry: Secondary | ICD-10-CM | POA: Diagnosis not present

## 2017-04-06 DIAGNOSIS — E1165 Type 2 diabetes mellitus with hyperglycemia: Secondary | ICD-10-CM

## 2017-04-06 MED ORDER — NORTRIPTYLINE HCL 10 MG PO CAPS: 10.0000 mg | ORAL_CAPSULE | Freq: Every day | ORAL | 2 refills | Status: DC

## 2017-04-06 NOTE — Progress Notes (Signed)
Patient ID: Joseph Sharp, male   DOB: [birth year], 66 y.o.   MRN: [id number]   Reason for Appointment: follow-up for diabetes and blood pressure   History of Present Illness    Diagnosis: Type 2 DIABETES MELITUS, diagnosed in 1977     PAST history: He has had long-standing diabetes managed with multiple drugs and usually well controlled His blood sugar control is somewhat dependent on his compliance with diet and exercise regimen  In 11 /14 his blood sugars were higher overall and A1c was 7% which was higher than usual Because of this he was started on Trulicity injections, 5.36 mg He did benefit overall from starting 4.68 mg Trulicity  RECENT history:  His A1c appears to be gradually getting higher and is now 7.1, has been as low as 6.6  Non-insulin hypoglycemic drugs: Glipizide ER 2.5 mg daily , metformin 2g, pioglitazone 30 mg, Trulicity 0.32 mg weekly  Current management, blood sugar patterns and problems identified:  He is checking his blood sugars with a new monitor which cannot be downloaded  Although his blood sugars at home are fairly good he is checking mostly fasting, before supper or at bedtime  Blood sugars at home are recently rarely over 150 and not clear why his A1c is high  His weight has gone up somewhat  He is not walking for exercise as before and says that he has been involved with other commitments in the evening and cannot find time to walk.  On his last visit he was switched from [place] to glipizide with which his early morning readings are relatively better but lab glucose late morning was relatively high  Side effects from medications: None  Monitors blood glucose:  1+ times a day     Glucometer:  [place]        Blood Glucose readings recently from monitor download:   Blood sugar range 89-1 64 recently with most readings below 150 Highest readings are either at 9 AM or 9 PM Fasting readings recently 116, 125 Postprandial readings  at night as low as 95  No hypoglycemia          Diet: Usually controlled with smaller portions. Has a egg and toast at breakfast with small amount of juice and fruit, usually getting low fat meals, tries to eat more at home  Dinner usually at 7 pm  EXERCISE: Minimal, previously walking 30 min at least 2-3 times a week    Wt Readings from Last 3 Encounters:  04/06/17 182 lb 12.8 oz (82.9 kg)  12/08/16 179 lb (81.2 kg)  08/08/16 183 lb (83 kg)   Lab Results  Component Value Date   HGBA1C 7.1 (H) 04/03/2017   HGBA1C 6.9 (H) 12/05/2016   HGBA1C 6.7 (H) 08/03/2016   Lab Results  Component Value Date   MICROALBUR 0.7 04/03/2017   LDLCALC 63 12/05/2016   CREATININE 1.11 04/03/2017     Allergies as of 04/06/2017   No Known Allergies     Medication List       Accurate as of 04/06/17 11:59 PM. Always use your most recent med list.          aspirin 81 MG tablet Take 81 mg by mouth daily.   Azelastine HCl 0.15 % Soln   cholecalciferol 1000 units tablet Commonly known as:  VITAMIN D Take 1,000 Units by mouth daily.   EPIPEN 2-PAK 0.3 mg/0.3 mL Soaj injection Generic drug:  EPINEPHrine   fluticasone 50 MCG/ACT nasal  spray Commonly known as:  FLONASE   FREESTYLE LITE test strip Generic drug:  glucose blood USE AS INSTRUCTED TO CHECK BLOOD SUGAR ONCE A DAY   glipiZIDE 2.5 MG 24 hr tablet Commonly known as:  GLUCOTROL XL Take 1 tablet (2.5 mg total) by mouth daily with breakfast.   lovastatin 20 MG tablet Commonly known as:  MEVACOR TAKE 1 TABLET BY MOUTH EVERY DAY   metFORMIN 500 MG 24 hr tablet Commonly known as:  GLUCOPHAGE-XR TAKE 4 TABLETS BY MOUTH EVERY DAY   nortriptyline 10 MG capsule Commonly known as:  PAMELOR Take 1 capsule (10 mg total) by mouth at bedtime.   pioglitazone 30 MG tablet Commonly known as:  ACTOS Take 1 tablet (30 mg total) by mouth daily.   ramipril 2.5 MG capsule Commonly known as:  ALTACE TAKE ONE CAPSULE BY MOUTH EVERY  DAY   tadalafil 20 MG tablet Commonly known as:  CIALIS Take 1 tablet (20 mg total) by mouth daily as needed for erectile dysfunction.   TRULICITY 0.17 BL/3.9QZ Sopn Generic drug:  Dulaglutide INJECT INTO THE SKIN ONCE A WEEK       Allergies: No Known Allergies  Past Medical History:  Diagnosis Date  . Allergy   . Diabetes mellitus without complication [facility])     Past Surgical History:  Procedure Laterality Date  . EYE SURGERY  01/2013   Eyelid surgery    Family History  Problem Relation Age of Onset  . Diabetes Mother   . Arthritis Mother   . Diabetes Brother   . Cancer Neg Hx   . Heart disease Neg Hx     Social History:  reports that he has never smoked. He has never used smokeless tobacco. He reports that he does not drink alcohol or use drugs.   Review of Systems   He is complaining of fatigue Does tend to have late insomnia but no depression or depressed mood  HYPERLIPIDEMIA: The lipid abnormality consists of elevated LDL and triglycerides and low HDL, well controlled with lovastatin Has low HDL from metabolic syndrome   Lab Results  Component Value Date   CHOL 113 12/05/2016   HDL 35.00 (L) 12/05/2016   LDLCALC 63 12/05/2016   TRIG 78.0 12/05/2016   CHOLHDL 3 12/05/2016    He previously had transient hypogonadotropic hypogonadism which subsequently had resolved.  Was previously treated with AndroGel.  More recently has some fatigue but no reduced libido   Lab Results  Component Value Date   TESTOSTERONE 298.51 (L) 04/18/2016    Last foot exam in 6/17 was normal.  He takes vitamin D 1000 units as a supplement  Lab Results  Component Value Date   VD25OH 46.46 04/03/2017          Examination:   BP 118/80   Pulse 72   Ht 5\' 10"  (1.778 m)   Wt 182 lb 12.8 oz (82.9 kg)   SpO2 98%   BMI 26.23 kg/m   Body mass index is 26.23 kg/m.       Physical Exam   ASSESSMENT/ PLAN:    DIABETES:  See history of present illness for detailed  discussion of current diabetes management, blood sugar patterns and problems identified  His glucose is not is well-controlled A1c has been gradually getting higher No obvious periods of hyperglycemia when blood sugars are checked at home but he is not usually checking 2 hours after eating except occasionally in the evening Not clear if his home monitor is  accurate, this is provided by his insurance, lab glucose was 153 fasting but his home fastings are usually 125 or less Other problems involved are lack of exercise, sometimes eating out in the evening and gaining back some weight  Recommendations: He will check to see was insurance will cover contour or other meter More readings 2 hours after meals Continue glipizide for now Consider increasing Trulicity of K7Q stays high  For his fatigue/late insomnia he can try nortriptyline 10-20 mg at bedtime, may also tried trazodone Recheck testosterone on the next visit  Patient Instructions  Check what meter is covered  Walk daily       Iridessa Harrow 04/09/2017, 2:31 PM   Note: This office note was prepared with Dragon voice recognition system technology. Any transcriptional errors that result from this process are unintentional.

## 2017-04-06 NOTE — Patient Instructions (Addendum)
Check what meter is covered  Walk daily

## 2017-06-19 ENCOUNTER — Ambulatory Visit (INDEPENDENT_AMBULATORY_CARE_PROVIDER_SITE_OTHER): Payer: 59 | Admitting: Physician Assistant

## 2017-06-19 ENCOUNTER — Encounter: Payer: Self-pay | Admitting: Physician Assistant

## 2017-06-19 VITALS — BP 131/69 | HR 96 | Temp 100.3°F | Resp 18 | Ht 70.0 in | Wt 181.8 lb

## 2017-06-19 DIAGNOSIS — R197 Diarrhea, unspecified: Secondary | ICD-10-CM

## 2017-06-19 DIAGNOSIS — R509 Fever, unspecified: Secondary | ICD-10-CM

## 2017-06-19 DIAGNOSIS — A09 Infectious gastroenteritis and colitis, unspecified: Secondary | ICD-10-CM

## 2017-06-19 LAB — POCT URINALYSIS DIP (MANUAL ENTRY)
Bilirubin, UA: NEGATIVE
Blood, UA: NEGATIVE
Glucose, UA: NEGATIVE mg/dL
Leukocytes, UA: NEGATIVE
NITRITE UA: NEGATIVE
SPEC GRAV UA: 1.025 (ref 1.010–1.025)
UROBILINOGEN UA: 0.2 U/dL
pH, UA: 5.5 (ref 5.0–8.0)

## 2017-06-19 LAB — POCT CBC
Granulocyte percent: 86.6 %G — AB (ref 37–80)
HCT, POC: 41.2 % — AB (ref 43.5–53.7)
HEMOGLOBIN: 13.9 g/dL — AB (ref 14.1–18.1)
Lymph, poc: 1.4 (ref 0.6–3.4)
MCH: 28.6 pg (ref 27–31.2)
MCHC: 33.8 g/dL (ref 31.8–35.4)
MCV: 84.6 fL (ref 80–97)
MID (cbc): 0.4 (ref 0–0.9)
MPV: 7.8 fL (ref 0–99.8)
PLATELET COUNT, POC: 233 10*3/uL (ref 142–424)
POC Granulocyte: 11.4 — AB (ref 2–6.9)
POC LYMPH PERCENT: 10.3 %L (ref 10–50)
POC MID %: 3.1 %M (ref 0–12)
RBC: 4.87 M/uL (ref 4.69–6.13)
RDW, POC: 14.2 %
WBC: 13.2 10*3/uL — AB (ref 4.6–10.2)

## 2017-06-19 LAB — GLUCOSE, POCT (MANUAL RESULT ENTRY): POC Glucose: 157 mg/dl — AB (ref 70–99)

## 2017-06-19 MED ORDER — ACETAMINOPHEN 500 MG PO TABS
500.0000 mg | ORAL_TABLET | Freq: Once | ORAL | Status: AC
  Administered 2017-06-19: 500 mg via ORAL

## 2017-06-19 MED ORDER — CIPROFLOXACIN HCL 500 MG PO TABS: 500.0000 mg | ORAL_TABLET | Freq: Two times a day (BID) | ORAL | 0 refills | Status: AC

## 2017-06-19 NOTE — Patient Instructions (Addendum)
I would like you to provide the stool sample prior to starting the antibiotic.  Please make sure you are hydrating well at home.  At least 64 oz of wtaer if not more.  I would also like you to use tylenol for your fever every 6 hours.  You can swap tylenol or ibuprofen as well, for fever.   You can mix ORS solution packs for added hydration, instead of the gatorade.  Pick this up at a pharmacy.    Food Choices to Help Relieve Diarrhea, Adult When you have diarrhea, the foods you eat and your eating habits are very important. Choosing the right foods and drinks can help:  Relieve diarrhea.  Replace lost fluids and nutrients.  Prevent dehydration.  What general guidelines should I follow? Relieving diarrhea  Choose foods with less than 2 g or .07 oz. of fiber per serving.  Limit fats to less than 8 tsp (38 g or 1.34 oz.) a day.  Avoid the following: ? Foods and beverages sweetened with high-fructose corn syrup, honey, or sugar alcohols such as xylitol, sorbitol, and mannitol. ? Foods that contain a lot of fat or sugar. ? Fried, greasy, or spicy foods. ? High-fiber grains, breads, and cereals. ? Raw fruits and vegetables.  Eat foods that are rich in probiotics. These foods include dairy products such as yogurt and fermented milk products. They help increase healthy bacteria in the stomach and intestines (gastrointestinal tract, or GI tract).  If you have lactose intolerance, avoid dairy products. These may make your diarrhea worse.  Take medicine to help stop diarrhea (antidiarrheal medicine) only as told by your health care provider. Replacing nutrients  Eat small meals or snacks every 3-4 hours.  Eat bland foods, such as white rice, toast, or baked potato, until your diarrhea starts to get better. Gradually reintroduce nutrient-rich foods as tolerated or as told by your health care provider. This includes: ? Well-cooked protein foods. ? Peeled, seeded, and soft-cooked fruits and  vegetables. ? Low-fat dairy products.  Take vitamin and mineral supplements as told by your health care provider. Preventing dehydration   Start by sipping water or a special solution to prevent dehydration (oral rehydration solution, ORS). Urine that is clear or pale yellow means that you are getting enough fluid.  Try to drink at least 8-10 cups of fluid each day to help replace lost fluids.  You may add other liquids in addition to water, such as clear juice or decaffeinated sports drinks, as tolerated or as told by your health care provider.  Avoid drinks with caffeine, such as coffee, tea, or soft drinks.  Avoid alcohol. What foods are recommended? The items listed may not be a complete list. Talk with your health care provider about what dietary choices are best for you. Grains White rice. White, [place], or pita breads (fresh or toasted), including plain rolls, buns, or bagels. White pasta. Saltine, soda, or graham crackers. Pretzels. Low-fiber cereal. Cooked cereals made with water (such as cornmeal, farina, or cream cereals). Plain muffins. Matzo. Melba toast. Zwieback. Vegetables Potatoes (without the skin). Most well-cooked and canned vegetables without skins or seeds. Tender lettuce. Fruits Apple sauce. Fruits canned in juice. Cooked apricots, cherries, grapefruit, peaches, pears, or plums. Fresh bananas and cantaloupe. Meats and other protein foods Baked or boiled chicken. Eggs. Tofu. Fish. Seafood. Smooth nut butters. Ground or well-cooked tender beef, ham, veal, lamb, pork, or poultry. Dairy Plain yogurt, kefir, and unsweetened liquid yogurt. Lactose-free milk, buttermilk, skim milk, or  soy milk. Low-fat or nonfat hard cheese. Beverages Water. Low-calorie sports drinks. Fruit juices without pulp. Strained tomato and vegetable juices. Decaffeinated teas. Sugar-free beverages not sweetened with sugar alcohols. Oral rehydration solutions, if approved by your health care  provider. Seasoning and other foods Bouillon, broth, or soups made from recommended foods. What foods are not recommended? The items listed may not be a complete list. Talk with your health care provider about what dietary choices are best for you. Grains Whole grain, whole wheat, bran, or rye breads, rolls, pastas, and crackers. Wild or brown rice. Whole grain or bran cereals. Barley. Oats and oatmeal. Corn tortillas or taco shells. Granola. Popcorn. Vegetables Raw vegetables. Fried vegetables. Cabbage, broccoli, Brussels sprouts, artichokes, baked beans, beet greens, corn, kale, legumes, peas, sweet potatoes, and yams. Potato skins. Cooked spinach and cabbage. Fruits Dried fruit, including raisins and dates. Raw fruits. Stewed or dried prunes. Canned fruits with syrup. Meat and other protein foods Fried or fatty meats. Deli meats. Chunky nut butters. Nuts and seeds. Beans and lentils. Joseph Sharp. Hot dogs. Sausage. Dairy High-fat cheeses. Whole milk, chocolate milk, and beverages made with milk, such as milk shakes. Half-and-half. Cream. sour cream. Ice cream. Beverages Caffeinated beverages (such as coffee, tea, soda, or energy drinks). Alcoholic beverages. Fruit juices with pulp. Prune juice. Soft drinks sweetened with high-fructose corn syrup or sugar alcohols. High-calorie sports drinks. Fats and oils Butter. Cream sauces. Margarine. Salad oils. Plain salad dressings. Olives. Avocados. Mayonnaise. Sweets and desserts Sweet rolls, doughnuts, and sweet breads. Sugar-free desserts sweetened with sugar alcohols such as xylitol and sorbitol. Seasoning and other foods Honey. Hot sauce. Chili powder. Gravy. Cream-based or milk-based soups. Pancakes and waffles. Summary  When you have diarrhea, the foods you eat and your eating habits are very important.  Make sure you get at least 8-10 cups of fluid each day, or enough to keep your urine clear or pale yellow.  Eat bland foods and gradually  reintroduce healthy, nutrient-rich foods as tolerated, or as told by your health care provider.  Avoid high-fiber, fried, greasy, or spicy foods. This information is not intended to replace advice given to you by your health care provider. Make sure you discuss any questions you have with your health care provider. Document Released: 01/20/2004 Document Revised: 10/27/2016 Document Reviewed: 10/27/2016 Elsevier Interactive Patient Education  2017 [employer].    IF you received an x-ray today, you will receive an invoice from [facility]. Please contact [facility] at [phone] with questions or concerns regarding your invoice.   IF you received labwork today, you will receive an invoice from [facility]. Please contact [facility] at [phone] with questions or concerns regarding your invoice.   Our billing staff will not be able to assist you with questions regarding bills from these companies.  You will be contacted with the lab results as soon as they are available. The fastest way to get your results is to activate your My Chart account. Instructions are located on the last page of this paperwork. If you have not heard from [place] regarding the results in 2 weeks, please contact this office.

## 2017-06-19 NOTE — Progress Notes (Signed)
PRIMARY CARE AT [facility] [address] [place] [address] [phone]  Date:  06/19/2017   Name:  Joseph Sharp   DOB:  16-Aug-1951   MRN:  [id number]  PCP:  Patient, No Pcp Per    History of Present Illness:  Joseph Sharp is a 66 y.o. male patient who presents to PCP with  Chief Complaint  Patient presents with  . Illness    per pt states feels weak, dizzness, diarrhea, "just overall doesn't feel good". Pt states this has be going on since yesterday and reports a fever of 101.2 about lunch time today.     Yesterday, started feeling malaise, weakness, and shaking.  He had diarrhea that started last night.  No vomiting or nausea.  No abdominal pain.  Fever today, was 101.8 3 hours ago.  2-3 episodes.  He has dizziness with walking around.  No blood or black stool.  Watery stool.  In the last 2 weeks, no shelters, nursing homes, or hospitals.  Last was [facility]  Recent travel to [place] 2 days ago in [place], and small town.  No swimming activity.    He has eaten egg and orange juice.    He sees dr. Dwyane Dee Patient Active Problem List   Diagnosis Date Noted  . ED (erectile dysfunction) of organic origin 09/04/2014  . Allergic rhinitis 08/10/2014  . Diabetes mellitus, stable ([place]) 06/17/2013  . Pure hypercholesterolemia 06/17/2013    Past Medical History:  Diagnosis Date  . Allergy   . Diabetes mellitus without complication [facility])     Past Surgical History:  Procedure Laterality Date  . EYE SURGERY  01/2013   Eyelid surgery    Social History  Substance Use Topics  . Smoking status: Never Smoker  . Smokeless tobacco: Never Used  . Alcohol use No    Family History  Problem Relation Age of Onset  . Diabetes Mother   . Arthritis Mother   . Diabetes Brother   . Cancer Neg Hx   . Heart disease Neg Hx     No Known Allergies  Medication list has been reviewed and updated.  Current Outpatient Prescriptions on File Prior to Visit   Medication Sig Dispense Refill  . aspirin 81 MG tablet Take 81 mg by mouth daily.    . cholecalciferol (VITAMIN D) 1000 UNITS tablet Take 1,000 Units by mouth daily.    Marland Kitchen EPIPEN 2-PAK 0.3 MG/0.3ML SOAJ injection     . fluticasone (FLONASE) 50 MCG/ACT nasal spray     . glipiZIDE (GLUCOTROL XL) 2.5 MG 24 hr tablet Take 1 tablet (2.5 mg total) by mouth daily with breakfast. 90 tablet 2  . lovastatin (MEVACOR) 20 MG tablet TAKE 1 TABLET BY MOUTH EVERY DAY 90 tablet 0  . metFORMIN (GLUCOPHAGE-XR) 500 MG 24 hr tablet TAKE 4 TABLETS BY MOUTH EVERY DAY 360 tablet 1  . pioglitazone (ACTOS) 30 MG tablet Take 1 tablet (30 mg total) by mouth daily. 90 tablet 1  . ramipril (ALTACE) 2.5 MG capsule TAKE ONE CAPSULE BY MOUTH EVERY DAY 30 capsule 1  . tadalafil (CIALIS) 20 MG tablet Take 1 tablet (20 mg total) by mouth daily as needed for erectile dysfunction. 5 tablet 3  . TRULICITY 0.93 OI/7.1IW SOPN INJECT INTO THE SKIN ONCE A WEEK 2 pen 5  . Azelastine HCl 0.15 % SOLN     . FREESTYLE LITE test strip USE AS INSTRUCTED TO CHECK BLOOD SUGAR ONCE A DAY (Patient not taking:  Reported on 04/06/2017) 50 each 5  . nortriptyline (PAMELOR) 10 MG capsule Take 1 capsule (10 mg total) by mouth at bedtime. (Patient not taking: Reported on 06/19/2017) 30 capsule 2   No current facility-administered medications on file prior to visit.     ROS ROS otherwise unremarkable unless listed above.  Physical Examination: BP 131/69 (BP Location: Right Arm, Patient Position: Sitting, Cuff Size: Normal)   Pulse 96   Temp 100.3 F (37.9 C) (Oral)   Resp 18   Ht 5\' 10"  (1.778 m)   Wt 181 lb 12.8 oz (82.5 kg)   SpO2 97%   BMI 26.09 kg/m  Ideal Body Weight: Weight in (lb) to have BMI = 25: 173.9  Physical Exam  Constitutional: He is oriented to person, place, and time. He appears well-developed and well-nourished. No distress.  HENT:  Head: Normocephalic and atraumatic.  Eyes: Pupils are equal, round, and reactive to light.  Conjunctivae and EOM are normal.  Cardiovascular: Normal rate.   Pulmonary/Chest: Effort normal. No respiratory distress.  Abdominal: Soft. Normal appearance and bowel sounds are normal. There is no tenderness.  Neurological: He is alert and oriented to person, place, and time.  Skin: Skin is warm and dry. He is not diaphoretic.  Psychiatric: He has a normal mood and affect. His behavior is normal.   Results for orders placed or performed in visit on 06/19/17  POCT CBC  Result Value Ref Range   WBC 13.2 (A) 4.6 - 10.2 K/uL   Lymph, poc 1.4 0.6 - 3.4   POC LYMPH PERCENT 10.3 10 - 50 %L   MID (cbc) 0.4 0 - 0.9   POC MID % 3.1 0 - 12 %M   POC Granulocyte 11.4 (A) 2 - 6.9   Granulocyte percent 86.6 (A) 37 - 80 %G   RBC 4.87 4.69 - 6.13 M/uL   Hemoglobin 13.9 (A) 14.1 - 18.1 g/dL   HCT, POC 41.2 (A) 43.5 - 53.7 %   MCV 84.6 80 - 97 fL   MCH, POC 28.6 27 - 31.2 pg   MCHC 33.8 31.8 - 35.4 g/dL   RDW, POC 14.2 %   Platelet Count, POC 233 142 - 424 K/uL   MPV 7.8 0 - 99.8 fL  POCT glucose (manual entry)  Result Value Ref Range   POC Glucose 157 (A) 70 - 99 mg/dl  POCT urinalysis dipstick  Result Value Ref Range   Color, UA yellow yellow   Clarity, UA clear clear   Glucose, UA negative negative mg/dL   Bilirubin, UA negative negative   Ketones, POC UA trace (5) (A) negative mg/dL   Spec Grav, UA 1.025 1.010 - 1.025   Blood, UA negative negative   pH, UA 5.5 5.0 - 8.0   Protein Ur, POC =30 (A) negative mg/dL   Urobilinogen, UA 0.2 0.2 or 1.0 E.U./dL   Nitrite, UA Negative Negative   Leukocytes, UA Negative Negative   Orthostatic VS for the past 24 hrs (Last 3 readings):  BP- Lying Pulse- Lying BP- Sitting Pulse- Sitting BP- Standing at 0 minutes Pulse- Standing at 0 minutes  06/19/17 1552 119/73 73 115/73 89 108/71 89    Assessment and Plan: EDYN QAZI is a 66 y.o. male who is here today for cc of diarrhea, and fever. Will treat for traveller's covering for  bacterial etiology Advised to return cultures tomorrow prior to abx use.  Return in about 3 days (around 06/22/2017), or if symptoms worsen or  fail to improve.  Fever, unspecified fever cause - Plan: POCT CBC, POCT glucose (manual entry), acetaminophen (TYLENOL) tablet 500 mg, POCT urinalysis dipstick  Diarrhea, unspecified type  Ivar Drape, PA-C [facility] 8/7/20184:00 PM

## 2017-06-21 LAB — GI PROFILE, STOOL, PCR
ADENOVIRUS F 40/41: NOT DETECTED
ASTROVIRUS: NOT DETECTED
C DIFFICILE TOXIN A/B: NOT DETECTED
CAMPYLOBACTER: DETECTED — AB
Cryptosporidium: NOT DETECTED
Cyclospora cayetanensis: NOT DETECTED
ENTEROAGGREGATIVE E COLI: NOT DETECTED
ENTEROPATHOGENIC E COLI: DETECTED — AB
ENTEROTOXIGENIC E COLI: NOT DETECTED
Entamoeba histolytica: NOT DETECTED
GIARDIA LAMBLIA: NOT DETECTED
NOROVIRUS GI/GII: NOT DETECTED
Plesiomonas shigelloides: NOT DETECTED
Rotavirus A: NOT DETECTED
SHIGELLA/ENTEROINVASIVE E COLI: NOT DETECTED
Salmonella: NOT DETECTED
Sapovirus: NOT DETECTED
Shiga-toxin-producing E coli: NOT DETECTED
VIBRIO CHOLERAE: NOT DETECTED
Vibrio: NOT DETECTED
YERSINIA ENTEROCOLITICA: NOT DETECTED

## 2017-07-03 LAB — OVA AND PARASITE EXAMINATION

## 2017-07-04 ENCOUNTER — Other Ambulatory Visit: Payer: Self-pay | Admitting: Endocrinology

## 2017-07-10 ENCOUNTER — Other Ambulatory Visit (INDEPENDENT_AMBULATORY_CARE_PROVIDER_SITE_OTHER): Payer: 59

## 2017-07-10 DIAGNOSIS — E1165 Type 2 diabetes mellitus with hyperglycemia: Secondary | ICD-10-CM | POA: Diagnosis not present

## 2017-07-10 DIAGNOSIS — R7989 Other specified abnormal findings of blood chemistry: Secondary | ICD-10-CM

## 2017-07-10 LAB — COMPREHENSIVE METABOLIC PANEL
ALT: 11 U/L (ref 0–53)
AST: 12 U/L (ref 0–37)
Albumin: 4.2 g/dL (ref 3.5–5.2)
Alkaline Phosphatase: 58 U/L (ref 39–117)
BILIRUBIN TOTAL: 0.5 mg/dL (ref 0.2–1.2)
BUN: 19 mg/dL (ref 6–23)
CALCIUM: 9.9 mg/dL (ref 8.4–10.5)
CHLORIDE: 107 meq/L (ref 96–112)
CO2: 27 meq/L (ref 19–32)
Creatinine, Ser: 1 mg/dL (ref 0.40–1.50)
GFR: 79.84 mL/min (ref >60.00)
Glucose, Bld: 133 mg/dL — ABNORMAL HIGH (ref 70–99)
Potassium: 4.4 mEq/L (ref 3.5–5.1)
Sodium: 142 mEq/L (ref 135–145)
Total Protein: 6.8 g/dL (ref 6.0–8.3)

## 2017-07-10 LAB — HEMOGLOBIN A1C: Hgb A1c MFr Bld: 6.9 % — ABNORMAL HIGH (ref 4.6–6.5)

## 2017-07-10 LAB — HM DIABETES EYE EXAM

## 2017-07-12 LAB — TESTOSTERONE, FREE, TOTAL, SHBG
Sex Hormone Binding: 37.9 nmol/L (ref 19.3–76.4)
TESTOSTERONE: 354 ng/dL (ref 264–916)
Testosterone, Free: 8.9 pg/mL (ref 6.6–18.1)

## 2017-07-12 NOTE — Progress Notes (Signed)
Patient ID: Joseph Sharp, male   DOB: [birth year], 66 y.o.   MRN: [id number]   Reason for Appointment: follow-up for diabetes and blood pressure   History of Present Illness    Diagnosis: Type 2 DIABETES MELITUS, diagnosed in 1977     PAST history: He has had long-standing diabetes managed with multiple drugs and usually well controlled His blood sugar control is somewhat dependent on his compliance with diet and exercise regimen  In 11 /14 his blood sugars were higher overall and A1c was 7% which was higher than usual Because of this he was started on Trulicity injections, 7.67 mg He did benefit overall from starting 3.41 mg Trulicity  RECENT history:  His A1c is still relatively high at 6.9 although last time was 7.1, has been as low as 6.6  Non-insulin hypoglycemic drugs: Glipizide ER 2.5 mg daily , metformin 2g, pioglitazone 30 mg, Trulicity 9.37 mg weekly  Current management, blood sugar patterns and problems identified:  He is checking his blood sugars with a FreeStyle meter again recently  No medication changes were made on the last visit but he was told to try and prove his diet and start exercise  He still has not made any changes and eating out frequently in the evening  He has not been trying to find time for walking or exercise  Even though he is eating mostly an egg and toast at breakfast as blood sugars tend to be high after eating  Has not checked after dinner  Side effects from medications: None  Monitors blood glucose:  1+ times a day     Glucometer:  FreeStyle         Blood Glucose readings recently from monitor download:   Blood sugar range 10 7-247, average 133  HIGHEST sugars are after breakfast: 186, 247 Fasting 108-117 bedtime 107-129   No hypoglycemia          Diet: Usually controlled with smaller portions. Has a egg and toast at breakfast with small amount of juice and fruit, usually getting low fat meals, not eating at  home  Dinner usually at 7 pm  EXERCISE: Minimal, previously walking 30 min at least 2-3 times a week    Wt Readings from Last 3 Encounters:  07/13/17 177 lb (80.3 kg)  06/19/17 181 lb 12.8 oz (82.5 kg)  04/06/17 182 lb 12.8 oz (82.9 kg)   Lab Results  Component Value Date   HGBA1C 6.9 (H) 07/10/2017   HGBA1C 7.1 (H) 04/03/2017   HGBA1C 6.9 (H) 12/05/2016   Lab Results  Component Value Date   MICROALBUR 0.7 04/03/2017   LDLCALC 63 12/05/2016   CREATININE 1.00 07/10/2017     Allergies as of 07/13/2017   No Known Allergies     Medication List       Accurate as of 07/13/17  8:32 AM. Always use your most recent med list.          aspirin 81 MG tablet Take 81 mg by mouth daily.   Azelastine HCl 0.15 % Soln   cholecalciferol 1000 units tablet Commonly known as:  VITAMIN D Take 1,000 Units by mouth daily.   EPIPEN 2-PAK 0.3 mg/0.3 mL Soaj injection Generic drug:  EPINEPHrine   fluticasone 50 MCG/ACT nasal spray Commonly known as:  FLONASE   FREESTYLE LITE test strip Generic drug:  glucose blood USE AS INSTRUCTED TO CHECK BLOOD SUGAR ONCE A DAY   glipiZIDE 2.5 MG 24 hr tablet Commonly  known as:  GLUCOTROL XL Take 1 tablet (2.5 mg total) by mouth daily with breakfast.   lovastatin 20 MG tablet Commonly known as:  MEVACOR TAKE 1 TABLET BY MOUTH EVERY DAY   metFORMIN 500 MG 24 hr tablet Commonly known as:  GLUCOPHAGE-XR TAKE 4 TABLETS BY MOUTH EVERY DAY   nortriptyline 10 MG capsule Commonly known as:  PAMELOR Take 1 capsule (10 mg total) by mouth at bedtime.   pioglitazone 30 MG tablet Commonly known as:  ACTOS Take 1 tablet (30 mg total) by mouth daily.   ramipril 2.5 MG capsule Commonly known as:  ALTACE TAKE ONE CAPSULE BY MOUTH EVERY DAY   tadalafil 20 MG tablet Commonly known as:  CIALIS Take 1 tablet (20 mg total) by mouth daily as needed for erectile dysfunction.   TRULICITY 4.00 QQ/7.6PP Sopn Generic drug:  Dulaglutide INJECT INTO THE  SKIN ONCE A WEEK       Allergies: No Known Allergies  Past Medical History:  Diagnosis Date  . Allergy   . Diabetes mellitus without complication [facility])     Past Surgical History:  Procedure Laterality Date  . EYE SURGERY  01/2013   Eyelid surgery    Family History  Problem Relation Age of Onset  . Diabetes Mother   . Arthritis Mother   . Diabetes Brother   . Cancer Neg Hx   . Heart disease Neg Hx     Social History:  reports that he has never smoked. He has never used smokeless tobacco. He reports that he does not drink alcohol or use drugs.   Review of Systems   He is complaining of fatigue  Does tend to have late insomnia But this was not better with trial of nortriptyline  HYPERLIPIDEMIA: The lipid abnormality consists of elevated LDL and triglycerides and low HDL, well controlled with lovastatin Has low HDL from metabolic syndrome   Lab Results  Component Value Date   CHOL 113 12/05/2016   HDL 35.00 (L) 12/05/2016   LDLCALC 63 12/05/2016   TRIG 78.0 12/05/2016   CHOLHDL 3 12/05/2016    He previously had transient hypogonadotropic hypogonadism which subsequently had resolved.  Was previously treated with AndroGel.  Since he has been having more fatigued the last few months testosterone has been checked again but this is quite normal    Lab Results  Component Value Date   TESTOSTERONE 354 07/10/2017    Last foot exam in 6/17 was normal.  He takes vitamin D 1000 units as a supplement  Lab Results  Component Value Date   VD25OH 46.46 04/03/2017          Examination:   BP 114/70   Pulse 71   Ht 5\' 10"  (1.778 m)   Wt 177 lb (80.3 kg)   SpO2 96%   BMI 25.40 kg/m   Body mass index is 25.4 kg/m.       Physical Exam   ASSESSMENT/ PLAN:    DIABETES:  See history of present illness for detailed discussion of current diabetes management, blood sugar patterns and problems identified  His glucose is looking fairly good at home except after  breakfast He may be having high readings after evening meal when he is eating out frequently Also not exercising   Recommendations: He will increase Trulicity to the 1.5 mg dose starting tomorrow If he has no nausea and blood sugars are better he will call for the 1.5 mg weekly prescription More readings 2 hours after dinner  Start walking for exercise A1c in 3 months again  There are no Patient Instructions on file for this visit.   Keiarra Charon 07/13/2017, 8:32 AM   Note: This office note was prepared with Dragon voice recognition system technology. Any transcriptional errors that result from this process are unintentional.

## 2017-07-13 ENCOUNTER — Encounter: Payer: Self-pay | Admitting: Endocrinology

## 2017-07-13 ENCOUNTER — Ambulatory Visit (INDEPENDENT_AMBULATORY_CARE_PROVIDER_SITE_OTHER): Payer: 59 | Admitting: Endocrinology

## 2017-07-13 VITALS — BP 114/70 | HR 71 | Ht 70.0 in | Wt 177.0 lb

## 2017-07-13 DIAGNOSIS — E1165 Type 2 diabetes mellitus with hyperglycemia: Secondary | ICD-10-CM | POA: Diagnosis not present

## 2017-07-13 NOTE — Patient Instructions (Addendum)
Start walking daily   More sugars after dinner  Take 2 of 0.75 shots and call for 1.5 dose next

## 2017-07-25 ENCOUNTER — Other Ambulatory Visit: Payer: Self-pay

## 2017-07-25 ENCOUNTER — Telehealth: Payer: Self-pay | Admitting: Endocrinology

## 2017-07-25 MED ORDER — DULAGLUTIDE 0.75 MG/0.5ML ~~LOC~~ SOAJ: SUBCUTANEOUS | 5 refills | Status: DC

## 2017-07-25 NOTE — Telephone Encounter (Signed)
MEDICATION: Trulicity  PHARMACY: [facility] [address]   IS THIS A 90 DAY SUPPLY : yes  IS PATIENT OUT OF MEDICATION: Trulicity  IF NOT; HOW MUCH IS LEFT:   LAST APPOINTMENT DATE:  08/31  NEXT APPOINTMENT DATE: 11/30  OTHER COMMENTS:    **Let patient know to contact pharmacy at the end of the day to make sure medication is ready. **  ** Please notify patient to allow 48-72 hours to process**  **Encourage patient to contact the pharmacy for refills or they can request refills through [facility]**

## 2017-07-25 NOTE — Telephone Encounter (Signed)
Called patient and let him know that I have sent over [facility] to [facility] on [address].

## 2017-07-28 ENCOUNTER — Other Ambulatory Visit: Payer: Self-pay | Admitting: Endocrinology

## 2017-07-31 NOTE — Telephone Encounter (Signed)
Pt is in need of the trulicity 1.5 to be called in please as per the last note  [facility] [address]

## 2017-08-03 ENCOUNTER — Other Ambulatory Visit: Payer: Self-pay

## 2017-08-03 MED ORDER — DULAGLUTIDE 1.5 MG/0.5ML ~~LOC~~ SOAJ: SUBCUTANEOUS | 3 refills | Status: DC

## 2017-08-03 NOTE — Telephone Encounter (Signed)
Called patient and let him know that I am sending in the 1.5 Trulicity instead of the V4.71 Trulicity that was originally [facility] [address].

## 2017-08-06 ENCOUNTER — Other Ambulatory Visit: Payer: Self-pay

## 2017-08-06 MED ORDER — TADALAFIL 20 MG PO TABS: 20.0000 mg | ORAL_TABLET | Freq: Every day | ORAL | 3 refills | Status: DC | PRN

## 2017-09-21 ENCOUNTER — Other Ambulatory Visit: Payer: Self-pay

## 2017-09-21 MED ORDER — DULAGLUTIDE 1.5 MG/0.5ML ~~LOC~~ SOAJ: SUBCUTANEOUS | 2 refills | Status: DC

## 2017-09-26 ENCOUNTER — Other Ambulatory Visit: Payer: Self-pay

## 2017-09-26 MED ORDER — DULAGLUTIDE 1.5 MG/0.5ML ~~LOC~~ SOAJ: SUBCUTANEOUS | 2 refills | Status: DC

## 2017-09-27 ENCOUNTER — Other Ambulatory Visit: Payer: Self-pay

## 2017-09-27 MED ORDER — DULAGLUTIDE 1.5 MG/0.5ML ~~LOC~~ SOAJ: SUBCUTANEOUS | 2 refills | Status: DC

## 2017-10-02 ENCOUNTER — Other Ambulatory Visit: Payer: Self-pay | Admitting: Endocrinology

## 2017-10-09 ENCOUNTER — Other Ambulatory Visit: Payer: 59

## 2017-10-10 ENCOUNTER — Other Ambulatory Visit (INDEPENDENT_AMBULATORY_CARE_PROVIDER_SITE_OTHER): Payer: 59

## 2017-10-10 ENCOUNTER — Other Ambulatory Visit: Payer: Self-pay | Admitting: Endocrinology

## 2017-10-10 DIAGNOSIS — E1165 Type 2 diabetes mellitus with hyperglycemia: Secondary | ICD-10-CM | POA: Diagnosis not present

## 2017-10-10 LAB — COMPREHENSIVE METABOLIC PANEL
ALK PHOS: 62 U/L (ref 39–117)
ALT: 14 U/L (ref 0–53)
AST: 14 U/L (ref 0–37)
Albumin: 3.9 g/dL (ref 3.5–5.2)
BILIRUBIN TOTAL: 0.5 mg/dL (ref 0.2–1.2)
BUN: 20 mg/dL (ref 6–23)
CO2: 28 meq/L (ref 19–32)
CREATININE: 1.17 mg/dL (ref 0.40–1.50)
Calcium: 10.1 mg/dL (ref 8.4–10.5)
Chloride: 108 mEq/L (ref 96–112)
GFR: 66.56 mL/min (ref >60.00)
GLUCOSE: 196 mg/dL — AB (ref 70–99)
Potassium: 4.7 mEq/L (ref 3.5–5.1)
Sodium: 142 mEq/L (ref 135–145)
TOTAL PROTEIN: 6.7 g/dL (ref 6.0–8.3)

## 2017-10-10 LAB — LIPID PANEL
CHOL/HDL RATIO: 3
Cholesterol: 117 mg/dL (ref 0–200)
HDL: 36.3 mg/dL — ABNORMAL LOW (ref >39.00)
LDL Cholesterol: 58 mg/dL (ref 0–99)
NONHDL: 81.02
TRIGLYCERIDES: 113 mg/dL (ref 0.0–149.0)
VLDL: 22.6 mg/dL (ref 0.0–40.0)

## 2017-10-10 LAB — HEMOGLOBIN A1C: Hgb A1c MFr Bld: 6.6 % — ABNORMAL HIGH (ref 4.6–6.5)

## 2017-10-12 ENCOUNTER — Ambulatory Visit: Payer: 59 | Admitting: Endocrinology

## 2017-10-12 ENCOUNTER — Encounter: Payer: Self-pay | Admitting: Endocrinology

## 2017-10-12 VITALS — BP 108/66 | HR 82 | Ht 70.0 in | Wt 180.0 lb

## 2017-10-12 DIAGNOSIS — E1165 Type 2 diabetes mellitus with hyperglycemia: Secondary | ICD-10-CM | POA: Diagnosis not present

## 2017-10-12 DIAGNOSIS — Z23 Encounter for immunization: Secondary | ICD-10-CM | POA: Diagnosis not present

## 2017-10-12 MED ORDER — SEMAGLUTIDE(0.25 OR 0.5MG/DOS) 2 MG/1.5ML ~~LOC~~ SOPN: 0.5000 mg | PEN_INJECTOR | SUBCUTANEOUS | 2 refills | Status: DC

## 2017-10-12 NOTE — Progress Notes (Signed)
Patient ID: Joseph Sharp, male   DOB: October 24, 1951, 66 y.o.   MRN: [id number]   Reason for Appointment: follow-up for diabetes and blood pressure   History of Present Illness    Diagnosis: Type 2 DIABETES MELITUS, diagnosed in 1977     PAST history: He has had long-standing diabetes managed with multiple drugs and usually well controlled His blood sugar control is somewhat dependent on his compliance with diet and exercise regimen  In 11 /14 his blood sugars were higher overall and A1c was 7% which was higher than usual Because of this he was started on Trulicity injections, 9.62 mg He did benefit overall from starting 2.29 mg Trulicity  RECENT history:  His A1c is slightly better at 6.6, has been as high as 7.1 before  Non-insulin hypoglycemic drugs: Glipizide ER 2.5 mg daily , metformin 2g, pioglitazone 30 mg, Trulicity 1.5 mg weekly  Current management, blood sugar patterns and problems identified:  He is tolerating the 1.5 mg Trulicity, the dose was increased on the last visit  However his weight has gone up and this is likely from him not watching his diet or exercise  He says he is eating out regularly in the evening although tried to make better choices  Fasting blood sugars recently are mildly increased overall  He has not been trying to find time for walking or exercise  Even though he is eating mostly an egg and toast at breakfast his postprandial readings after breakfast are mostly high  Side effects from medications: None  Monitors blood glucose:  1+ times a day     Glucometer:  FreeStyle         Blood Glucose readings recently from monitor download:   Blood sugar AVERAGE 159 Fasting range 116-146 in nonfasting 149-201  No hypoglycemia          Diet:  Has a egg and toast at breakfast with small amount of juice and fruit, usually getting low fat meals, not eating at home in the evenings  Dinner usually at 7 pm  EXERCISE: Minimal    Wt  Readings from Last 3 Encounters:  10/12/17 180 lb (81.6 kg)  07/13/17 177 lb (80.3 kg)  06/19/17 181 lb 12.8 oz (82.5 kg)   Lab Results  Component Value Date   HGBA1C 6.6 (H) 10/10/2017   HGBA1C 6.9 (H) 07/10/2017   HGBA1C 7.1 (H) 04/03/2017   Lab Results  Component Value Date   MICROALBUR 0.7 04/03/2017   LDLCALC 58 10/10/2017   CREATININE 1.17 10/10/2017     Allergies as of 10/12/2017   No Known Allergies     Medication List        Accurate as of 10/12/17 11:30 AM. Always use your most recent med list.          aspirin 81 MG tablet Take 81 mg by mouth daily.   Azelastine HCl 0.15 % Soln   cholecalciferol 1000 units tablet Commonly known as:  VITAMIN D Take 1,000 Units by mouth daily.   Dulaglutide 1.5 MG/0.5ML Sopn Commonly known as:  TRULICITY INJECT INTO THE SKIN ONCE A WEEK   EPIPEN 2-PAK 0.3 mg/0.3 mL Soaj injection Generic drug:  EPINEPHrine   fluticasone 50 MCG/ACT nasal spray Commonly known as:  FLONASE   FREESTYLE LITE test strip Generic drug:  glucose blood USE AS INSTRUCTED TO CHECK BLOOD SUGAR ONCE A DAY   glipiZIDE 2.5 MG 24 hr tablet Commonly known as:  GLUCOTROL XL Take 1  tablet (2.5 mg total) by mouth daily with breakfast.   lovastatin 20 MG tablet Commonly known as:  MEVACOR TAKE 1 TABLET BY MOUTH EVERY DAY   metFORMIN 500 MG 24 hr tablet Commonly known as:  GLUCOPHAGE-XR TAKE 4 TABLETS BY MOUTH EVERY DAY   pioglitazone 30 MG tablet Commonly known as:  ACTOS Take 1 tablet (30 mg total) by mouth daily.   ramipril 2.5 MG capsule Commonly known as:  ALTACE TAKE ONE CAPSULE BY MOUTH EVERY DAY   Semaglutide 0.25 or 0.5 MG/DOSE Sopn Commonly known as:  OZEMPIC Inject 0.5 mg into the skin once a week.   tadalafil 20 MG tablet Commonly known as:  CIALIS Take 1 tablet (20 mg total) by mouth daily as needed for erectile dysfunction.       Allergies: No Known Allergies  Past Medical History:  Diagnosis Date  . Allergy     . Diabetes mellitus without complication [facility])     Past Surgical History:  Procedure Laterality Date  . EYE SURGERY  01/2013   Eyelid surgery    Family History  Problem Relation Age of Onset  . Diabetes Mother   . Arthritis Mother   . Diabetes Brother   . Cancer Neg Hx   . Heart disease Neg Hx     Social History:  reports that  has never smoked. he has never used smokeless tobacco. He reports that he does not drink alcohol or use drugs.   Review of Systems    HYPERTENSION: This has been very mild and he is on 2.5 mg ramipril without side effects  BP Readings from Last 3 Encounters:  10/12/17 108/66  07/13/17 114/70  06/19/17 131/69     HYPERLIPIDEMIA: The lipid abnormality consists of elevated LDL and triglycerides and low HDL, well controlled with lovastatin Has low HDL from metabolic syndrome   Lab Results  Component Value Date   CHOL 117 10/10/2017   HDL 36.30 (L) 10/10/2017   LDLCALC 58 10/10/2017   TRIG 113.0 10/10/2017   CHOLHDL 3 10/10/2017    He previously had transient hypogonadotropic hypogonadism which subsequently had resolved.  Was previously treated with AndroGel. He has had mild chronic nonspecific fatigue   Lab Results  Component Value Date   TESTOSTERONE 354 07/10/2017    Last foot exam in 6/17 was normal.  He takes vitamin D 1000 units as a supplement  Lab Results  Component Value Date   VD25OH 46.46 04/03/2017          Examination:   BP 108/66   Pulse 82   Ht 5\' 10"  (1.778 m)   Wt 180 lb (81.6 kg)   SpO2 97%   BMI 25.83 kg/m   Body mass index is 25.83 kg/m.       Physical Exam   ASSESSMENT/ PLAN:    DIABETES:  See history of present illness for detailed discussion of current diabetes management, blood sugar patterns and problems identified  His glucose control is better with A1c 6.6 However he has not been benefiting from increased Trulicity as far as weight loss This is likely related to his eating out  frequently in the evenings and not exercising Still tends to have relatively high postprandial readings but has not monitored his blood sugars consistently   Recommendations: He will start checking his blood sugars more consistently and this may help him making good choices at meals especially when eating out No change in medications at this time Start walking for exercise, he  can start going to the gym A1c in 3 months again  LIPIDS: LDL is well controlled but HDL still relatively low  Patient Instructions  Gym after work daily  Low fat choices     Influenza vaccine given   [facility] 10/12/2017, 11:30 AM   Note: This office note was prepared with Estate agent. Any transcriptional errors that result from this process are unintentional.

## 2017-10-12 NOTE — Patient Instructions (Addendum)
Gym after work daily  Low fat choices

## 2017-10-16 ENCOUNTER — Other Ambulatory Visit: Payer: Self-pay | Admitting: Endocrinology

## 2017-11-29 ENCOUNTER — Ambulatory Visit: Payer: 59 | Admitting: Urgent Care

## 2017-11-29 ENCOUNTER — Encounter: Payer: Self-pay | Admitting: Urgent Care

## 2017-11-29 VITALS — BP 134/80 | HR 90 | Temp 98.0°F | Resp 18 | Ht 70.0 in | Wt 179.8 lb

## 2017-11-29 DIAGNOSIS — R195 Other fecal abnormalities: Secondary | ICD-10-CM | POA: Diagnosis not present

## 2017-11-29 DIAGNOSIS — R142 Eructation: Secondary | ICD-10-CM | POA: Diagnosis not present

## 2017-11-29 DIAGNOSIS — E119 Type 2 diabetes mellitus without complications: Secondary | ICD-10-CM | POA: Diagnosis not present

## 2017-11-29 MED ORDER — RANITIDINE HCL 150 MG PO TABS: 150.0000 mg | ORAL_TABLET | Freq: Two times a day (BID) | ORAL | 0 refills | Status: DC

## 2017-11-29 NOTE — Patient Instructions (Addendum)
Try to hydrate with just water. We will use Zantac to address reflux as a source of your burping. I will let you know about your lab results of the stool culture to see if you have any infection causing the loose stools. In the meantime, avoid starting any new medications, eating new foods. Come back to the clinic if you experience fever, nausea, vomiting, bloody stools, diarrhea.      IF you received an x-ray today, you will receive an invoice from [facility]. Please contact [facility] at [phone] with questions or concerns regarding your invoice.   IF you received labwork today, you will receive an invoice from [facility]. Please contact [facility] at [phone] with questions or concerns regarding your invoice.   Our billing staff will not be able to assist you with questions regarding bills from these companies.  You will be contacted with the lab results as soon as they are available. The fastest way to get your results is to activate your My Chart account. Instructions are located on the last page of this paperwork. If you have not heard from [place] regarding the results in 2 weeks, please contact this office.

## 2017-11-29 NOTE — Progress Notes (Signed)
   MRN: [id number] DOB: December 06, 1950  Subjective:   Joseph Sharp is a 67 y.o. male presenting for ~1 month history of burping, 2 month history of loose stools. Has ~2 bowel movements per day. Has tried [employer] with some relief of burping, Tums without any significant changes. Has had loose stools for the past 2 months. Denies fever, n/v, abdominal pain, bloody stools, sour brash, cough. Had started semaglutide ~1 month ago but loose stools started after that.   Joseph Sharp has a current medication list which includes the following prescription(s): aspirin, azelastine hcl, cholecalciferol, epipen 2-pak, fluticasone, freestyle lite, glipizide, lovastatin, metformin, pioglitazone, ramipril, semaglutide, and tadalafil. Also has No Known Allergies.  Joseph Sharp  has a past medical history of Allergy and Diabetes mellitus without complication ([place]). Also  has a past surgical history that includes Eye surgery (01/2013).  Objective:   Vitals: BP 134/80   Pulse 90   Temp 98 F (36.7 C) (Oral)   Resp 18   Ht 5\' 10"  (1.778 m)   Wt 179 lb 12.8 oz (81.6 kg)   SpO2 97%   BMI 25.80 kg/m   Physical Exam  Constitutional: He is oriented to person, place, and time. He appears well-developed and well-nourished.  HENT:  Mouth/Throat: Oropharynx is clear and moist.  Eyes: No scleral icterus.  Cardiovascular: Normal rate, regular rhythm and intact distal pulses. Exam reveals no gallop and no friction rub.  No murmur heard. Pulmonary/Chest: No respiratory distress. He has no wheezes. He has no rales.  Abdominal: Soft. Bowel sounds are normal. He exhibits no distension and no mass. There is no tenderness. There is no guarding.  Neurological: He is alert and oriented to person, place, and time.  Skin: Skin is warm and dry.  Psychiatric: He has a normal mood and affect.    Assessment and Plan :   Burping - Plan: Comprehensive metabolic panel, Stool culture, H. pylori breath test  Loose stools - Plan:  Stool culture  Diabetes mellitus, stable ([place])  Labs pending, start Zantac to address GERD. Stool culture pending, consider medication side effects versus infectious source. Return-to-clinic precautions discussed, patient verbalized understanding.   Jaynee Eagles, PA-C [facility] [phone] 11/29/2017  4:40 PM

## 2017-11-30 LAB — COMPREHENSIVE METABOLIC PANEL
ALBUMIN: 4.5 g/dL (ref 3.6–4.8)
ALK PHOS: 66 IU/L (ref 39–117)
ALT: 16 IU/L (ref 0–44)
AST: 15 IU/L (ref 0–40)
Albumin/Globulin Ratio: 1.8 (ref 1.2–2.2)
BUN / CREAT RATIO: 18 (ref 10–24)
BUN: 19 mg/dL (ref 8–27)
Bilirubin Total: 0.2 mg/dL (ref 0.0–1.2)
CO2: 22 mmol/L (ref 20–29)
CREATININE: 1.03 mg/dL (ref 0.76–1.27)
Calcium: 10.5 mg/dL — ABNORMAL HIGH (ref 8.6–10.2)
Chloride: 105 mmol/L (ref 96–106)
GFR calc Af Amer: 88 mL/min/{1.73_m2} (ref >59)
GFR calc non Af Amer: 76 mL/min/{1.73_m2} (ref >59)
GLUCOSE: 92 mg/dL (ref 65–99)
Globulin, Total: 2.5 g/dL (ref 1.5–4.5)
Potassium: 4.9 mmol/L (ref 3.5–5.2)
Sodium: 144 mmol/L (ref 134–144)
TOTAL PROTEIN: 7 g/dL (ref 6.0–8.5)

## 2017-12-01 LAB — H. PYLORI BREATH TEST: H pylori Breath Test: POSITIVE — AB

## 2017-12-03 ENCOUNTER — Telehealth: Payer: Self-pay | Admitting: Urgent Care

## 2017-12-03 ENCOUNTER — Other Ambulatory Visit: Payer: Self-pay | Admitting: Urgent Care

## 2017-12-03 MED ORDER — AMOXICILLIN 500 MG PO CAPS: 1000.0000 mg | ORAL_CAPSULE | Freq: Two times a day (BID) | ORAL | 0 refills | Status: DC

## 2017-12-03 MED ORDER — CLARITHROMYCIN 500 MG PO TABS: 500.0000 mg | ORAL_TABLET | Freq: Two times a day (BID) | ORAL | 0 refills | Status: DC

## 2017-12-03 MED ORDER — OMEPRAZOLE 20 MG PO CPDR: 20.0000 mg | DELAYED_RELEASE_CAPSULE | Freq: Two times a day (BID) | ORAL | 1 refills | Status: DC

## 2017-12-03 NOTE — Telephone Encounter (Signed)
Jaynee Eagles, PA-CPhysician Assistant CertifiedSigned 54:98 AM           RN Rudene Christians, can you please let [name] know that he has to hold off on his cholesterol medications while taking clarithromycin. Thank you!

## 2017-12-03 NOTE — Progress Notes (Signed)
RN Rudene Christians, can you please let [name] know that he has to hold off on his cholesterol medications while taking clarithromycin. Thank you!

## 2017-12-03 NOTE — Telephone Encounter (Signed)
Per verbal conversation with provider, patient is discontinue zantac and take omeprazole instead.   Phone call to patient. He is agreeable, verbalizes understanding. Closing note.

## 2017-12-03 NOTE — Telephone Encounter (Signed)
Phone call to patient. Advised of message from provider. He wants to know if he should continue taking Zantac while on antibiotics. Will route to provider to advise.

## 2017-12-04 ENCOUNTER — Other Ambulatory Visit: Payer: Self-pay

## 2017-12-04 MED ORDER — SEMAGLUTIDE(0.25 OR 0.5MG/DOS) 2 MG/1.5ML ~~LOC~~ SOPN: 0.5000 mg | PEN_INJECTOR | SUBCUTANEOUS | 2 refills | Status: DC

## 2017-12-13 ENCOUNTER — Ambulatory Visit: Payer: 59 | Admitting: Physician Assistant

## 2017-12-13 VITALS — BP 122/78 | HR 99 | Temp 97.6°F | Resp 16 | Ht 70.0 in | Wt 178.0 lb

## 2017-12-13 DIAGNOSIS — A048 Other specified bacterial intestinal infections: Secondary | ICD-10-CM

## 2017-12-13 DIAGNOSIS — R142 Eructation: Secondary | ICD-10-CM

## 2017-12-13 DIAGNOSIS — R197 Diarrhea, unspecified: Secondary | ICD-10-CM | POA: Diagnosis not present

## 2017-12-13 NOTE — Progress Notes (Signed)
PRIMARY [facility] AT [facility] [address] [place] [address] [phone]  Date:  12/13/2017   Name:  Joseph Sharp   DOB:  [birth year]   MRN:  [id number]  PCP:  Patient, No Pcp Per    History of Present Illness:  Joseph Sharp is a 67 y.o. male patient who presents to PCP with  Chief Complaint  Patient presents with  . burping    re check/ pt states he feels better from burping, but stools still bout the same     Patient is here for follow up.  He is here for follow up of diarrhea and belching sensation.  He was seen here 2 weeks ago for this, and dxd with h pylori.   He started treatment 10 days ago, and notes improvement fo the diarrhea.  Loose stool but not as runny and forming more.  His belching has improved as well.  He is compliant with the multi-pharm tx.    Patient Active Problem List   Diagnosis Date Noted  . ED (erectile dysfunction) of organic origin 09/04/2014  . Allergic rhinitis 08/10/2014  . Diabetes mellitus, stable ([place]) 06/17/2013  . Pure hypercholesterolemia 06/17/2013    Past Medical History:  Diagnosis Date  . Allergy   . Diabetes mellitus without complication [facility])     Past Surgical History:  Procedure Laterality Date  . EYE SURGERY  01/2013   Eyelid surgery    Social History   Tobacco Use  . Smoking status: Never Smoker  . Smokeless tobacco: Never Used  Substance Use Topics  . Alcohol use: No  . Drug use: No    Family History  Problem Relation Age of Onset  . Diabetes Mother   . Arthritis Mother   . Diabetes Brother   . Cancer Neg Hx   . Heart disease Neg Hx     No Known Allergies  Medication list has been reviewed and updated.  Current Outpatient Medications on File Prior to Visit  Medication Sig Dispense Refill  . amoxicillin (AMOXIL) 500 MG capsule Take 2 capsules (1,000 mg total) by mouth 2 (two) times daily. 56 capsule 0  . aspirin 81 MG tablet Take 81 mg by mouth daily.    . Azelastine HCl 0.15 % SOLN     .  cholecalciferol (VITAMIN D) 1000 UNITS tablet Take 1,000 Units by mouth daily.    . clarithromycin (BIAXIN) 500 MG tablet Take 1 tablet (500 mg total) by mouth 2 (two) times daily. 28 tablet 0  . EPIPEN 2-PAK 0.3 MG/0.3ML SOAJ injection     . fluticasone (FLONASE) 50 MCG/ACT nasal spray     . FREESTYLE LITE test strip USE AS INSTRUCTED TO CHECK BLOOD SUGAR ONCE A DAY 50 each 5  . glipiZIDE (GLUCOTROL XL) 2.5 MG 24 hr tablet TAKE 1 TABLET BY MOUTH EVERY DAY WITH BREAKFAST 90 tablet 2  . lovastatin (MEVACOR) 20 MG tablet TAKE 1 TABLET BY MOUTH EVERY DAY 90 tablet 0  . metFORMIN (GLUCOPHAGE-XR) 500 MG 24 hr tablet TAKE 4 TABLETS BY MOUTH EVERY DAY 360 tablet 1  . omeprazole (PRILOSEC) 20 MG capsule Take 1 capsule (20 mg total) by mouth 2 (two) times daily before a meal. 60 capsule 1  . pioglitazone (ACTOS) 30 MG tablet Take 1 tablet (30 mg total) by mouth daily. 90 tablet 1  . ramipril (ALTACE) 2.5 MG capsule TAKE ONE CAPSULE BY MOUTH EVERY DAY 30 capsule 1  . ranitidine (ZANTAC) 150 MG tablet Take  1 tablet (150 mg total) by mouth 2 (two) times daily. 60 tablet 0  . Semaglutide (OZEMPIC) 0.25 or 0.5 MG/DOSE SOPN Inject 0.5 mg into the skin once a week. 2 pen 2  . tadalafil (CIALIS) 20 MG tablet Take 1 tablet (20 mg total) by mouth daily as needed for erectile dysfunction. 5 tablet 3   No current facility-administered medications on file prior to visit.     ROS ROS otherwise unremarkable unless listed above.  Physical Examination: BP 122/78   Pulse 99   Temp 97.6 F (36.4 C) (Oral)   Resp 16   Ht 5\' 10"  (1.778 m)   Wt 178 lb (80.7 kg)   SpO2 99%   BMI 25.54 kg/m  Ideal Body Weight: Weight in (lb) to have BMI = 25: 173.9  Physical Exam   Assessment and Plan: [name] is a 67 y.o. male who is here today for cc of  Chief Complaint  Patient presents with  . burping    re check/ pt states he feels better from burping, but stools still bout the same  --tx with improved  symptoms.  He will continue regimen to completion.   --follow up diabetes in 3-4 weeks for a1c recheck and firm completion of h pylori tx.  He can hold off on stool culture ordered prior, as his sxs are resolving. H. pylori infection  Diarrhea, unspecified type  Eructation  Ivar Drape, PA-C [facility] 2/10/20196:45 PM

## 2017-12-13 NOTE — Patient Instructions (Addendum)
Continue the medication to completion.  I am glad this is progressing. We shall see you soon!    IF you received an x-ray today, you will receive an invoice from [facility]. Please contact [facility] at [phone] with questions or concerns regarding your invoice.   IF you received labwork today, you will receive an invoice from [place]. Please contact [facility] at [phone] with questions or concerns regarding your invoice.   Our billing staff will not be able to assist you with questions regarding bills from these companies.  You will be contacted with the lab results as soon as they are available. The fastest way to get your results is to activate your My Chart account. Instructions are located on the last page of this paperwork. If you have not heard from [place] regarding the results in 2 weeks, please contact this office.

## 2017-12-23 ENCOUNTER — Encounter: Payer: Self-pay | Admitting: Physician Assistant

## 2018-01-03 ENCOUNTER — Other Ambulatory Visit: Payer: Self-pay

## 2018-01-03 ENCOUNTER — Encounter: Payer: Self-pay | Admitting: Physician Assistant

## 2018-01-03 ENCOUNTER — Ambulatory Visit: Payer: 59 | Admitting: Physician Assistant

## 2018-01-03 VITALS — BP 108/64 | HR 84 | Temp 98.3°F | Resp 16 | Ht 70.0 in | Wt 176.2 lb

## 2018-01-03 DIAGNOSIS — A048 Other specified bacterial intestinal infections: Secondary | ICD-10-CM | POA: Diagnosis not present

## 2018-01-03 DIAGNOSIS — R142 Eructation: Secondary | ICD-10-CM | POA: Diagnosis not present

## 2018-01-03 DIAGNOSIS — Z09 Encounter for follow-up examination after completed treatment for conditions other than malignant neoplasm: Secondary | ICD-10-CM | POA: Diagnosis not present

## 2018-01-03 NOTE — Patient Instructions (Signed)
     IF you received an x-ray today, you will receive an invoice from [facility]. Please contact  Radiology at [phone] with questions or concerns regarding your invoice.   IF you received labwork today, you will receive an invoice from [facility]. Please contact [facility] at [phone] with questions or concerns regarding your invoice.   Our billing staff will not be able to assist you with questions regarding bills from these companies.  You will be contacted with the lab results as soon as they are available. The fastest way to get your results is to activate your My Chart account. Instructions are located on the last page of this paperwork. If you have not heard from us regarding the results in 2 weeks, please contact this office.     

## 2018-01-03 NOTE — Progress Notes (Signed)
PRIMARY CARE AT [facility] [address], [place] [address] [phone]  Date:  01/03/2018   Name:  Joseph Sharp   DOB:  07-16-1951   MRN:  [id number]  PCP:  Patient, No Pcp Per    History of Present Illness:  Joseph Sharp is a 67 y.o. male patient who presents to PCP with  Chief Complaint  Patient presents with  . Follow-up    h pylori and diabetes ( said he sees dr Dwyane Dee for diabetes)   Patient is here for follow-up of H. pylori.  He continues to have improvement of his symptoms. The loose stools have diminished.  He also has noted that the burping has resolved as well.  He has and continues to deny abdominal pain.  No black or bloody stool. Patient does have diabetes which is followed by Dr. Dwyane Dee.  He has an appointment within the next several weeks.   Patient Active Problem List   Diagnosis Date Noted  . ED (erectile dysfunction) of organic origin 09/04/2014  . Allergic rhinitis 08/10/2014  . Diabetes mellitus, stable ([place]) 06/17/2013  . Pure hypercholesterolemia 06/17/2013    Past Medical History:  Diagnosis Date  . Allergy   . Diabetes mellitus without complication [facility])     Past Surgical History:  Procedure Laterality Date  . EYE SURGERY  01/2013   Eyelid surgery    Social History   Tobacco Use  . Smoking status: Never Smoker  . Smokeless tobacco: Never Used  Substance Use Topics  . Alcohol use: No  . Drug use: No    Family History  Problem Relation Age of Onset  . Diabetes Mother   . Arthritis Mother   . Diabetes Brother   . Cancer Neg Hx   . Heart disease Neg Hx     No Known Allergies  Medication list has been reviewed and updated.  Current Outpatient Medications on File Prior to Visit  Medication Sig Dispense Refill  . amoxicillin (AMOXIL) 500 MG capsule Take 2 capsules (1,000 mg total) by mouth 2 (two) times daily. 56 capsule 0  . aspirin 81 MG tablet Take 81 mg by mouth daily.    . Azelastine HCl 0.15 % SOLN     .  cholecalciferol (VITAMIN D) 1000 UNITS tablet Take 1,000 Units by mouth daily.    . clarithromycin (BIAXIN) 500 MG tablet Take 1 tablet (500 mg total) by mouth 2 (two) times daily. 28 tablet 0  . EPIPEN 2-PAK 0.3 MG/0.3ML SOAJ injection     . fluticasone (FLONASE) 50 MCG/ACT nasal spray     . FREESTYLE LITE test strip USE AS INSTRUCTED TO CHECK BLOOD SUGAR ONCE A DAY 50 each 5  . glipiZIDE (GLUCOTROL XL) 2.5 MG 24 hr tablet TAKE 1 TABLET BY MOUTH EVERY DAY WITH BREAKFAST 90 tablet 2  . lovastatin (MEVACOR) 20 MG tablet TAKE 1 TABLET BY MOUTH EVERY DAY 90 tablet 0  . metFORMIN (GLUCOPHAGE-XR) 500 MG 24 hr tablet TAKE 4 TABLETS BY MOUTH EVERY DAY 360 tablet 1  . omeprazole (PRILOSEC) 20 MG capsule Take 1 capsule (20 mg total) by mouth 2 (two) times daily before a meal. 60 capsule 1  . pioglitazone (ACTOS) 30 MG tablet Take 1 tablet (30 mg total) by mouth daily. 90 tablet 1  . ramipril (ALTACE) 2.5 MG capsule TAKE ONE CAPSULE BY MOUTH EVERY DAY 30 capsule 1  . ranitidine (ZANTAC) 150 MG tablet Take 1 tablet (150 mg total) by mouth 2 (two)  times daily. 60 tablet 0  . Semaglutide (OZEMPIC) 0.25 or 0.5 MG/DOSE SOPN Inject 0.5 mg into the skin once a week. 2 pen 2  . tadalafil (CIALIS) 20 MG tablet Take 1 tablet (20 mg total) by mouth daily as needed for erectile dysfunction. 5 tablet 3   No current facility-administered medications on file prior to visit.     ROS ROS otherwise unremarkable unless listed above.  Physical Examination: There were no vitals taken for this visit. Ideal Body Weight:    Physical Exam  Constitutional: He is oriented to person, place, and time. He appears well-developed and well-nourished. No distress.  HENT:  Head: Normocephalic and atraumatic.  Eyes: Pupils are equal, round, and reactive to light. Conjunctivae and EOM are normal.  Cardiovascular: Normal rate.  Pulmonary/Chest: Effort normal. No respiratory distress.  Abdominal: Soft. Normal appearance and bowel  sounds are normal. There is no tenderness.  Neurological: He is alert and oriented to person, place, and time.  Skin: Skin is warm and dry. He is not diaphoretic.  Psychiatric: He has a normal mood and affect. His behavior is normal.     Assessment and Plan: Joseph Sharp is a 67 y.o. male who is here today for cc of  Chief Complaint  Patient presents with  . Follow-up    h pylori and diabetes ( said he sees dr Dwyane Dee for diabetes)  Appears to be stable.  He will follow-up with Dr. Dwyane Dee regarding his diabetes. H. pylori infection  Burping  Follow up  Ivar Drape, PA-C [facility] 3/29/20197:16 AM

## 2018-01-04 ENCOUNTER — Other Ambulatory Visit (INDEPENDENT_AMBULATORY_CARE_PROVIDER_SITE_OTHER): Payer: 59

## 2018-01-04 DIAGNOSIS — E1165 Type 2 diabetes mellitus with hyperglycemia: Secondary | ICD-10-CM

## 2018-01-04 LAB — BASIC METABOLIC PANEL
BUN: 20 mg/dL (ref 6–23)
CHLORIDE: 108 meq/L (ref 96–112)
CO2: 26 mEq/L (ref 19–32)
Calcium: 9.9 mg/dL (ref 8.4–10.5)
Creatinine, Ser: 1.07 mg/dL (ref 0.40–1.50)
GFR: 73.73 mL/min (ref >60.00)
Glucose, Bld: 147 mg/dL — ABNORMAL HIGH (ref 70–99)
POTASSIUM: 4.5 meq/L (ref 3.5–5.1)
SODIUM: 141 meq/L (ref 135–145)

## 2018-01-04 LAB — MICROALBUMIN / CREATININE URINE RATIO
CREATININE, U: 171.9 mg/dL
MICROALB UR: 1 mg/dL (ref 0.0–1.9)
MICROALB/CREAT RATIO: 0.6 mg/g (ref 0.0–30.0)

## 2018-01-04 LAB — HEMOGLOBIN A1C: HEMOGLOBIN A1C: 7.1 % — AB (ref 4.6–6.5)

## 2018-01-09 ENCOUNTER — Encounter: Payer: Self-pay | Admitting: Endocrinology

## 2018-01-09 ENCOUNTER — Ambulatory Visit: Payer: 59 | Admitting: Endocrinology

## 2018-01-09 VITALS — BP 112/68 | HR 91 | Ht 70.0 in | Wt 175.0 lb

## 2018-01-09 DIAGNOSIS — E1165 Type 2 diabetes mellitus with hyperglycemia: Secondary | ICD-10-CM | POA: Diagnosis not present

## 2018-01-09 NOTE — Progress Notes (Signed)
Patient ID: Joseph Sharp, male   DOB: Dec 06, 1950, 67 y.o.   MRN: [id number]   Reason for Appointment: follow-up for diabetes and blood pressure   History of Present Illness    Diagnosis: Type 2 DIABETES MELITUS, diagnosed in 1977     PAST history: He has had long-standing diabetes managed with multiple drugs and usually well controlled His blood sugar control is somewhat dependent on his compliance with diet and exercise regimen  In 11 /14 his blood sugars were higher overall and A1c was 7% which was higher than usual Because of this he was started on Trulicity injections, 5.63 mg He did benefit overall from starting 8.75 mg Trulicity  RECENT history:  His A1c is back up to 7.1, previously was slightly better at 6.6  Non-insulin hypoglycemic drugs: Glipizide ER 2.5 mg daily , metformin 2g, pioglitazone 30 mg,  0.5 mg weekly  Current management, blood sugar patterns and problems identified:  He is still not making better choices for his meals especially when eating out and blood sugars overall are higher  However the last couple of weeks his blood sugars are relatively better  Again his FASTING blood sugars are tending to be high, only once below 120  Has only sporadic postprandial readings which are not consistently high  Does not find the time and not motivated to exercise  He is regular with taking his OZEMPIC and other medications  Is on a trial of Ozempic for the last month or so, previously on Trulicity  No hypoglycemia  Side effects from medications: None  Monitors blood glucose:  1+ times a day     Glucometer:  FreeStyle         Blood Glucose readings recently from monitor download:   Mean values apply above for all meters except median for One Touch  PRE-MEAL Fasting Lunch Dinner Bedtime Overall  Glucose range: 117-150   99-133  127-177    Mean/median: 135 129 120 140 131             Diet:  Has a egg and toast at breakfast with small  amount of juice and fruit, usually getting low fat meals, not eating at home in the evenings  Dinner usually at 7 pm  EXERCISE: Minimal    Wt Readings from Last 3 Encounters:  01/09/18 175 lb (79.4 kg)  01/03/18 176 lb 3.2 oz (79.9 kg)  12/13/17 178 lb (80.7 kg)   Lab Results  Component Value Date   HGBA1C 7.1 (H) 01/04/2018   HGBA1C 6.6 (H) 10/10/2017   HGBA1C 6.9 (H) 07/10/2017   Lab Results  Component Value Date   MICROALBUR 1.0 01/04/2018   LDLCALC 58 10/10/2017   CREATININE 1.07 01/04/2018     Allergies as of 01/09/2018   No Known Allergies     Medication List        Accurate as of 01/09/18  9:57 AM. Always use your most recent med list.          aspirin 81 MG tablet Take 81 mg by mouth daily.   Azelastine HCl 0.15 % Soln   cholecalciferol 1000 units tablet Commonly known as:  VITAMIN D Take 1,000 Units by mouth daily.   EPIPEN 2-PAK 0.3 mg/0.3 mL Soaj injection Generic drug:  EPINEPHrine   fluticasone 50 MCG/ACT nasal spray Commonly known as:  FLONASE   FREESTYLE LITE test strip Generic drug:  glucose blood USE AS INSTRUCTED TO CHECK BLOOD SUGAR ONCE A DAY  glipiZIDE 2.5 MG 24 hr tablet Commonly known as:  GLUCOTROL XL TAKE 1 TABLET BY MOUTH EVERY DAY WITH BREAKFAST   lovastatin 20 MG tablet Commonly known as:  MEVACOR TAKE 1 TABLET BY MOUTH EVERY DAY   metFORMIN 500 MG 24 hr tablet Commonly known as:  GLUCOPHAGE-XR TAKE 4 TABLETS BY MOUTH EVERY DAY   pioglitazone 30 MG tablet Commonly known as:  ACTOS Take 1 tablet (30 mg total) by mouth daily.   ramipril 2.5 MG capsule Commonly known as:  ALTACE TAKE ONE CAPSULE BY MOUTH EVERY DAY   Semaglutide 0.25 or 0.5 MG/DOSE Sopn Commonly known as:  OZEMPIC Inject 0.5 mg into the skin once a week.   tadalafil 20 MG tablet Commonly known as:  CIALIS Take 1 tablet (20 mg total) by mouth daily as needed for erectile dysfunction.       Allergies: No Known Allergies  Past Medical  History:  Diagnosis Date  . Allergy   . Diabetes mellitus without complication [facility])     Past Surgical History:  Procedure Laterality Date  . EYE SURGERY  01/2013   Eyelid surgery    Family History  Problem Relation Age of Onset  . Diabetes Mother   . Arthritis Mother   . Diabetes Brother   . Cancer Neg Hx   . Heart disease Neg Hx     Social History:  reports that  has never smoked. he has never used smokeless tobacco. He reports that he does not drink alcohol or use drugs.   Review of Systems    HYPERTENSION: This has been  mild and he is on 2.5 mg ramipril Blood pressure is well-controlled without side effects  BP Readings from Last 3 Encounters:  01/09/18 112/68  01/03/18 108/64  12/13/17 122/78     HYPERLIPIDEMIA: The lipid abnormality consists of elevated LDL and triglycerides and low HDL Consistently, well controlled with lovastatin Has low HDL from metabolic syndrome   Lab Results  Component Value Date   CHOL 117 10/10/2017   HDL 36.30 (L) 10/10/2017   LDLCALC 58 10/10/2017   TRIG 113.0 10/10/2017   CHOLHDL 3 10/10/2017    He previously had transient hypogonadotropic hypogonadism which subsequently had resolved.  Was previously treated with AndroGel. He has had mild chronic nonspecific fatigue   Lab Results  Component Value Date   TESTOSTERONE 354 07/10/2017    He takes vitamin D 1000 units as a supplement  Lab Results  Component Value Date   VD25OH 46.46 04/03/2017          Examination:   BP 112/68   Pulse 91   Ht 5\' 10"  (1.778 m)   Wt 175 lb (79.4 kg)   SpO2 96%   BMI 25.11 kg/m   Body mass index is 25.11 kg/m.       Physical Exam   ASSESSMENT/ PLAN:    DIABETES, non-insulin-dependent:   See history of present illness for detailed discussion of current diabetes management, blood sugar patterns and problems identified  His blood sugars have overall been higher mostly because of poor diet overall and no exercise He has  just started taking Ozempic over the last month or so and not clear as yet if this is better than Trulicity However the last couple of weeks he has not had any significant hyperglycemia except sporadically  He thinks he can do much better when he retires in about a month or so with lifestyle changes Currently tolerating Ozempic and will continue Encouraged  him to start regular walking and cut back on high-fat high carbohydrate meals 1C tired Offered consultation with dietitian but he does not want to do so  Mild hypertension: Well controlled, renal function and potassium stable  There are no Patient Instructions on file for this visit.   Elayne Snare 01/09/2018, 9:57 AM   Note: This office note was prepared with Dragon voice recognition system technology. Any transcriptional errors that result from this process are unintentional.

## 2018-01-09 NOTE — Patient Instructions (Signed)
More exercise   

## 2018-02-20 ENCOUNTER — Encounter: Payer: Self-pay | Admitting: Physician Assistant

## 2018-02-21 ENCOUNTER — Encounter: Payer: Self-pay | Admitting: Urgent Care

## 2018-02-21 ENCOUNTER — Ambulatory Visit: Payer: 59 | Admitting: Urgent Care

## 2018-02-21 ENCOUNTER — Other Ambulatory Visit: Payer: Self-pay

## 2018-02-21 VITALS — BP 120/80 | HR 72 | Temp 97.7°F | Resp 16 | Ht 69.0 in | Wt 175.6 lb

## 2018-02-21 DIAGNOSIS — R1013 Epigastric pain: Secondary | ICD-10-CM | POA: Diagnosis not present

## 2018-02-21 DIAGNOSIS — Z8619 Personal history of other infectious and parasitic diseases: Secondary | ICD-10-CM | POA: Diagnosis not present

## 2018-02-21 DIAGNOSIS — R142 Eructation: Secondary | ICD-10-CM | POA: Diagnosis not present

## 2018-02-21 DIAGNOSIS — R21 Rash and other nonspecific skin eruption: Secondary | ICD-10-CM | POA: Diagnosis not present

## 2018-02-21 MED ORDER — CLINDAMYCIN PHOSPHATE 1 % EX GEL: Freq: Two times a day (BID) | CUTANEOUS | 0 refills | Status: DC

## 2018-02-21 MED ORDER — KETOCONAZOLE 2 % EX CREA: 1.0000 "application " | TOPICAL_CREAM | Freq: Every day | CUTANEOUS | 0 refills | Status: DC

## 2018-02-21 MED ORDER — OMEPRAZOLE 20 MG PO CPDR: 20.0000 mg | DELAYED_RELEASE_CAPSULE | Freq: Every day | ORAL | 3 refills | Status: DC

## 2018-02-21 NOTE — Progress Notes (Signed)
    MRN: [id number] DOB: 25-Sep-1951  Subjective:   Joseph Sharp is a 67 y.o. male presenting for recurrent episode of burping and groin rash.  He has a history of H. pylori infection treated almost a year ago.  His current symptoms are similar to those previously had.  Today, reports about a 1 week history of burping and epigastric discomfort.  Denies fever, nausea vomiting, belly pain.  Also reports 1 month history of intermittent rash.  Rash is primarily itchy in his groin area and initially did well with terbinafine but has come back.  Denies painful rash, blisters, dysuria, hematuria.   Joseph Sharp has a current medication list which includes the following prescription(s): epipen 2-pak, fluticasone, freestyle lite, glipizide, lovastatin, metformin, pioglitazone, ramipril, semaglutide, tadalafil, aspirin, azelastine hcl, and cholecalciferol. Also has No Known Allergies.  Joseph Sharp  has a past medical history of Allergy and Diabetes mellitus without complication ([place]). Also  has a past surgical history that includes Eye surgery (01/2013).  Objective:   Vitals: BP 120/80 (BP Location: Right Arm)   Pulse 72   Temp 97.7 F (36.5 C) (Oral)   Resp 16   Ht 5\' 9"  (1.753 m)   Wt 175 lb 9.6 oz (79.7 kg)   SpO2 98%   BMI 25.93 kg/m   Physical Exam  Constitutional: He is oriented to person, place, and time. He appears well-developed and well-nourished.  HENT:  Mouth/Throat: Oropharynx is clear and moist.  Eyes: No scleral icterus.  Cardiovascular: Normal rate, regular rhythm and intact distal pulses. Exam reveals no gallop and no friction rub.  No murmur heard. Pulmonary/Chest: No respiratory distress. He has no wheezes. He has no rales.  Abdominal: Soft. Bowel sounds are normal. He exhibits no distension and no mass. There is no tenderness. There is no rebound and no guarding.  Neurological: He is alert and oriented to person, place, and time.  Skin: Skin is warm and dry. Rash  (Erythematous rash in inguinal folds, slightly salmon colored under [place] lamp) noted.  Psychiatric: He has a normal mood and affect.   Assessment and Plan :   Burping - Plan: H. pylori breath test  History of Helicobacter pylori infection - Plan: H. pylori breath test  Epigastric discomfort - Plan: H. pylori breath test  Rash of groin  H. pylori test pending, will have patient start omeprazole.  Counseled on tinea cruris and will use ketoconazole.  However also discussed with patient possibility of erythrasma.  Patient is to use clindamycin gel if this rash persists despite ketoconazole cream.  Follow-up in 2-4 weeks.  Jaynee Eagles, PA-C Primary Care at [place] [phone] 02/21/2018  9:25 AM

## 2018-02-21 NOTE — Addendum Note (Signed)
Addended by: Burnis Kingfisher on: 02/21/2018 10:25 AM   Modules accepted: Orders

## 2018-02-21 NOTE — Patient Instructions (Addendum)
We will try omeprazole to address your burping.  Try to avoid acidic foods.  For your rash let us use ketoconazole cream.  But if you have no improvement over the next week and a half or 2 weeks, then please start taking the clindamycin gel to address erythrasma.    IF you received an x-ray today, you will receive an invoice from [facility]. Please contact [facility] at [phone] with questions or concerns regarding your invoice.   IF you received labwork today, you will receive an invoice from [place]. Please contact [facility] at [phone] with questions or concerns regarding your invoice.   Our billing staff will not be able to assist you with questions regarding bills from these companies.  You will be contacted with the lab results as soon as they are available. The fastest way to get your results is to activate your My Chart account. Instructions are located on the last page of this paperwork. If you have not heard from [place] regarding the results in 2 weeks, please contact this office.

## 2018-02-22 LAB — H. PYLORI BREATH TEST: H pylori Breath Test: NEGATIVE

## 2018-02-22 LAB — H. PYLORI BREATH COLLECTION

## 2018-03-08 ENCOUNTER — Ambulatory Visit: Payer: 59 | Admitting: Urgent Care

## 2018-03-08 ENCOUNTER — Encounter: Payer: Self-pay | Admitting: Urgent Care

## 2018-03-08 VITALS — BP 119/75 | HR 68 | Temp 97.9°F | Resp 16 | Ht 69.0 in | Wt 176.4 lb

## 2018-03-08 DIAGNOSIS — R21 Rash and other nonspecific skin eruption: Secondary | ICD-10-CM | POA: Diagnosis not present

## 2018-03-08 LAB — HM DIABETES EYE EXAM

## 2018-03-08 MED ORDER — FLUCONAZOLE 150 MG PO TABS: 150.0000 mg | ORAL_TABLET | ORAL | 0 refills | Status: DC

## 2018-03-08 NOTE — Patient Instructions (Addendum)
Keep using the ketoconazole and clindagel for the next 1-2 weeks. Use the oral antifungal, take 2 doses, 1 week apart.    IF you received an x-ray today, you will receive an invoice from [facility]. Please contact [facility] at [phone] with questions or concerns regarding your invoice.   IF you received labwork today, you will receive an invoice from [place]. Please contact [facility] at [phone] with questions or concerns regarding your invoice.   Our billing staff will not be able to assist you with questions regarding bills from these companies.  You will be contacted with the lab results as soon as they are available. The fastest way to get your results is to activate your My Chart account. Instructions are located on the last page of this paperwork. If you have not heard from [place] regarding the results in 2 weeks, please contact this office.

## 2018-03-08 NOTE — Progress Notes (Signed)
    MRN: [id number] DOB: [birth year]  Subjective:   Joseph Sharp is a 67 y.o. male presenting for follow up on groin rash.  Patient has been using ketoconazole and Clindagel.  Denies fever, dysuria, penile pain, itching, drainage of pus or bleeding, painful rash.  States that he feels like he does not know if it is better or worse and admits that he just once complete resolution.  Joseph Sharp has a current medication list which includes the following prescription(s): azelastine hcl, cholecalciferol, clindamycin, epipen 2-pak, fluticasone, freestyle lite, glipizide, ketoconazole, lovastatin, metformin, pioglitazone, ramipril, semaglutide, and tadalafil. Also has No Known Allergies.  Joseph Sharp  has a past medical history of Allergy and Diabetes mellitus without complication ([place]). Also  has a past surgical history that includes Eye surgery (01/2013).  Objective:   Vitals: BP 119/75   Pulse 68   Temp 97.9 F (36.6 C) (Oral)   Resp 16   Ht 5\' 9"  (1.753 m)   Wt 176 lb 6.4 oz (80 kg)   SpO2 97%   BMI 26.05 kg/m   Physical Exam  Constitutional: He is oriented to person, place, and time. He appears well-developed and well-nourished.  Cardiovascular: Normal rate.  Pulmonary/Chest: Effort normal.  Genitourinary:     Neurological: He is alert and oriented to person, place, and time.    Assessment and Plan :   Rash of groin  Clinically I counseled patient that there is significant improvement from his previous physical exam.  I counseled that sometimes this can take a few weeks to completely resolve the patient wants to be more aggressive with treatment.  I counseled that he can continue both ketoconazole and Clindagel.  We will also use 2 doses of Diflucan orally 1 week apart.  Follow-up if symptoms persist.  Jaynee Eagles, PA-C [facility] [phone] 03/08/2018 11:17 AM

## 2018-03-22 ENCOUNTER — Other Ambulatory Visit: Payer: Self-pay | Admitting: Endocrinology

## 2018-03-24 DIAGNOSIS — H66001 Acute suppurative otitis media without spontaneous rupture of ear drum, right ear: Secondary | ICD-10-CM | POA: Diagnosis not present

## 2018-03-24 DIAGNOSIS — J209 Acute bronchitis, unspecified: Secondary | ICD-10-CM | POA: Diagnosis not present

## 2018-03-29 ENCOUNTER — Other Ambulatory Visit: Payer: Self-pay | Admitting: Endocrinology

## 2018-03-29 DIAGNOSIS — H66001 Acute suppurative otitis media without spontaneous rupture of ear drum, right ear: Secondary | ICD-10-CM | POA: Diagnosis not present

## 2018-03-29 DIAGNOSIS — H6591 Unspecified nonsuppurative otitis media, right ear: Secondary | ICD-10-CM | POA: Diagnosis not present

## 2018-04-04 ENCOUNTER — Other Ambulatory Visit (INDEPENDENT_AMBULATORY_CARE_PROVIDER_SITE_OTHER): Payer: Medicare Other

## 2018-04-04 DIAGNOSIS — E1165 Type 2 diabetes mellitus with hyperglycemia: Secondary | ICD-10-CM

## 2018-04-04 LAB — BASIC METABOLIC PANEL
BUN: 17 mg/dL (ref 6–23)
CALCIUM: 9.6 mg/dL (ref 8.4–10.5)
CO2: 25 meq/L (ref 19–32)
CREATININE: 1.07 mg/dL (ref 0.40–1.50)
Chloride: 107 mEq/L (ref 96–112)
GFR: 73.68 mL/min (ref >60.00)
Glucose, Bld: 155 mg/dL — ABNORMAL HIGH (ref 70–99)
Potassium: 4.4 mEq/L (ref 3.5–5.1)
Sodium: 140 mEq/L (ref 135–145)

## 2018-04-04 LAB — HEMOGLOBIN A1C: Hgb A1c MFr Bld: 7.1 % — ABNORMAL HIGH (ref 4.6–6.5)

## 2018-04-09 ENCOUNTER — Encounter: Payer: Self-pay | Admitting: Endocrinology

## 2018-04-09 ENCOUNTER — Ambulatory Visit (INDEPENDENT_AMBULATORY_CARE_PROVIDER_SITE_OTHER): Payer: Medicare Other | Admitting: Endocrinology

## 2018-04-09 VITALS — BP 122/84 | HR 91 | Ht 69.0 in | Wt 176.2 lb

## 2018-04-09 DIAGNOSIS — I10 Essential (primary) hypertension: Secondary | ICD-10-CM

## 2018-04-09 DIAGNOSIS — E1165 Type 2 diabetes mellitus with hyperglycemia: Secondary | ICD-10-CM

## 2018-04-09 DIAGNOSIS — E7800 Pure hypercholesterolemia, unspecified: Secondary | ICD-10-CM

## 2018-04-09 MED ORDER — LISINOPRIL 5 MG PO TABS: 5.0000 mg | ORAL_TABLET | Freq: Every day | ORAL | 3 refills | Status: DC

## 2018-04-09 NOTE — Progress Notes (Signed)
Patient ID: Joseph Sharp, male   DOB: [birth year], 67 y.o.   MRN: [id number]   Reason for Appointment: follow-up for diabetes and blood pressure   History of Present Illness    Diagnosis: Type 2 DIABETES MELITUS, diagnosed in 1977     PAST history: He has had long-standing diabetes managed with multiple drugs and usually well controlled His blood sugar control is somewhat dependent on his compliance with diet and exercise regimen  In 11 /14 his blood sugars were higher overall and A1c was 7% which was higher than usual Because of this he was started on Trulicity injections, 0.17 mg He did benefit overall from starting 5.10 mg Trulicity  RECENT history:  His A1c is again up to 7.1, previously was slightly better at 6.6  Non-insulin hypoglycemic drugs: Glipizide ER 2.5 mg daily , metformin 2g, pioglitazone 30 mg, Ozempic 0.5 mg weekly  Current management, blood sugar patterns and problems identified:  He is having a relatively high A1c  This is with switching to Ozempic from Trulicity  C5E is higher is despite his trying to be exercising at the gym now  He thinks he is regular with taking his Ozempic and the other medication  However he is checking blood sugars very sporadically and mostly before meals  Previously would have readings as high as 177 at night  He tends to have relatively high fasting readings however, highest at home was 160 with eating ice cream the night before  He is eating out frequently despite not working full-time and not always making good choices  Previously had been reluctant to see the dietitian   Side effects from medications: None  Monitors blood glucose:  1+ times a day     Glucometer:  FreeStyle         Blood Glucose readings recently from monitor download:   Mean values apply above for all meters except median for One Touch  PRE-MEAL Fasting Lunch Dinner Bedtime Overall  Glucose range: 117-150   99-133  127-177      Mean/median: 135 129 120 140 131             Diet:  Has a egg and toast at breakfast with small amount of juice and fruit, usually getting low fat meals,  Dinner usually at 7 pm  EXERCISE: Walks 3/7 at gym    Wt Readings from Last 3 Encounters:  04/09/18 176 lb 3.2 oz (79.9 kg)  03/08/18 176 lb 6.4 oz (80 kg)  02/21/18 175 lb 9.6 oz (79.7 kg)   Lab Results  Component Value Date   HGBA1C 7.1 (H) 04/04/2018   HGBA1C 7.1 (H) 01/04/2018   HGBA1C 6.6 (H) 10/10/2017   Lab Results  Component Value Date   MICROALBUR 1.0 01/04/2018   LDLCALC 58 10/10/2017   CREATININE 1.07 04/04/2018     Allergies as of 04/09/2018   No Known Allergies     Medication List        Accurate as of 04/09/18  9:59 AM. Always use your most recent med list.          cholecalciferol 1000 units tablet Commonly known as:  VITAMIN D Take 1,000 Units by mouth daily.   EPIPEN 2-PAK 0.3 mg/0.3 mL Soaj injection Generic drug:  EPINEPHrine   fluticasone 50 MCG/ACT nasal spray Commonly known as:  FLONASE   FREESTYLE LITE test strip Generic drug:  glucose blood USE AS INSTRUCTED TO CHECK BLOOD SUGAR ONCE A DAY  glipiZIDE 2.5 MG 24 hr tablet Commonly known as:  GLUCOTROL XL TAKE 1 TABLET BY MOUTH EVERY DAY WITH BREAKFAST   lovastatin 20 MG tablet Commonly known as:  MEVACOR TAKE 1 TABLET BY MOUTH EVERY DAY   metFORMIN 500 MG 24 hr tablet Commonly known as:  GLUCOPHAGE-XR TAKE 4 TABLETS BY MOUTH EVERY DAY   OZEMPIC 0.25 or 0.5 MG/DOSE Sopn Generic drug:  Semaglutide INJECT 0.5 MG INTO THE SKIN ONCE A WEEK.   pioglitazone 30 MG tablet Commonly known as:  ACTOS Take 1 tablet (30 mg total) by mouth daily.   ramipril 2.5 MG capsule Commonly known as:  ALTACE TAKE ONE CAPSULE BY MOUTH EVERY DAY   tadalafil 20 MG tablet Commonly known as:  CIALIS Take 1 tablet (20 mg total) by mouth daily as needed for erectile dysfunction.       Allergies: No Known Allergies  Past Medical  History:  Diagnosis Date  . Allergy   . Diabetes mellitus without complication [facility])     Past Surgical History:  Procedure Laterality Date  . EYE SURGERY  01/2013   Eyelid surgery    Family History  Problem Relation Age of Onset  . Diabetes Mother   . Arthritis Mother   . Diabetes Brother   . Cancer Neg Hx   . Heart disease Neg Hx     Social History:  reports that he has never smoked. He has never used smokeless tobacco. He reports that he does not drink alcohol or use drugs.   Review of Systems    HYPERTENSION: This has been  mild and he has been on 2.5 mg ramipril He says he was feeling dizzy when he would be working on his garden bending down and this is better with his not taking the ramipril in the last month when he was given ketoconazole by urgent care  BP Readings from Last 3 Encounters:  04/09/18 122/84  03/08/18 119/75  02/21/18 120/80      HYPERLIPIDEMIA: The lipid abnormality consists of elevated LDL and triglycerides and low HDL Consistently, well controlled with lovastatin Has low HDL from metabolic syndrome   Lab Results  Component Value Date   CHOL 117 10/10/2017   HDL 36.30 (L) 10/10/2017   LDLCALC 58 10/10/2017   TRIG 113.0 10/10/2017   CHOLHDL 3 10/10/2017    He previously had transient hypogonadotropic hypogonadism which subsequently had resolved.  Was previously treated with AndroGel. He has had mild chronic nonspecific fatigue   Lab Results  Component Value Date   TESTOSTERONE 354 07/10/2017    He takes vitamin D 1000 units as a supplement  Lab Results  Component Value Date   VD25OH 46.46 04/03/2017          Examination:   BP 122/84 (BP Location: Left Arm, Patient Position: Sitting, Cuff Size: Normal)   Pulse 91   Ht 5\' 9"  (1.753 m)   Wt 176 lb 3.2 oz (79.9 kg)   SpO2 95%   BMI 26.02 kg/m   Body mass index is 26.02 kg/m.       Physical Exam   ASSESSMENT/ PLAN:    DIABETES, non-insulin-dependent:   See  history of present illness for detailed discussion of current diabetes management, blood sugar patterns and problems identified  His blood sugars have overall been again not as well controlled but even though A1c is 7.1 his home readings averaging 122 Most likely has high readings after meals especially supper which he is not monitoring However  has done some exercise and blood sugars appear to be fairly good later part of the day and still relatively high in the morning  He thinks he can do much better and even though he is not working full-time he is not planning his meals well May also need to get prior authorization for Ozempic since he was told it is not covered   Mild hypertension: Blood pressure is again higher but mostly diastolic without his Altace He can try taking lisinopril 5 mg daily in the evening instead  He will have his initial Medicare physical in the next visit    There are no Patient Instructions on file for this visit.   Elayne Snare 04/09/2018, 9:59 AM   Note: This office note was prepared with Dragon voice recognition system technology. Any transcriptional errors that result from this process are unintentional.

## 2018-04-09 NOTE — Patient Instructions (Addendum)
Call sugars after dinner in 2 weeks

## 2018-04-10 ENCOUNTER — Other Ambulatory Visit: Payer: Self-pay

## 2018-04-10 MED ORDER — SEMAGLUTIDE(0.25 OR 0.5MG/DOS) 2 MG/1.5ML ~~LOC~~ SOPN: 0.5000 mg | PEN_INJECTOR | SUBCUTANEOUS | 2 refills | Status: DC

## 2018-05-19 ENCOUNTER — Other Ambulatory Visit: Payer: Self-pay | Admitting: Endocrinology

## 2018-06-23 ENCOUNTER — Other Ambulatory Visit: Payer: Self-pay | Admitting: Endocrinology

## 2018-06-24 ENCOUNTER — Other Ambulatory Visit: Payer: Self-pay | Admitting: Endocrinology

## 2018-06-25 ENCOUNTER — Other Ambulatory Visit: Payer: Self-pay | Admitting: Endocrinology

## 2018-06-26 ENCOUNTER — Other Ambulatory Visit: Payer: Self-pay | Admitting: Endocrinology

## 2018-06-26 DIAGNOSIS — L859 Epidermal thickening, unspecified: Secondary | ICD-10-CM | POA: Diagnosis not present

## 2018-06-26 DIAGNOSIS — L570 Actinic keratosis: Secondary | ICD-10-CM | POA: Diagnosis not present

## 2018-06-26 DIAGNOSIS — D2261 Melanocytic nevi of right upper limb, including shoulder: Secondary | ICD-10-CM | POA: Diagnosis not present

## 2018-06-26 DIAGNOSIS — Z85828 Personal history of other malignant neoplasm of skin: Secondary | ICD-10-CM | POA: Diagnosis not present

## 2018-06-26 DIAGNOSIS — D225 Melanocytic nevi of trunk: Secondary | ICD-10-CM | POA: Diagnosis not present

## 2018-06-26 DIAGNOSIS — L821 Other seborrheic keratosis: Secondary | ICD-10-CM | POA: Diagnosis not present

## 2018-06-26 DIAGNOSIS — D2262 Melanocytic nevi of left upper limb, including shoulder: Secondary | ICD-10-CM | POA: Diagnosis not present

## 2018-06-26 DIAGNOSIS — D485 Neoplasm of uncertain behavior of skin: Secondary | ICD-10-CM | POA: Diagnosis not present

## 2018-06-26 DIAGNOSIS — D1801 Hemangioma of skin and subcutaneous tissue: Secondary | ICD-10-CM | POA: Diagnosis not present

## 2018-07-01 DIAGNOSIS — H6993 Unspecified Eustachian tube disorder, bilateral: Secondary | ICD-10-CM | POA: Diagnosis not present

## 2018-07-01 DIAGNOSIS — H903 Sensorineural hearing loss, bilateral: Secondary | ICD-10-CM | POA: Diagnosis not present

## 2018-07-01 DIAGNOSIS — H6123 Impacted cerumen, bilateral: Secondary | ICD-10-CM | POA: Diagnosis not present

## 2018-07-03 ENCOUNTER — Other Ambulatory Visit: Payer: Self-pay | Admitting: Endocrinology

## 2018-07-09 ENCOUNTER — Other Ambulatory Visit (INDEPENDENT_AMBULATORY_CARE_PROVIDER_SITE_OTHER): Payer: Medicare Other

## 2018-07-09 DIAGNOSIS — E1165 Type 2 diabetes mellitus with hyperglycemia: Secondary | ICD-10-CM

## 2018-07-09 DIAGNOSIS — I10 Essential (primary) hypertension: Secondary | ICD-10-CM

## 2018-07-09 DIAGNOSIS — E7800 Pure hypercholesterolemia, unspecified: Secondary | ICD-10-CM | POA: Diagnosis not present

## 2018-07-09 LAB — COMPREHENSIVE METABOLIC PANEL
ALK PHOS: 56 U/L (ref 39–117)
ALT: 12 U/L (ref 0–53)
AST: 13 U/L (ref 0–37)
Albumin: 4.2 g/dL (ref 3.5–5.2)
BILIRUBIN TOTAL: 0.4 mg/dL (ref 0.2–1.2)
BUN: 23 mg/dL (ref 6–23)
CO2: 26 meq/L (ref 19–32)
Calcium: 9.9 mg/dL (ref 8.4–10.5)
Chloride: 109 mEq/L (ref 96–112)
Creatinine, Ser: 1.11 mg/dL (ref 0.40–1.50)
GFR: 70.56 mL/min (ref >60.00)
Glucose, Bld: 127 mg/dL — ABNORMAL HIGH (ref 70–99)
Potassium: 4.3 mEq/L (ref 3.5–5.1)
Sodium: 141 mEq/L (ref 135–145)
TOTAL PROTEIN: 6.8 g/dL (ref 6.0–8.3)

## 2018-07-09 LAB — LIPID PANEL
CHOL/HDL RATIO: 4
Cholesterol: 132 mg/dL (ref 0–200)
HDL: 34.3 mg/dL — AB (ref >39.00)
LDL Cholesterol: 77 mg/dL (ref 0–99)
NonHDL: 97.71
TRIGLYCERIDES: 104 mg/dL (ref 0.0–149.0)
VLDL: 20.8 mg/dL (ref 0.0–40.0)

## 2018-07-09 LAB — CBC
HCT: 41.9 % (ref 39.0–52.0)
Hemoglobin: 14 g/dL (ref 13.0–17.0)
MCHC: 33.4 g/dL (ref 30.0–36.0)
MCV: 80.4 fl (ref 78.0–100.0)
PLATELETS: 280 10*3/uL (ref 150.0–400.0)
RBC: 5.22 Mil/uL (ref 4.22–5.81)
RDW: 16 % — ABNORMAL HIGH (ref 11.5–15.5)
WBC: 7.5 10*3/uL (ref 4.0–10.5)

## 2018-07-09 LAB — HEMOGLOBIN A1C: Hgb A1c MFr Bld: 7 % — ABNORMAL HIGH (ref 4.6–6.5)

## 2018-07-11 ENCOUNTER — Encounter: Payer: 59 | Admitting: Endocrinology

## 2018-07-11 ENCOUNTER — Ambulatory Visit (INDEPENDENT_AMBULATORY_CARE_PROVIDER_SITE_OTHER): Payer: Medicare Other | Admitting: Endocrinology

## 2018-07-11 ENCOUNTER — Encounter: Payer: Self-pay | Admitting: Endocrinology

## 2018-07-11 VITALS — BP 118/68 | HR 82 | Ht 69.0 in | Wt 172.0 lb

## 2018-07-11 DIAGNOSIS — E1165 Type 2 diabetes mellitus with hyperglycemia: Secondary | ICD-10-CM | POA: Diagnosis not present

## 2018-07-11 NOTE — Progress Notes (Signed)
Patient ID: HOY FALLERT, male   DOB: [birth year], 67 y.o.   MRN: [id number]   Reason for Appointment: follow-up for diabetes and blood pressure   History of Present Illness    Diagnosis: Type 2 DIABETES MELITUS, diagnosed in 1977     PAST history: He has had long-standing diabetes managed with multiple drugs and usually well controlled His blood sugar control is somewhat dependent on his compliance with diet and exercise regimen  In 11 /14 his blood sugars were higher overall and A1c was 7% which was higher than usual Because of this he was started on Trulicity injections, 2.83 mg He did benefit overall from starting 1.51 mg Trulicity  RECENT history:  His A1c is again up to 7, previously lower  Non-insulin hypoglycemic drugs: Glipizide ER 2.5 mg daily , metformin 2g, pioglitazone 30 mg, Ozempic 0.5 mg weekly  Current management, blood sugar patterns and problems identified:  He has lost some weight since his last visit, about 4 pounds  This is mostly with trying to eat a little better with less restaurant meals and planning meals better  He has been continuing to go walking or other exercise at the gym but only 2 or 3 days a week  No side effects from [employer]  He is not using a generic monitor since his insurance apparently did not cover his freestyle  August fasting readings appear to be higher but the last 3 readings appear to be better but not clear if his new meter is consistent  His lab glucose was also fairly good at 125  Generally not checking readings after meals and sporadically may have readings above 150 and as high as 218  Also no hypoglycemia with lowest blood sugar 110   Side effects from medications: None  Monitors blood glucose:  1+ times a day     Glucometer:  [employer] brand        Blood Glucose readings recently from monitor download:    PRE-MEAL Fasting Lunch Dinner Bedtime Overall  Glucose range:  114-183  129-218     Mean/median:       157   POST-MEAL PC Breakfast PC Lunch PC Dinner  Glucose range:    120-215  Mean/median:                Diet:  Has a egg and toast at breakfast with small amount of juice and fruit, usually getting low fat meals,  Dinner usually at 7 pm  EXERCISE: Walks 2-3/7 at gym    Wt Readings from Last 3 Encounters:  07/11/18 172 lb (78 kg)  04/09/18 176 lb 3.2 oz (79.9 kg)  03/08/18 176 lb 6.4 oz (80 kg)   Lab Results  Component Value Date   HGBA1C 7.0 (H) 07/09/2018   HGBA1C 7.1 (H) 04/04/2018   HGBA1C 7.1 (H) 01/04/2018   Lab Results  Component Value Date   MICROALBUR 1.0 01/04/2018   LDLCALC 77 07/09/2018   CREATININE 1.11 07/09/2018     Allergies as of 07/11/2018   No Known Allergies     Medication List        Accurate as of 07/11/18  3:28 PM. Always use your most recent med list.          cholecalciferol 1000 units tablet Commonly known as:  VITAMIN D Take 1,000 Units by mouth daily.   EPIPEN 2-PAK 0.3 mg/0.3 mL Soaj injection Generic drug:  EPINEPHrine   fluticasone 50 MCG/ACT nasal spray Commonly known  as:  FLONASE   glipiZIDE 2.5 MG 24 hr tablet Commonly known as:  GLUCOTROL XL TAKE 1 TABLET BY MOUTH EVERY DAY WITH BREAKFAST   lisinopril 5 MG tablet Commonly known as:  PRINIVIL,ZESTRIL TAKE 1 TABLET BY MOUTH EVERY DAY   lovastatin 20 MG tablet Commonly known as:  MEVACOR TAKE 1 TABLET BY MOUTH EVERY DAY   metFORMIN 500 MG 24 hr tablet Commonly known as:  GLUCOPHAGE-XR TAKE 4 TABLETS BY MOUTH EVERY DAY   pioglitazone 30 MG tablet Commonly known as:  ACTOS TAKE 1 TABLET BY MOUTH EVERY DAY   Semaglutide 0.25 or 0.5 MG/DOSE Sopn Inject 0.5 mg into the skin once a week.   OZEMPIC 0.25 or 0.5 MG/DOSE Sopn Generic drug:  Semaglutide INJECT 0.5 MG INTO THE SKIN ONCE A WEEK.   tadalafil 20 MG tablet Commonly known as:  ADCIRCA/CIALIS Take 1 tablet (20 mg total) by mouth daily as needed for erectile dysfunction.       Allergies: No Known  Allergies  Past Medical History:  Diagnosis Date  . Allergy   . Diabetes mellitus without complication [facility])     Past Surgical History:  Procedure Laterality Date  . EYE SURGERY  01/2013   Eyelid surgery    Family History  Problem Relation Age of Onset  . Diabetes Mother   . Arthritis Mother   . Diabetes Brother   . Cancer Neg Hx   . Heart disease Neg Hx     Social History:  reports that he has never smoked. He has never used smokeless tobacco. He reports that he does not drink alcohol or use drugs.   Review of Systems    HYPERTENSION: This has been  mild and previously was tried on ramipril and lisinopril He has stopped taking his lisinopril about a month ago since he thought it was making her feel little dizzy again He also thinks his blood pressure is better because of losing weight and better diet Currently not on medications  BP Readings from Last 3 Encounters:  07/11/18 118/68  04/09/18 122/84  03/08/18 119/75      HYPERLIPIDEMIA: The lipid abnormality consists of elevated LDL and triglycerides and low HDL LDL consistently well controlled with lovastatin Has low HDL from metabolic syndrome   Lab Results  Component Value Date   CHOL 132 07/09/2018   HDL 34.30 (L) 07/09/2018   LDLCALC 77 07/09/2018   TRIG 104.0 07/09/2018   CHOLHDL 4 07/09/2018    He previously had transient hypogonadotropic hypogonadism which subsequently had resolved.  Was previously treated with AndroGel. He has had mild chronic nonspecific fatigue   Lab Results  Component Value Date   TESTOSTERONE 354 07/10/2017    He takes vitamin D 1000 units as a supplement  Lab Results  Component Value Date   VD25OH 46.46 04/03/2017          Examination:   BP 118/68   Pulse 82   Ht 5' 9"  (1.753 m)   Wt 172 lb (78 kg)   SpO2 98%   BMI 25.40 kg/m   Body mass index is 25.4 kg/m.       Physical Exam   ASSESSMENT/ PLAN:    DIABETES, non-insulin-dependent:   See history  of present illness for detailed discussion of current diabetes management, blood sugar patterns and problems identified  His A1c is 7%  He is on multiple drugs including Ozempic and Actos Currently using a [employer] brand monitor and appears to be getting very  inconsistent readings with this although recently his lab glucose and home readings appear to correlate in the morning  Discussed that he can get his freestyle test strips on Medicare part B and he will discuss this with the pharmacist Encouraged him to increase frequency of walking for exercise Consistent diet More readings after meals  ?  Hypertension: His blood pressure is normal now without any medications and will continue to monitor  Lipids: LDL well controlled  He will contact a primary care physician to have his initial Medicare physical in the next few weeks Hemoccult kit given    There are no Patient Instructions on file for this visit.   Elayne Snare 07/11/2018, 3:28 PM   Note: This office note was prepared with Dragon voice recognition system technology. Any transcriptional errors that result from this process are unintentional.

## 2018-08-17 ENCOUNTER — Other Ambulatory Visit: Payer: Self-pay | Admitting: Endocrinology

## 2018-08-28 ENCOUNTER — Encounter: Payer: Self-pay | Admitting: Family Medicine

## 2018-08-28 ENCOUNTER — Ambulatory Visit (INDEPENDENT_AMBULATORY_CARE_PROVIDER_SITE_OTHER): Payer: Medicare Other | Admitting: Family Medicine

## 2018-08-28 ENCOUNTER — Other Ambulatory Visit: Payer: Self-pay

## 2018-08-28 VITALS — BP 126/80 | HR 75 | Temp 98.1°F | Ht 69.0 in | Wt 171.8 lb

## 2018-08-28 DIAGNOSIS — R0609 Other forms of dyspnea: Secondary | ICD-10-CM

## 2018-08-28 DIAGNOSIS — Z23 Encounter for immunization: Secondary | ICD-10-CM

## 2018-08-28 DIAGNOSIS — Z136 Encounter for screening for cardiovascular disorders: Secondary | ICD-10-CM | POA: Diagnosis not present

## 2018-08-28 DIAGNOSIS — R195 Other fecal abnormalities: Secondary | ICD-10-CM | POA: Diagnosis not present

## 2018-08-28 DIAGNOSIS — R6889 Other general symptoms and signs: Secondary | ICD-10-CM

## 2018-08-28 DIAGNOSIS — E1165 Type 2 diabetes mellitus with hyperglycemia: Secondary | ICD-10-CM

## 2018-08-28 DIAGNOSIS — R400 Somnolence: Secondary | ICD-10-CM | POA: Diagnosis not present

## 2018-08-28 DIAGNOSIS — Z125 Encounter for screening for malignant neoplasm of prostate: Secondary | ICD-10-CM

## 2018-08-28 DIAGNOSIS — Z0000 Encounter for general adult medical examination without abnormal findings: Secondary | ICD-10-CM

## 2018-08-28 DIAGNOSIS — R0683 Snoring: Secondary | ICD-10-CM

## 2018-08-28 NOTE — Patient Instructions (Addendum)
Ask your pharmacist about Shingrix - that can be given at your pharmacy.   Continue exercise - most days per week, but low intensity such as walking until evaluated by cardiology.  If any new or worsening hearing, follow up with ear specialist.   I will check thyroid test, but cold nature may be related to age at times, and possibly related to diabetes as well.   If loose stools do not improve with change in caffeine intake, I would consider change in metformin.  Let me or your endocrinologist know if that continues, or I can see you in follow up as well if those symptoms continue.   I will refer you to sleep specialist to discuss sleep apnea testing, and cardiologist to decide if stress testing needed.  Return to the clinic or go to the nearest emergency room if any of your symptoms worsen or new symptoms occur.   Preventive Care 65 Years and Older, Male Preventive care refers to lifestyle choices and visits with your health care provider that can promote health and wellness. What does preventive care include?  A yearly physical exam. This is also called an annual well check.  Dental exams once or twice a year.  Routine eye exams. Ask your health care provider how often you should have your eyes checked.  Personal lifestyle choices, including: ? Daily care of your teeth and gums. ? Regular physical activity. ? Eating a healthy diet. ? Avoiding tobacco and drug use. ? Limiting alcohol use. ? Practicing safe sex. ? Taking low doses of aspirin every day. ? Taking vitamin and mineral supplements as recommended by your health care provider. What happens during an annual well check? The services and screenings done by your health care provider during your annual well check will depend on your age, overall health, lifestyle risk factors, and family history of disease. Counseling Your health care provider may ask you questions about your:  Alcohol use.  Tobacco use.  Drug  use.  Emotional well-being.  Home and relationship well-being.  Sexual activity.  Eating habits.  History of falls.  Memory and ability to understand (cognition).  Work and work Statistician.  Screening You may have the following tests or measurements:  Height, weight, and BMI.  Blood pressure.  Lipid and cholesterol levels. These may be checked every 5 years, or more frequently if you are over 65 years old.  Skin check.  Lung cancer screening. You may have this screening every year starting at age 65 if you have a 30-pack-year history of smoking and currently smoke or have quit within the past 15 years.  Fecal occult blood test (FOBT) of the stool. You may have this test every year starting at age 65.  Flexible sigmoidoscopy or colonoscopy. You may have a sigmoidoscopy every 5 years or a colonoscopy every 10 years starting at age 65.  Prostate cancer screening. Recommendations will vary depending on your family history and other risks.  Hepatitis C blood test.  Hepatitis B blood test.  Sexually transmitted disease (STD) testing.  Diabetes screening. This is done by checking your blood sugar (glucose) after you have not eaten for a while (fasting). You may have this done every 1-3 years.  Abdominal aortic aneurysm (AAA) screening. You may need this if you are a current or former smoker.  Osteoporosis. You may be screened starting at age 65 if you are at high risk.  Talk with your health care provider about your test results, treatment options, and if  necessary, the need for more tests. Vaccines Your health care provider may recommend certain vaccines, such as:  Influenza vaccine. This is recommended every year.  Tetanus, diphtheria, and acellular pertussis (Tdap, Td) vaccine. You may need a Td booster every 10 years.  Varicella vaccine. You may need this if you have not been vaccinated.  Zoster vaccine. You may need this after age 65.  Measles, mumps, and  rubella (MMR) vaccine. You may need at least one dose of MMR if you were born in 1957 or later. You may also need a second dose.  Pneumococcal 13-valent conjugate (PCV13) vaccine. One dose is recommended after age 65.  Pneumococcal polysaccharide (PPSV23) vaccine. One dose is recommended after age 65.  Meningococcal vaccine. You may need this if you have certain conditions.  Hepatitis A vaccine. You may need this if you have certain conditions or if you travel or work in places where you may be exposed to hepatitis A.  Hepatitis B vaccine. You may need this if you have certain conditions or if you travel or work in places where you may be exposed to hepatitis B.  Haemophilus influenzae type b (Hib) vaccine. You may need this if you have certain risk factors.  Talk to your health care provider about which screenings and vaccines you need and how often you need them. This information is not intended to replace advice given to you by your health care provider. Make sure you discuss any questions you have with your health care provider. Document Released: 11/26/2015 Document Revised: 07/19/2016 Document Reviewed: 08/31/2015 Elsevier Interactive Patient Education  [employer].    If you have lab work done today you will be contacted with your lab results within the next 2 weeks.  If you have not heard from [place] then please contact us. The fastest way to get your results is to register for My Chart.   IF you received an x-ray today, you will receive an invoice from [facility]. Please contact [facility] at [phone] with questions or concerns regarding your invoice.   IF you received labwork today, you will receive an invoice from [facility]. Please contact [facility] at [phone] with questions or concerns regarding your invoice.   Our billing staff will not be able to assist you with questions regarding bills from these companies.  You will be contacted with the  lab results as soon as they are available. The fastest way to get your results is to activate your My Chart account. Instructions are located on the last page of this paperwork. If you have not heard from [place] regarding the results in 2 weeks, please contact this office.

## 2018-08-28 NOTE — Progress Notes (Signed)
Subjective:  By signing my name below, I, Joseph Sharp, attest that this documentation has been prepared under the direction and in the presence of Joseph Agreste, MD Electronically Signed: Ladene Artist, ED Scribe 08/28/2018 at 3:18 PM.   Patient ID: Joseph Sharp, male    DOB: Sep 04, 1951, 67 y.o.   MRN: [id number]  Chief Complaint  Patient presents with  . Clementeen Hoof wellness   HPI Joseph Sharp "Joseph Sharp" is a 67 y.o. male who presents to Primary Care at [facility] for a medicare wellness visit. New pt. H/o hyperlipidemia, DM, allergic rhinitis.  DM Followed by Dr. Dwyane Dee, last OV 8/29. On multiple meds including ozempic, actos, metformin, glucotrol XL. - Pt does reports sob only with walking up 2 flights of stairs x 1 yr. Denies cp. He has not had a stress test. Reports diagnosis of TIIDM ~22 yrs ago.  Hyperlipidemia Lab Results  Component Value Date   CHOL 132 07/09/2018   HDL 34.30 (L) 07/09/2018   LDLCALC 77 07/09/2018   TRIG 104.0 07/09/2018   CHOLHDL 4 07/09/2018   Lab Results  Component Value Date   ALT 12 07/09/2018   AST 13 07/09/2018   ALKPHOS 56 07/09/2018   BILITOT 0.4 07/09/2018   Allergies Flonase nasal spray.  ED Prescribed Cialis from endo.  CA Screening Colonoscopy: 09/17/10, normal, rpt 10 yrs Prostate CA Screening: pt agrees to PSA and DRE at this time No results found for: PSA  Immunizations Immunization History  Administered Date(s) Administered  . Influenza, High Dose Seasonal PF 08/28/2018  . Influenza,inj,Quad PF,6+ Mos 09/04/2014, 08/18/2015, 08/08/2016, 10/12/2017  . Pneumococcal Conjugate-13 04/26/2016  . Zoster 09/04/2014  Shingrix: sent to pharmacy Pneumovax: today  Depression Screening Depression screen [facility] 2/9 08/28/2018 03/08/2018 02/21/2018 01/03/2018 06/19/2017  Decreased Interest 0 0 0 0 0  Down, Depressed, Hopeless 0 0 0 0 0  PHQ - 2 Score 0 0 0 0 0   Functional Status Survey: Is the patient deaf or have difficulty  hearing?: Yes(alittle bit) Does the patient have difficulty seeing, even when wearing glasses/contacts?: No Does the patient have difficulty concentrating, remembering, or making decisions?: No Does the patient have difficulty walking or climbing stairs?: No Does the patient have difficulty dressing or bathing?: No Does the patient have difficulty doing errands alone such as visiting a doctor's office or shopping?: No Hearing: reports ear infection in May treated with 2 different antibiotics; reported residual decreased hearing. He was seen by ENT 2 months ago, high frequency hearing loss noted on hearing test, no hearing aids were recommended.  Mental Status Screening 6CIT Screen 08/28/2018  What Year? 0 points  What month? 0 points  What time? 0 points  Count back from 20 0 points  Months in reverse 0 points  Repeat phrase 0 points  Total Score 0     Visual Acuity Screening   Right eye Left eye Both eyes  Without correction:     With correction: 20/20-2 20/20 20/20   Vision: annually, last OV in Dec Dentist: every 6 months Exercise: treadmill and stationary bike ~45 mins 3 days/wk  Advanced Directives Does not have; paperwork was provided. - States him and his wife got a will done 10+ years ago but he isn't sure what it covered.  Cold Intolerance Pt reports cold intolerance x 10 yrs since diagnosis of TIIDM. States he often has to take a hot shower to warm up. Denies changes in weight, skin, hair.  Diarrhea Pt reports that he  has always had loose stool, worsened diarrhea over the past few months. Suspects symptoms may be attributed to drinking iced tea or coffee which he plans to eliminate for the next 2 wks. Denies changes in meds; states he has been on max dose Metformin for a while. Denies blood in stool, nausea, vomiting, fever, chills.  Poss Sleep Apnea Pt states that his wife reports snoring most nights. He reports occasional dry mouth upon waking in the morning from  sleeping with his mouth open. Reports daytime somnolence and often naps during the day. He has never had a sleep study.  Patient Active Problem List   Diagnosis Date Noted  . ED (erectile dysfunction) of organic origin 09/04/2014  . Allergic rhinitis 08/10/2014  . Diabetes mellitus, stable ([place]) 06/17/2013  . Pure hypercholesterolemia 06/17/2013   Past Medical History:  Diagnosis Date  . Allergy   . Diabetes mellitus without complication [facility])    Past Surgical History:  Procedure Laterality Date  . EYE SURGERY  01/2013   Eyelid surgery   No Known Allergies Prior to Admission medications   Medication Sig Start Date End Date Taking? Authorizing Provider  cholecalciferol (VITAMIN D) 1000 UNITS tablet Take 1,000 Units by mouth daily.   Yes [provider]  EPIPEN 2-PAK 0.3 MG/0.3ML SOAJ injection  07/29/13  Yes [provider]  fluticasone (FLONASE) 50 MCG/ACT nasal spray  07/29/13  Yes [provider]  glipiZIDE (GLUCOTROL XL) 2.5 MG 24 hr tablet TAKE 1 TABLET BY MOUTH EVERY DAY WITH BREAKFAST 08/19/18  Yes Elayne Snare, MD  metFORMIN (GLUCOPHAGE-XR) 500 MG 24 hr tablet TAKE 4 TABLETS BY MOUTH EVERY DAY 06/26/18  Yes Elayne Snare, MD  OZEMPIC 0.25 or 0.5 MG/DOSE SOPN INJECT 0.5 MG INTO THE SKIN ONCE A WEEK. 06/26/18  Yes Elayne Snare, MD  pioglitazone (ACTOS) 30 MG tablet TAKE 1 TABLET BY MOUTH EVERY DAY 06/24/18  Yes Elayne Snare, MD  Semaglutide (OZEMPIC) 0.25 or 0.5 MG/DOSE SOPN Inject 0.5 mg into the skin once a week. 04/10/18  Yes Elayne Snare, MD  tadalafil (CIALIS) 20 MG tablet Take 1 tablet (20 mg total) by mouth daily as needed for erectile dysfunction. 08/06/17  Yes Elayne Snare, MD   Social History   Socioeconomic History  . Marital status: Married    Spouse name: Joseph Sharp  . Number of children: 1  . Years of education: Not on file  . Highest education level: Not on file  Occupational History  . Occupation: Engineer, production    Comment: [employer]    Social Needs  . Financial resource strain: Not on file  . Food insecurity:    Worry: Not on file    Inability: Not on file  . Transportation needs:    Medical: Not on file    Non-medical: Not on file  Tobacco Use  . Smoking status: Never Smoker  . Smokeless tobacco: Never Used  Substance and Sexual Activity  . Alcohol use: No  . Drug use: No  . Sexual activity: Yes  Lifestyle  . Physical activity:    Days per week: Not on file    Minutes per session: Not on file  . Stress: Not on file  Relationships  . Social connections:    Talks on phone: Not on file    Gets together: Not on file    Attends religious service: Not on file    Active member of club or organization: Not on file    Attends meetings of clubs or organizations: Not  on file    Relationship status: Not on file  . Intimate partner violence:    Fear of current or ex partner: Not on file    Emotionally abused: Not on file    Physically abused: Not on file    Forced sexual activity: Not on file  Other Topics Concern  . Not on file  Social History Narrative   Lives with his wife.  Their daughter lives in [place], [place].   Review of Systems  Constitutional: Negative for chills, fever and unexpected weight change.  Respiratory: Positive for apnea (possible sleep) and shortness of breath (on exertion).   Cardiovascular: Negative for chest pain.  Gastrointestinal: Positive for diarrhea. Negative for blood in stool, nausea and vomiting.  Endocrine: Positive for cold intolerance.  Skin: Negative for color change.  Psychiatric/Behavioral: Positive for sleep disturbance.      Objective:   Physical Exam  Constitutional: He is oriented to person, place, and time. He appears well-developed and well-nourished.  HENT:  Head: Normocephalic and atraumatic.  Right Ear: External ear normal.  Left Ear: External ear normal.  Mouth/Throat: Oropharynx is clear and moist.  Eyes: Pupils are equal, round, and reactive to light.  Conjunctivae and EOM are normal.  Neck: Normal range of motion. Neck supple. No thyromegaly present.  Cardiovascular: Normal rate, regular rhythm, normal heart sounds and intact distal pulses.  Pulmonary/Chest: Effort normal and breath sounds normal. No respiratory distress. He has no wheezes.  Abdominal: Soft. He exhibits no distension. There is no tenderness. Hernia confirmed negative in the right inguinal area and confirmed negative in the left inguinal area.  Genitourinary: Prostate normal.  Musculoskeletal: Normal range of motion. He exhibits no edema or tenderness.  Lymphadenopathy:    He has no cervical adenopathy.  Neurological: He is alert and oriented to person, place, and time. He has normal reflexes.  Skin: Skin is warm and dry.  Psychiatric: He has a normal mood and affect. His behavior is normal.  Vitals reviewed.    Vitals:   08/28/18 1415  BP: 126/80  Pulse: 75  Temp: 98.1 F (36.7 C)  TempSrc: Oral  SpO2: 98%  Weight: 171 lb 12.8 oz (77.9 kg)  Height: _0  (1.753 m)   EKG reading done by Joseph Agreste, MD: sinus rhythm rate 68. LAFV but no apparent acute findings. No prior EKG available for comparison.    Assessment & Plan:   ISLAM EICHINGER is a 67 y.o. male Welcome to Medicare preventive visit  -  - anticipatory guidance as below in AVS, screening labs if needed. Health maintenance items as above in HPI discussed/recommended as applicable.   - no concerning responses on depression, fall, or functional status screening. Any positive responses noted as above. Advanced directives discussed as in CHL.   Need for influenza vaccination - Plan: Flu vaccine HIGH DOSE PF (Fluzone High dose)  Special screening for malignant neoplasm of prostate - Plan: PSA  - We discussed pros and cons of prostate cancer screening, and after this discussion, he chose to have screening done. PSA obtained, and no concerning findings on DRE.   Need for prophylactic vaccination  against Streptococcus pneumoniae (pneumococcus) - Plan: Pneumococcal polysaccharide vaccine 23-valent greater than or equal to 2yo subcutaneous/IM given  Cold intolerance - Plan: TSH  -Initially screen for thyroid dysfunction, RTC precautions if persistent/worsening.  May be related to age as well.  Loose stools  -Decrease caffeine may help, would consider metformin as possible cause if persistent.  Can discuss with endocrinologist if persistent, or GI evaluation if needed.  Type 2 diabetes mellitus with hyperglycemia, without long-term current use of insulin ([place]) - Plan: EKG 12-Lead Screening for cardiovascular condition - Plan: EKG 12-Lead, Ambulatory referral to Cardiology DOE (dyspnea on exertion) - Plan: EKG 12-Lead, Ambulatory referral to Cardiology  -No concerning findings on EKG, with history of diabetes will refer to cardiology to decide on further testing.  Low intensity exercise with walking only for now.  Snoring - Plan: Ambulatory referral to Sleep Studies Daytime somnolence - Plan: Ambulatory referral to Sleep Studies  -Refer to sleep specialist to evaluate for possible OSA.  No orders of the defined types were placed in this encounter.  Patient Instructions    Ask your pharmacist about Shingrix - that can be given at your pharmacy.   Continue exercise - most days per week, but low intensity such as walking until evaluated by cardiology.  If any new or worsening hearing, follow up with ear specialist.   I will check thyroid test, but cold nature may be related to age at times, and possibly related to diabetes as well.   If loose stools do not improve with change in caffeine intake, I would consider change in metformin.  Let me or your endocrinologist know if that continues, or I can see you in follow up as well if those symptoms continue.   I will refer you to sleep specialist to discuss sleep apnea testing, and cardiologist to decide if stress testing needed.  Return  to the clinic or go to the nearest emergency room if any of your symptoms worsen or new symptoms occur.   Preventive Care 45 Years and Older, Male Preventive care refers to lifestyle choices and visits with your health care provider that can promote health and wellness. What does preventive care include?  A yearly physical exam. This is also called an annual well check.  Dental exams once or twice a year.  Routine eye exams. Ask your health care provider how often you should have your eyes checked.  Personal lifestyle choices, including: ? Daily care of your teeth and gums. ? Regular physical activity. ? Eating a healthy diet. ? Avoiding tobacco and drug use. ? Limiting alcohol use. ? Practicing safe sex. ? Taking low doses of aspirin every day. ? Taking vitamin and mineral supplements as recommended by your health care provider. What happens during an annual well check? The services and screenings done by your health care provider during your annual well check will depend on your age, overall health, lifestyle risk factors, and family history of disease. Counseling Your health care provider may ask you questions about your:  Alcohol use.  Tobacco use.  Drug use.  Emotional well-being.  Home and relationship well-being.  Sexual activity.  Eating habits.  History of falls.  Memory and ability to understand (cognition).  Work and work Statistician.  Screening You may have the following tests or measurements:  Height, weight, and BMI.  Blood pressure.  Lipid and cholesterol levels. These may be checked every 5 years, or more frequently if you are over 59 years old.  Skin check.  Lung cancer screening. You may have this screening every year starting at age 47 if you have a 30-pack-year history of smoking and currently smoke or have quit within the past 15 years.  Fecal occult blood test (FOBT) of the stool. You may have this test every year starting at age  48.  Flexible sigmoidoscopy or  colonoscopy. You may have a sigmoidoscopy every 5 years or a colonoscopy every 10 years starting at age 49.  Prostate cancer screening. Recommendations will vary depending on your family history and other risks.  Hepatitis C blood test.  Hepatitis B blood test.  Sexually transmitted disease (STD) testing.  Diabetes screening. This is done by checking your blood sugar (glucose) after you have not eaten for a while (fasting). You may have this done every 1-3 years.  Abdominal aortic aneurysm (AAA) screening. You may need this if you are a current or former smoker.  Osteoporosis. You may be screened starting at age 44 if you are at high risk.  Talk with your health care provider about your test results, treatment options, and if necessary, the need for more tests. Vaccines Your health care provider may recommend certain vaccines, such as:  Influenza vaccine. This is recommended every year.  Tetanus, diphtheria, and acellular pertussis (Tdap, Td) vaccine. You may need a Td booster every 10 years.  Varicella vaccine. You may need this if you have not been vaccinated.  Zoster vaccine. You may need this after age 36.  Measles, mumps, and rubella (MMR) vaccine. You may need at least one dose of MMR if you were born in 1957 or later. You may also need a second dose.  Pneumococcal 13-valent conjugate (PCV13) vaccine. One dose is recommended after age 21.  Pneumococcal polysaccharide (PPSV23) vaccine. One dose is recommended after age 50.  Meningococcal vaccine. You may need this if you have certain conditions.  Hepatitis A vaccine. You may need this if you have certain conditions or if you travel or work in places where you may be exposed to hepatitis A.  Hepatitis B vaccine. You may need this if you have certain conditions or if you travel or work in places where you may be exposed to hepatitis B.  Haemophilus influenzae type b (Hib) vaccine. You may  need this if you have certain risk factors.  Talk to your health care provider about which screenings and vaccines you need and how often you need them. This information is not intended to replace advice given to you by your health care provider. Make sure you discuss any questions you have with your health care provider. Document Released: 11/26/2015 Document Revised: 07/19/2016 Document Reviewed: 08/31/2015 Elsevier Interactive Patient Education  [employer].    If you have lab work done today you will be contacted with your lab results within the next 2 weeks.  If you have not heard from [place] then please contact us. The fastest way to get your results is to register for My Chart.   IF you received an x-ray today, you will receive an invoice from [facility]. Please contact [facility] at [phone] with questions or concerns regarding your invoice.   IF you received labwork today, you will receive an invoice from [facility]. Please contact [facility] at [phone] with questions or concerns regarding your invoice.   Our billing staff will not be able to assist you with questions regarding bills from these companies.  You will be contacted with the lab results as soon as they are available. The fastest way to get your results is to activate your My Chart account. Instructions are located on the last page of this paperwork. If you have not heard from [place] regarding the results in 2 weeks, please contact this office.       I personally performed the services described in this documentation, which was scribed  in my presence. The recorded information has been reviewed and considered for accuracy and completeness, addended by me as needed, and agree with information above.  Signed,   Merri Ray, MD Primary [facility] at [place].  08/31/18 12:50 PM

## 2018-08-29 LAB — PSA: PROSTATE SPECIFIC AG, SERUM: 0.5 ng/mL (ref 0.0–4.0)

## 2018-08-29 LAB — TSH: TSH: 2.84 u[IU]/mL (ref 0.450–4.500)

## 2018-09-04 ENCOUNTER — Encounter: Payer: Self-pay | Admitting: Interventional Cardiology

## 2018-09-04 ENCOUNTER — Encounter: Payer: Self-pay | Admitting: *Deleted

## 2018-09-04 ENCOUNTER — Ambulatory Visit (INDEPENDENT_AMBULATORY_CARE_PROVIDER_SITE_OTHER): Payer: Medicare Other | Admitting: Interventional Cardiology

## 2018-09-04 VITALS — BP 124/72 | HR 82 | Ht 69.0 in | Wt 173.4 lb

## 2018-09-04 DIAGNOSIS — R0609 Other forms of dyspnea: Secondary | ICD-10-CM | POA: Diagnosis not present

## 2018-09-04 DIAGNOSIS — E118 Type 2 diabetes mellitus with unspecified complications: Secondary | ICD-10-CM | POA: Diagnosis not present

## 2018-09-04 DIAGNOSIS — E785 Hyperlipidemia, unspecified: Secondary | ICD-10-CM

## 2018-09-04 DIAGNOSIS — Z794 Long term (current) use of insulin: Secondary | ICD-10-CM | POA: Diagnosis not present

## 2018-09-04 NOTE — Progress Notes (Signed)
Cardiology Office Note:    Date:  09/04/2018   ID:  Joseph Sharp, DOB 1951-03-23, MRN [id number]  PCP:  [name], MD  Cardiologist:  No primary care provider on file.   Referring MD: [name], MD   Chief Complaint  Patient presents with  . Shortness of Breath    History of Present Illness:    Joseph Sharp is a 67 y.o. male with a hx of diabetes mellitus type 2 who is referred for cardiology consultation by Dr. Merri Ray because of dyspnea on exertion and concern about the appearance of the patient's EKG.  He has a 22-year history of diabetes.  He gets some exercise but not as much as he used to.  He works 20 hours/week and a job that is quite sedentary.  He mentioned to Dr. Nyoka Cowden that if he climbs a flight of stairs he gets excessively short of breath.  There is no chest pain or other symptoms associated.  He denies palpitations and has not had syncope.  He does not have orthopnea or lower extremity swelling.  No prior history of heart disease.  No prior heart evaluation.  He has never smoked.  No history of heart disease and first-line relatives.  A paternal uncle and grandfather had coronary disease.  Past Medical History:  Diagnosis Date  . Allergy   . Diabetes mellitus without complication [facility])     Past Surgical History:  Procedure Laterality Date  . EYE SURGERY  01/2013   Eyelid surgery    Current Medications: Current Meds  Medication Sig  . cholecalciferol (VITAMIN D) 1000 UNITS tablet Take 1,000 Units by mouth daily.  Marland Kitchen EPIPEN 2-PAK 0.3 MG/0.3ML SOAJ injection Inject 0.3 mg into the muscle once as needed (anaphylaxis).   . fluticasone (FLONASE) 50 MCG/ACT nasal spray Place 1 spray into both nostrils daily.   Marland Kitchen glipiZIDE (GLUCOTROL XL) 2.5 MG 24 hr tablet TAKE 1 TABLET BY MOUTH EVERY DAY WITH BREAKFAST  . metFORMIN (GLUCOPHAGE-XR) 500 MG 24 hr tablet TAKE 4 TABLETS BY MOUTH EVERY DAY  . OZEMPIC 0.25 or 0.5 MG/DOSE SOPN INJECT 0.5  MG INTO THE SKIN ONCE A WEEK.  . pioglitazone (ACTOS) 30 MG tablet TAKE 1 TABLET BY MOUTH EVERY DAY  . tadalafil (CIALIS) 20 MG tablet Take 1 tablet (20 mg total) by mouth daily as needed for erectile dysfunction.     Allergies:   Patient has no known allergies.   Social History   Socioeconomic History  . Marital status: Married    Spouse name: [name]  . Number of children: 1  . Years of education: Not on file  . Highest education level: Not on file  Occupational History  . Occupation: Engineer, production    Comment: [employer]  Social Needs  . Financial resource strain: Not on file  . Food insecurity:    Worry: Not on file    Inability: Not on file  . Transportation needs:    Medical: Not on file    Non-medical: Not on file  Tobacco Use  . Smoking status: Never Smoker  . Smokeless tobacco: Never Used  Substance and Sexual Activity  . Alcohol use: No  . Drug use: No  . Sexual activity: Yes  Lifestyle  . Physical activity:    Days per week: Not on file    Minutes per session: Not on file  . Stress: Not on file  Relationships  . Social connections:    Talks on phone: Not  on file    Gets together: Not on file    Attends religious service: Not on file    Active member of club or organization: Not on file    Attends meetings of clubs or organizations: Not on file    Relationship status: Not on file  Other Topics Concern  . Not on file  Social History Narrative   Lives with his wife.  Their daughter lives in [place], [place].     Family History: The patient's family history includes Arthritis in his mother; Diabetes in his brother and mother. There is no history of Cancer or Heart disease.  ROS:   Please see the history of present illness.    No claudication, neurological symptoms, edema, or other complaints.  All other systems reviewed and are negative.  EKGs/Labs/Other Studies Reviewed:    The following studies were reviewed today: No new data  EKG:  EKG is  not ordered today.  The ekg performed on 08/28/2018 reveals sinus rhythm, left axis deviation with slight increase and QRS duration and likely left anterior hemiblock, and there are no prior EKGs to compare.  Recent Labs: 07/09/2018: ALT 12; BUN 23; Creatinine, Ser 1.11; Hemoglobin 14.0; Platelets 280.0; Potassium 4.3; Sodium 141 08/28/2018: TSH 2.840  Recent Lipid Panel    Component Value Date/Time   CHOL 132 07/09/2018 0759   TRIG 104.0 07/09/2018 0759   HDL 34.30 (L) 07/09/2018 0759   CHOLHDL 4 07/09/2018 0759   VLDL 20.8 07/09/2018 0759   LDLCALC 77 07/09/2018 0759    Physical Exam:    VS:  BP 124/72   Pulse 82   Ht 5\' 9"  (1.753 m)   Wt 173 lb 6.4 oz (78.7 kg)   BMI 25.61 kg/m     Wt Readings from Last 3 Encounters:  09/04/18 173 lb 6.4 oz (78.7 kg)  08/28/18 171 lb 12.8 oz (77.9 kg)  07/11/18 172 lb (78 kg)     GEN: Slender.  Well nourished, well developed in no acute distress HEENT: Normal NECK: No JVD. LYMPHATICS: No lymphadenopathy CARDIAC: RRR, no murmur, no gallop, no edema. VASCULAR: 2+ bilateral radial and posterior tibial pulses.  No bruits. RESPIRATORY:  Clear to auscultation without rales, wheezing or rhonchi  ABDOMEN: Soft, non-tender, non-distended, No pulsatile mass, MUSCULOSKELETAL: No deformity  SKIN: Warm and dry NEUROLOGIC:  Alert and oriented x 3 PSYCHIATRIC:  Normal affect   ASSESSMENT:    1. Dyspnea on exertion   2. Type 2 diabetes mellitus with complication, with long-term current use of insulin ([place])   3. Hyperlipidemia with target LDL less than 70    PLAN:    In order of problems listed above:  1. Possibly an anginal equivalent.  Possibly related to diastolic dysfunction in the setting of long-standing diabetes.  A stress myocardial perfusion study will be performed to exclude high risk subset.  Already on aggressive risk factor reduction including attention to glycemic control with most recent A1c of 7.0, LDL was 77, blood pressure is  controlled less than 130/80 mmHg, but he is falling short on aerobic activity. 2. We discussed increasing aerobic activity.  He admits that he could do a better job.  He goes to the gym 2 or 3 times a week and gets him between 45 and 60 minutes of moderate aerobic activity.  He then admits that he has not been now in 3 to 4 weeks. 3. Consider statin therapy for target LDL lowering less than 70.  To exclude the  possibility of silent ischemia/dyspnea as an anginal equivalent, he will undergo a stress pharmacologic myocardial perfusion study.  This will also provide information about LV function.  He should walk on the treadmill rather than having a pharmacologic study.  Significant time was spent in reconsidering secondary risk prevention targets.  He will try to improve/increase exercise time per week to greater than 150 minutes.  Greater than 50% of the time during this office visit was spent in education, counseling, and coordination of care related to underlying disease process and testing as outlined.    Medication Adjustments/Labs and Tests Ordered: Current medicines are reviewed at length with the patient today.  Concerns regarding medicines are outlined above.  Orders Placed This Encounter  Procedures  . Myocardial Perfusion Imaging   No orders of the defined types were placed in this encounter.   Patient Instructions  Your physician recommends that you continue on your current medications as directed. Please refer to the Current Medication list given to you today.  Your physician has requested that you have en exercise stress myoview. For further information please visit [email or web address]. Please follow instruction sheet, as given.   Your physician recommends that you schedule a follow-up appointment in:  AS NEEDED    Signed, Sinclair Grooms, MD  09/04/2018 5:00 PM    [place]

## 2018-09-04 NOTE — Patient Instructions (Signed)
Your physician recommends that you continue on your current medications as directed. Please refer to the Current Medication list given to you today.  Your physician has requested that you have en exercise stress myoview. For further information please visit [email or web address]. Please follow instruction sheet, as given.   Your physician recommends that you schedule a follow-up appointment in:  AS NEEDED

## 2018-09-05 ENCOUNTER — Telehealth (HOSPITAL_COMMUNITY): Payer: Self-pay | Admitting: *Deleted

## 2018-09-05 NOTE — Telephone Encounter (Signed)
Left message on voicemail per DPR in reference to upcoming appointment scheduled on 09/10/18 with detailed instructions given per Myocardial Perfusion Study Information Sheet for the test. LM to arrive 15 minutes early, and that it is imperative to arrive on time for appointment to keep from having the test rescheduled. If you need to cancel or reschedule your appointment, please call the office within 24 hours of your appointment. Failure to do so may result in a cancellation of your appointment, and a $50 no show fee. Phone number given for call back for any questions. Kirstie Peri

## 2018-09-10 ENCOUNTER — Ambulatory Visit (HOSPITAL_COMMUNITY): Payer: Medicare Other | Attending: Cardiology

## 2018-09-10 ENCOUNTER — Encounter: Payer: Medicare Other | Admitting: *Deleted

## 2018-09-10 VITALS — Ht 69.0 in | Wt 173.0 lb

## 2018-09-10 DIAGNOSIS — E118 Type 2 diabetes mellitus with unspecified complications: Secondary | ICD-10-CM | POA: Diagnosis not present

## 2018-09-10 DIAGNOSIS — Z006 Encounter for examination for normal comparison and control in clinical research program: Secondary | ICD-10-CM

## 2018-09-10 DIAGNOSIS — Z794 Long term (current) use of insulin: Secondary | ICD-10-CM | POA: Insufficient documentation

## 2018-09-10 DIAGNOSIS — R0609 Other forms of dyspnea: Secondary | ICD-10-CM | POA: Insufficient documentation

## 2018-09-10 LAB — MYOCARDIAL PERFUSION IMAGING
CHL CUP MPHR: 155 {beats}/min
CHL CUP NUCLEAR SDS: 0
CHL CUP NUCLEAR SSS: 0
CHL CUP RESTING HR STRESS: 57 {beats}/min
CHL RATE OF PERCEIVED EXERTION: 18
CSEPHR: 96 %
CSEPPHR: 150 {beats}/min
Estimated workload: 8.9 METS
Exercise duration (min): 7 min
Exercise duration (sec): 15 s
LV dias vol: 56 mL (ref 62–150)
LV sys vol: 19 mL
NUC STRESS TID: 0.82
SRS: 0

## 2018-09-10 MED ORDER — TECHNETIUM TC 99M TETROFOSMIN IV KIT
11.0000 | PACK | Freq: Once | INTRAVENOUS | Status: AC | PRN
  Administered 2018-09-10: 11 via INTRAVENOUS
  Filled 2018-09-10: qty 11

## 2018-09-10 MED ORDER — TECHNETIUM TC 99M TETROFOSMIN IV KIT
32.6000 | PACK | Freq: Once | INTRAVENOUS | Status: AC | PRN
  Administered 2018-09-10: 32.6 via INTRAVENOUS
  Filled 2018-09-10: qty 33

## 2018-09-10 NOTE — Research (Signed)
Joseph Sharp met criteria for the CADFEM trial. The trial was explained to him including risk vs. Benefits. He had the opportunity to read the consent and ask questions. The consent was signed and a copy was given to the patient. No study related procedures were performed prior to the informed consent being signed at 710.

## 2018-09-15 ENCOUNTER — Other Ambulatory Visit: Payer: Self-pay | Admitting: Endocrinology

## 2018-09-20 ENCOUNTER — Other Ambulatory Visit: Payer: Self-pay | Admitting: Endocrinology

## 2018-10-17 ENCOUNTER — Telehealth: Payer: Self-pay | Admitting: Neurology

## 2018-10-17 ENCOUNTER — Institutional Professional Consult (permissible substitution): Payer: Medicare Other | Admitting: Neurology

## 2018-10-17 NOTE — Telephone Encounter (Signed)
Called the patient and made him aware that we had a internet concern and was unable to get on computers. Informed we would need to reschedule the apt. Pt verbalized understanding. Asked for [place] to call back and reschedule

## 2018-10-22 ENCOUNTER — Encounter: Payer: Self-pay | Admitting: Neurology

## 2018-10-22 ENCOUNTER — Ambulatory Visit (INDEPENDENT_AMBULATORY_CARE_PROVIDER_SITE_OTHER): Payer: Medicare Other | Admitting: Neurology

## 2018-10-22 VITALS — BP 121/73 | HR 76 | Ht 69.0 in | Wt 174.0 lb

## 2018-10-22 DIAGNOSIS — G479 Sleep disorder, unspecified: Secondary | ICD-10-CM | POA: Diagnosis not present

## 2018-10-22 DIAGNOSIS — G4719 Other hypersomnia: Secondary | ICD-10-CM

## 2018-10-22 DIAGNOSIS — R0683 Snoring: Secondary | ICD-10-CM

## 2018-10-22 DIAGNOSIS — G4700 Insomnia, unspecified: Secondary | ICD-10-CM

## 2018-10-22 DIAGNOSIS — E663 Overweight: Secondary | ICD-10-CM | POA: Diagnosis not present

## 2018-10-22 NOTE — Patient Instructions (Signed)

## 2018-10-22 NOTE — Progress Notes (Signed)
Subjective:    Patient ID: Joseph Sharp is a 67 y.o. male.  HPI     Star Age, MD, PhD [facility] [address], [address] [address] [place], [place] [address]  Dear Dr. Carlota Raspberry,   I saw your patient, Patricio Popwell, upon your kind request, in my sleep clinic today for initial consultation of his sleep disorder, in particular, concern for underlying obstructive sleep apnea. The patient is unaccompanied today. As you know, Mr. Laurich is a 67 year old right-handed gentleman with an underlying medical history of diabetes, hyperlipidemia, allergic rhinitis and borderline overweight state, who reports snoring and excessive daytime somnolence. He has had daytime somnolence for the past year or so and also difficulty maintaining sleep for the past year or so. He wakes up in the early morning and has difficulty going back to sleep. He tries to be in bed around 11 consistently, his wife has sleep apnea and sleeps in a recliner. He falls asleep fairly well, keeps the TV on a 1 hour timer and likes to listen to music before falling asleep for relaxation. He does snore and makes popping sounds but is not sure if he has any apneas. He had a nuclear medicine stress test on 09/10/2018 with benign findings. I reviewed your office note from 08/28/2018. He reported shortness of breath with minimal exertion and was referred to cardiology. His Epworth sleepiness score is 17 out of 24 today, fatigue score is 29 out of 63. He averages about 5 hours of sleep at night he estimates. He is part-time employed at [employer], lives with his wife, has 1 child. He is a nonsmoker, does not utilize alcohol, drinks caffeine in limitation, one cup per day on average. He has not fallen asleep at the wheel. He has had some restless legs type symptoms but is not sure if he twitches his legs while asleep. He feels relief when moving them while in bed before falling asleep. He denies any numbness or  tingling or burning sensation in his feet. Latest A1c was 7.1 but has been around 7.0 for a while. He does endorse dreaming. He does not have night to night nocturia and suspects that his paternal grandfather had obstructive sleep apnea, denies morning headaches. He has one daughter, no grandchildren. He works from 8-12, Monday through Friday.  His Past Medical History Is Significant For: Past Medical History:  Diagnosis Date  . Allergy   . Diabetes mellitus without complication ([place])     His Past Surgical History Is Significant For: Past Surgical History:  Procedure Laterality Date  . EYE SURGERY  01/2013   Eyelid surgery    His Family History Is Significant For: Family History  Problem Relation Age of Onset  . Diabetes Mother   . Arthritis Mother   . Diabetes Brother   . Cancer Neg Hx   . Heart disease Neg Hx     His Social History Is Significant For: Social History   Socioeconomic History  . Marital status: Married    Spouse name: [name]  . Number of children: 1  . Years of education: Not on file  . Highest education level: Not on file  Occupational History  . Occupation: Engineer, production    Comment: [employer]  Social Needs  . Financial resource strain: Not on file  . Food insecurity:    Worry: Not on file    Inability: Not on file  . Transportation needs:    Medical: Not on file    Non-medical:  Not on file  Tobacco Use  . Smoking status: Never Smoker  . Smokeless tobacco: Never Used  Substance and Sexual Activity  . Alcohol use: No  . Drug use: No  . Sexual activity: Yes  Lifestyle  . Physical activity:    Days per week: Not on file    Minutes per session: Not on file  . Stress: Not on file  Relationships  . Social connections:    Talks on phone: Not on file    Gets together: Not on file    Attends religious service: Not on file    Active member of club or organization: Not on file    Attends meetings of clubs or organizations: Not on file     Relationship status: Not on file  Other Topics Concern  . Not on file  Social History Narrative   Lives with his wife.  Their daughter lives in [place], [place].    His Allergies Are:  No Known Allergies:   His Current Medications Are:  Outpatient Encounter Medications as of 10/22/2018  Medication Sig  . cholecalciferol (VITAMIN D) 1000 UNITS tablet Take 1,000 Units by mouth daily.  Marland Kitchen EPIPEN 2-PAK 0.3 MG/0.3ML SOAJ injection Inject 0.3 mg into the muscle once as needed (anaphylaxis).   . fluticasone (FLONASE) 50 MCG/ACT nasal spray Place 1 spray into both nostrils daily.   Marland Kitchen glipiZIDE (GLUCOTROL XL) 2.5 MG 24 hr tablet TAKE 1 TABLET BY MOUTH EVERY DAY WITH BREAKFAST  . metFORMIN (GLUCOPHAGE-XR) 500 MG 24 hr tablet TAKE 4 TABLETS BY MOUTH EVERY DAY  . OZEMPIC, 0.25 OR 0.5 MG/DOSE, 2 MG/1.5ML SOPN INJECT 0.5 MG INTO THE SKIN ONCE A WEEK.  . pioglitazone (ACTOS) 30 MG tablet TAKE 1 TABLET BY MOUTH EVERY DAY  . tadalafil (CIALIS) 20 MG tablet Take 1 tablet (20 mg total) by mouth daily as needed for erectile dysfunction.   No facility-administered encounter medications on file as of 10/22/2018.   :  Review of Systems:  Out of a complete 14 point review of systems, all are reviewed and negative with the exception of these symptoms as listed below: Review of Systems  Neurological:       Pt presents today to discuss his sleep. Pt has never had a sleep study but does endorse snoring.  Epworth Sleepiness Scale 0= would never doze 1= slight chance of dozing 2= moderate chance of dozing 3= high chance of dozing  Sitting and reading: 3 Watching TV: 3 Sitting inactive in a public place (ex. Theater or meeting): 2 As a passenger in a car for an hour without a break: 3 Lying down to rest in the afternoon: 3 Sitting and talking to someone: 0 Sitting quietly after lunch (no alcohol): 3 In a car, while stopped in traffic: 3 Total: 17     Objective:  Neurological Exam  Physical  Exam Physical Examination:   Vitals:   10/22/18 1530  BP: 121/73  Pulse: 76    General Examination: The patient is a very pleasant 67 y.o. male in no acute distress. He appears well-developed and well-nourished and well groomed.   HEENT: Normocephalic, atraumatic, pupils are equal, round and reactive to light and accommodation. Some ptosis b/l right more than L. Had blepharoplasty in 2014, he states. Extraocular tracking is good without limitation to gaze excursion or nystagmus noted. Normal smooth pursuit is noted. Hearing is grossly intact. Face is symmetric with normal facial animation and normal facial sensation. Speech is clear with no dysarthria  noted. There is no hypophonia. There is no lip, neck/head, jaw or voice tremor. Neck is supple with full range of passive and active motion. There are no carotid bruits on auscultation. Oropharynx exam reveals: mild mouth dryness, adequate dental hygiene and mild airway crowding, due to longer uvula, tonsils in place. Mallampati is class I. Tongue protrudes centrally and palate elevates symmetrically. Neck size is 16.5 inches. He has a Moderate overbite.  Chest: Clear to auscultation without wheezing, rhonchi or crackles noted.  Heart: S1+S2+0, regular and normal without murmurs, rubs or gallops noted.   Abdomen: Soft, non-tender and non-distended with normal bowel sounds appreciated on auscultation.  Extremities: There is no pitting edema in the distal lower extremities bilaterally.   Skin: Warm and dry without trophic changes noted.  Musculoskeletal: exam reveals no obvious joint deformities, tenderness or joint swelling or erythema.   Neurologically:  Mental status: The patient is awake, alert and oriented in all 4 spheres. His immediate and remote memory, attention, language skills and fund of knowledge are appropriate. There is no evidence of aphasia, agnosia, apraxia or anomia. Speech is clear with normal prosody and enunciation. Thought  process is linear. Mood is normal and affect is normal.  Cranial nerves II - XII are as described above under HEENT exam. In addition: shoulder shrug is normal with equal shoulder height noted. Motor exam: Normal bulk, strength and tone is noted. There is no drift, tremor or rebound. Romberg is negative. Fine motor skills and coordination: grossly intact.  Cerebellar testing: No dysmetria or intention tremor. There is no truncal or gait ataxia.  Sensory exam: intact to light touch in the upper and lower extremities.  Gait, station and balance: He stands easily. No veering to one side is noted. No leaning to one side is noted. Posture is age-appropriate and stance is narrow based. Gait shows normal stride length and normal pace. No problems turning are noted. Tandem walk is unremarkable.  Assessment and Plan:  In summary, GAR GLANCE is a very pleasant 67 y.o.-year old male with a history and physical exam concerning for obstructive sleep apnea (OSA). I had a long chat with the patient about my findings and the diagnosis of OSA, its prognosis and treatment options. We talked about medical treatments, surgical interventions and non-pharmacological approaches. I explained in particular the risks and ramifications of untreated moderate to severe OSA, especially with respect to developing cardiovascular disease down the Road, including congestive heart failure, difficult to treat hypertension, cardiac arrhythmias, or stroke. Even type 2 diabetes has, in part, been linked to untreated OSA. Symptoms of untreated OSA include daytime sleepiness, memory problems, mood irritability and mood disorder such as depression and anxiety, lack of energy, as well as recurrent headaches, especially morning headaches. We talked about trying to maintain a healthy lifestyle in general, as well as the importance of weight control. I encouraged the patient to eat healthy, exercise daily and keep well hydrated, to keep a  scheduled bedtime and wake time routine, to not skip any meals and eat healthy snacks in between meals. I strongly advised the patient not to drive when feeling sleepy. I recommended the following at this time: sleep study with potential positive airway pressure titration. (We will score hypopneas at 4%).   I explained the sleep test procedure to the patient and also outlined possible surgical and non-surgical treatment options of OSA, including the use of a custom-made dental device (which would require a referral to a specialist dentist or oral surgeon),  upper airway surgical options, such as pillar implants, radiofrequency surgery, tongue base surgery, and UPPP (which would involve a referral to an ENT surgeon). Rarely, jaw surgery such as mandibular advancement may be considered.  I also explained the CPAP treatment option to the patient, who indicated that he would be willing to try CPAP if the need arises. I explained the importance of being compliant with PAP treatment, not only for insurance purposes but primarily to improve His symptoms, and for the patient's long term health benefit, including to reduce His cardiovascular risks. I answered all his questions today and the patient was in agreement. I plan to see him back after the sleep study is completed and encouraged him to call with any interim questions, concerns, problems or updates.   Thank you very much for allowing me to participate in the care of this nice patient. If I can be of any further assistance to you please do not hesitate to call me at [phone].  Sincerely,   Star Age, MD, PhD

## 2018-11-08 ENCOUNTER — Other Ambulatory Visit: Payer: Medicare Other

## 2018-11-11 ENCOUNTER — Ambulatory Visit: Payer: Medicare Other | Admitting: Endocrinology

## 2018-11-20 ENCOUNTER — Ambulatory Visit (INDEPENDENT_AMBULATORY_CARE_PROVIDER_SITE_OTHER): Payer: Medicare Other | Admitting: Neurology

## 2018-11-20 DIAGNOSIS — E663 Overweight: Secondary | ICD-10-CM

## 2018-11-20 DIAGNOSIS — G479 Sleep disorder, unspecified: Secondary | ICD-10-CM

## 2018-11-20 DIAGNOSIS — G4710 Hypersomnia, unspecified: Secondary | ICD-10-CM

## 2018-11-20 DIAGNOSIS — G4700 Insomnia, unspecified: Secondary | ICD-10-CM

## 2018-11-20 DIAGNOSIS — G4719 Other hypersomnia: Secondary | ICD-10-CM

## 2018-11-20 DIAGNOSIS — G4720 Circadian rhythm sleep disorder, unspecified type: Secondary | ICD-10-CM

## 2018-11-20 DIAGNOSIS — R0683 Snoring: Secondary | ICD-10-CM

## 2018-11-25 ENCOUNTER — Telehealth: Payer: Self-pay

## 2018-11-25 ENCOUNTER — Other Ambulatory Visit: Payer: Medicare Other

## 2018-11-25 NOTE — Procedures (Signed)
PATIENT'S NAME:  Joseph Sharp, Joseph Sharp DOB:      07-25-1951      MR#:    [id number]     DATE OF RECORDING: 11/20/2018 REFERRING M.D.:  Merri Ray, MD Study Performed:   Baseline Polysomnogram HISTORY: [name] is a 68 year old man with a history of diabetes, hyperlipidemia, allergic rhinitis and borderline overweight state, who reports snoring and excessive daytime somnolence. He has difficulty maintaining sleep. The patient endorsed the Epworth Sleepiness Scale at 17 points. The patient's weight 174 pounds with a height of 69 (inches), resulting in a BMI of 25.8 kg/m2. The patient's neck circumference measured 16.5 inches.  CURRENT MEDICATIONS: Epipen, Vitamin D, Flonase, Glucotrol XL, Glucophage XR, Ozempic, Actos, Cialis   PROCEDURE:  This is a multichannel digital polysomnogram utilizing the Somnostar 11.2 system.  Electrodes and sensors were applied and monitored per AASM Specifications.   EEG, EOG, Chin and Limb EMG, were sampled at 200 Hz.  ECG, Snore and Nasal Pressure, Thermal Airflow, Respiratory Effort, CPAP Flow and Pressure, Oximetry was sampled at 50 Hz. Digital video and audio were recorded.      BASELINE STUDY  Lights Out was at 21:50 and Lights On at 05:00.  Total recording time (TRT) was 430.5 minutes, with a total sleep time (TST) of 394 minutes.   The patient's sleep latency was 4.5 minutes. REM latency was 56.5 minutes, which is mildly reduced. The sleep efficiency was 91.5%.     SLEEP ARCHITECTURE: WASO (Wake after sleep onset) was 32 minutes with mild sleep fragmentation noted. There were 35.5 minutes in Stage N1, 232.5 minutes Stage N2, 75.5 minutes Stage N3 and 50.5 minutes in Stage REM. The percentage of Stage N1 was 9.%, which is increased, Stage N2 was 59.%, which is mildly increased, Stage N3 was 19.2%, and Stage R (REM sleep) was 12.8%, which is reduced. The arousals were noted as: 82 were spontaneous, 6 were associated with PLMs, 0 were associated with respiratory  events.  RESPIRATORY ANALYSIS:  There were a total of 0 respiratory events:  0 obstructive apneas, 0 central apneas and 0 mixed apneas with a total of 0 apneas and an apnea index (AI) of 0 /hour. There were 0 hypopneas with a hypopnea index of 0 /hour. The patient also had 0 respiratory event related arousals (RERAs).      The total APNEA/HYPOPNEA INDEX (AHI) was 0 /hour and the total RESPIRATORY DISTURBANCE INDEX was 0 /hour.  0 events occurred in REM sleep and 0 events in NREM. The REM AHI was 0 /hour, versus a non-REM AHI of 0. The patient spent 0 minutes of total sleep time in the supine position and 394 minutes in non-supine.. The supine AHI was 0.0 versus a non-supine AHI of 0.0.  OXYGEN SATURATION & C02:  The Wake baseline 02 saturation was 94%), with the lowest being91% (78% on the technical report was an error). Time spent below 89% saturation equaled 0 minutes.  PERIODIC LIMB MOVEMENTS: The patient had a total of 12 Periodic Limb Movements.  The Periodic Limb Movement (PLM) index was 1.8 and the PLM Arousal index was .9/hour.   Audio and video analysis did not show any abnormal or unusual movements, behaviors, phonations or vocalizations. The patient took no bathroom breaks. Mild intermittent snoring was noted. The EKG was in keeping with normal sinus rhythm (NSR).   Post-study, the patient indicated that sleep was the same as usual.   IMPRESSION:  1. Primary Snoring 2. Dysfunctions associated with sleep stages or arousal from  sleep  RECOMMENDATIONS:  1. This study does not demonstrate any significant obstructive or central sleep disordered breathing and only mild and intermittent snoring. This study does not support an intrinsic sleep disorder as a cause of the patient's symptoms. Other causes, including circadian rhythm disturbances, an underlying mood disorder, medication effect and/or an underlying medical problem cannot be ruled out. 2. This study shows sleep fragmentation,  frequent spontaneous arousals and abnormal sleep stage percentages; these are nonspecific findings and per se do not signify an intrinsic sleep disorder or a cause for the patient's sleep-related symptoms. Causes include (but are not limited to) the first night effect of the sleep study, circadian rhythm disturbances, medication effect or an underlying mood disorder or medical problem.  3. The patient should be cautioned not to drive, work at heights, or operate dangerous or heavy equipment when tired or sleepy. Review and reiteration of good sleep hygiene measures should be pursued with any patient. 4. The patient will be advised to follow up with the referring provider, who will be notified of the test results. A follow up in sleep clinical will be offered as well.   I certify that I have reviewed the entire raw data recording prior to the issuance of this report in accordance with the Standards of Accreditation of the American Academy of Sleep Medicine (AASM)   Star Age, MD, PhD Diplomat, American Board of Neurology and Sleep Medicine (Neurology and Sleep Medicine)

## 2018-11-25 NOTE — Progress Notes (Signed)
Patient referred by Dr. Carlota Raspberry, seen by me on 10/22/18, diagnostic PSG on 11/20/18.   Please call and notify the patient that the recent sleep study did not show any significant obstructive sleep apnea or sleep disruption or leg movements or snoring. In fact, he sleep fairly well, over 90% of the time tested and achieved all stages of sleep. If okay, he can FU with PCP. If he has bothersome restless legs symptoms, we can consider treatment for this, but study did not show telltale leg movements associated with RLS. He can follow up with me if desired.   Thanks,  Star Age, MD, PhD [facility])

## 2018-11-25 NOTE — Telephone Encounter (Signed)
I called pt to discuss his sleep study results. Pt declined a follow up with Dr. Rexene Alberts for RLS symptoms right now. He will continue following up with Dr. Carlota Raspberry. Pt verbalized understanding of results. Pt had no questions at this time but was encouraged to call back if questions arise.

## 2018-11-25 NOTE — Telephone Encounter (Signed)
-----   Message from Star Age, MD sent at 11/25/2018  8:26 AM EST ----- Patient referred by Dr. Carlota Raspberry, seen by me on 10/22/18, diagnostic PSG on 11/20/18.   Please call and notify the patient that the recent sleep study did not show any significant obstructive sleep apnea or sleep disruption or leg movements or snoring. In fact, he sleep fairly well, over 90% of the time tested and achieved all stages of sleep. If okay, he can FU with PCP. If he has bothersome restless legs symptoms, we can consider treatment for this, but study did not show telltale leg movements associated with RLS. He can follow up with me if desired.   Thanks,  Star Age, MD, PhD [facility])

## 2018-11-27 ENCOUNTER — Ambulatory Visit: Payer: Medicare Other | Admitting: Endocrinology

## 2018-12-10 ENCOUNTER — Other Ambulatory Visit: Payer: Medicare Other

## 2018-12-11 ENCOUNTER — Other Ambulatory Visit (INDEPENDENT_AMBULATORY_CARE_PROVIDER_SITE_OTHER): Payer: Medicare Other

## 2018-12-11 ENCOUNTER — Other Ambulatory Visit: Payer: Self-pay | Admitting: Endocrinology

## 2018-12-11 DIAGNOSIS — Z1211 Encounter for screening for malignant neoplasm of colon: Secondary | ICD-10-CM | POA: Diagnosis not present

## 2018-12-11 LAB — FECAL OCCULT BLOOD, IMMUNOCHEMICAL: FECAL OCCULT BLD: NEGATIVE

## 2018-12-14 ENCOUNTER — Other Ambulatory Visit: Payer: Self-pay | Admitting: Endocrinology

## 2018-12-16 ENCOUNTER — Other Ambulatory Visit (INDEPENDENT_AMBULATORY_CARE_PROVIDER_SITE_OTHER): Payer: Medicare Other

## 2018-12-16 DIAGNOSIS — E1165 Type 2 diabetes mellitus with hyperglycemia: Secondary | ICD-10-CM

## 2018-12-16 DIAGNOSIS — R0683 Snoring: Secondary | ICD-10-CM

## 2018-12-16 DIAGNOSIS — G479 Sleep disorder, unspecified: Secondary | ICD-10-CM

## 2018-12-16 DIAGNOSIS — G4719 Other hypersomnia: Secondary | ICD-10-CM

## 2018-12-16 DIAGNOSIS — G4700 Insomnia, unspecified: Secondary | ICD-10-CM

## 2018-12-16 DIAGNOSIS — E663 Overweight: Secondary | ICD-10-CM

## 2018-12-16 LAB — BASIC METABOLIC PANEL
BUN: 17 mg/dL (ref 6–23)
CO2: 24 mEq/L (ref 19–32)
Calcium: 9.5 mg/dL (ref 8.4–10.5)
Chloride: 109 mEq/L (ref 96–112)
Creatinine, Ser: 0.99 mg/dL (ref 0.40–1.50)
GFR: 75.66 mL/min (ref >60.00)
Glucose, Bld: 185 mg/dL — ABNORMAL HIGH (ref 70–99)
Potassium: 4.5 mEq/L (ref 3.5–5.1)
Sodium: 140 mEq/L (ref 135–145)

## 2018-12-16 LAB — MICROALBUMIN / CREATININE URINE RATIO
Creatinine,U: 138.4 mg/dL
Microalb Creat Ratio: 0.5 mg/g (ref 0.0–30.0)
Microalb, Ur: <0.7 mg/dL (ref 0.0–1.9)

## 2018-12-16 LAB — HEMOGLOBIN A1C: HEMOGLOBIN A1C: 6.8 % — AB (ref 4.6–6.5)

## 2018-12-19 ENCOUNTER — Other Ambulatory Visit: Payer: Self-pay

## 2018-12-19 ENCOUNTER — Ambulatory Visit (INDEPENDENT_AMBULATORY_CARE_PROVIDER_SITE_OTHER): Payer: Medicare Other | Admitting: Endocrinology

## 2018-12-19 ENCOUNTER — Encounter: Payer: Self-pay | Admitting: Endocrinology

## 2018-12-19 VITALS — BP 136/72 | HR 71 | Ht 69.0 in | Wt 175.4 lb

## 2018-12-19 DIAGNOSIS — E1165 Type 2 diabetes mellitus with hyperglycemia: Secondary | ICD-10-CM

## 2018-12-19 DIAGNOSIS — E7800 Pure hypercholesterolemia, unspecified: Secondary | ICD-10-CM

## 2018-12-19 MED ORDER — GLUCOSE BLOOD VI STRP: 1.0000 | ORAL_STRIP | 3 refills | Status: DC | PRN

## 2018-12-19 NOTE — Progress Notes (Signed)
Patient ID: Joseph Sharp, male   DOB: Oct 30, 1951, 68 y.o.   MRN: [id number]   Reason for Appointment: follow-up for diabetes and blood pressure   History of Present Illness    Diagnosis: Type 2 DIABETES MELITUS, diagnosed in 1977     PAST history: He has had long-standing diabetes managed with multiple drugs and usually well controlled His blood sugar control is somewhat dependent on his compliance with diet and exercise regimen Amaryl was stopped in 2014 because of tendency to hypoglycemia and Prandin was started  In 11 /14 his blood sugars were higher overall and A1c was 7% which was higher than usual Because of this he was started on Trulicity injections, 5.78 mg He did benefit overall from starting 4.69 mg Trulicity  RECENT history:  His A1c is now lower at 6.8, previously up to 7.1  Non-insulin hypoglycemic drugs: Glipizide ER 2.5 mg daily , metformin 2g, pioglitazone 30 mg, Ozempic 0.5 mg weekly  Current management, blood sugar patterns and problems identified:  He appears to be doing better with his lifestyle overall in the last few months  He has been going to the gym more regularly, about 3 days a week  Although his weight is about the same his A1c indicates better control  He has before it seems to have high readings fasting and also a dawn phenomenon with his lab glucose 185 fasting  However FASTING blood sugars at home recently have been between 127 and 149  He has been finally using the FreeStyle meter and not the [employer] brand that he had on the last visit  Since he got a new meter he has only checked his blood sugar this week on the current meter and does not have many days of information for download.  He has been regular with Ozempic and no complaints of nausea with this  Highest blood sugar was 225 after breakfast but he usually eats some protein in the morning   Side effects from medications: None  Monitors blood glucose:  1+ times a day      Glucometer:  Freestyle        Blood Glucose readings for the last 3 days from monitor download:    PRE-MEAL Fasting Lunch Dinner Bedtime Overall  Glucose range:  127-149  146  98, 117    Mean/median:         POST-MEAL PC Breakfast PC Lunch PC Dinner  Glucose range:  152-225   164, 168  Mean/median:                Diet:  Has a egg and toast at breakfast with small amount of juice and fruit, usually getting low fat meals,  Dinner usually at 7 pm  EXERCISE: Walks 3/7 at gym    Wt Readings from Last 3 Encounters:  12/19/18 175 lb 6.4 oz (79.6 kg)  10/22/18 174 lb (78.9 kg)  09/10/18 173 lb (78.5 kg)   Lab Results  Component Value Date   HGBA1C 6.8 (H) 12/16/2018   HGBA1C 7.0 (H) 07/09/2018   HGBA1C 7.1 (H) 04/04/2018   Lab Results  Component Value Date   MICROALBUR <0.7 12/16/2018   LDLCALC 77 07/09/2018   CREATININE 0.99 12/16/2018     Allergies as of 12/19/2018   No Known Allergies     Medication List       Accurate as of December 19, 2018  4:33 PM. Always use your most recent med list.  cholecalciferol 1000 units tablet Commonly known as:  VITAMIN D Take 1,000 Units by mouth daily.   EPIPEN 2-PAK 0.3 mg/0.3 mL Soaj injection Generic drug:  EPINEPHrine Inject 0.3 mg into the muscle once as needed (anaphylaxis).   fluticasone 50 MCG/ACT nasal spray Commonly known as:  FLONASE Place 1 spray into both nostrils daily.   glipiZIDE 2.5 MG 24 hr tablet Commonly known as:  GLUCOTROL XL TAKE 1 TABLET BY MOUTH EVERY DAY WITH BREAKFAST   glucose blood test strip 1 each by Other route as needed for other. Use as instructed to check blood sugar once daily.   metFORMIN 500 MG 24 hr tablet Commonly known as:  GLUCOPHAGE-XR TAKE 4 TABLETS BY MOUTH EVERY DAY   OZEMPIC (0.25 OR 0.5 MG/DOSE) 2 MG/1.5ML Sopn Generic drug:  Semaglutide(0.25 or 0.5MG /DOS) INJECT 0.5 MG INTO THE SKIN ONCE A WEEK.   pioglitazone 30 MG tablet Commonly known as:  ACTOS TAKE 1  TABLET BY MOUTH EVERY DAY   tadalafil 20 MG tablet Commonly known as:  CIALIS Take 1 tablet (20 mg total) by mouth daily as needed for erectile dysfunction.       Allergies: No Known Allergies  Past Medical History:  Diagnosis Date  . Allergy   . Diabetes mellitus without complication [facility])     Past Surgical History:  Procedure Laterality Date  . EYE SURGERY  01/2013   Eyelid surgery    Family History  Problem Relation Age of Onset  . Diabetes Mother   . Arthritis Mother   . Diabetes Brother   . Cancer Neg Hx   . Heart disease Neg Hx     Social History:  reports that he has never smoked. He has never used smokeless tobacco. He reports that he does not drink alcohol or use drugs.   Review of Systems    HYPERTENSION: This has been  mild and previously was tried on ramipril and lisinopril He has stopped taking his lisinopril in 2019 since he thought it was making her feel little dizzy again Blood pressure is relatively better with lifestyle changes  Currently not on medications  BP Readings from Last 3 Encounters:  12/19/18 136/72  10/22/18 121/73  09/04/18 124/72      HYPERLIPIDEMIA: The lipid abnormality consists of elevated LDL and triglycerides and low HDL LDL consistently well controlled with lovastatin Has low HDL from metabolic syndrome   Lab Results  Component Value Date   CHOL 132 07/09/2018   HDL 34.30 (L) 07/09/2018   LDLCALC 77 07/09/2018   TRIG 104.0 07/09/2018   CHOLHDL 4 07/09/2018    He previously had transient hypogonadotropic hypogonadism which subsequently had resolved.  Was previously treated with AndroGel.   Lab Results  Component Value Date   TESTOSTERONE 354 07/10/2017    He takes vitamin D 1000 units as a supplement  Lab Results  Component Value Date   VD25OH 46.46 04/03/2017    Lab Results  Component Value Date   OCCULTBLD Negative 12/11/2018   OCCULTBLD Negative 06/01/2016   OCCULTBLD Negative 03/24/2015          Examination:   BP 136/72 (BP Location: Left Arm, Patient Position: Sitting, Cuff Size: Normal)   Pulse 71   Ht 5\' 9"  (1.753 m)   Wt 175 lb 6.4 oz (79.6 kg)   SpO2 97%   BMI 25.90 kg/m   Body mass index is 25.9 kg/m.       Physical Exam   ASSESSMENT/ PLAN:  DIABETES, non-insulin-dependent:   See history of present illness for detailed discussion of current diabetes management, blood sugar patterns and problems identified  His A1c is 6.8 %  He is on multiple drugs including Ozempic and Actos He is now just starting to use the freestyle brand meter which is more accurate However still has relatively high fasting readings and also has a dawn phenomenon  Although he may do better with the Amaryl in the evening for his overnight readings he previously had hypoglycemia from this and blood sugars after dinner or not consistently high; however has only 3 days worth of readings to review today  History of high blood pressure: This is now fairly good without medications and will continue to monitor, previously had lightheadedness with low-dose ACE inhibitor's No microalbuminuria present  Lipids: Recheck on the next visit  Stool Hemoccult was negative   There are no Patient Instructions on file for this visit.   Elayne Snare 12/19/2018, 4:33 PM   Note: This office note was prepared with Dragon voice recognition system technology. Any transcriptional errors that result from this process are unintentional.

## 2019-03-09 ENCOUNTER — Other Ambulatory Visit: Payer: Self-pay | Admitting: Endocrinology

## 2019-03-11 DIAGNOSIS — H2513 Age-related nuclear cataract, bilateral: Secondary | ICD-10-CM | POA: Diagnosis not present

## 2019-03-11 DIAGNOSIS — H40013 Open angle with borderline findings, low risk, bilateral: Secondary | ICD-10-CM | POA: Diagnosis not present

## 2019-03-11 DIAGNOSIS — Z794 Long term (current) use of insulin: Secondary | ICD-10-CM | POA: Diagnosis not present

## 2019-03-11 DIAGNOSIS — E119 Type 2 diabetes mellitus without complications: Secondary | ICD-10-CM | POA: Diagnosis not present

## 2019-03-11 LAB — HM DIABETES EYE EXAM

## 2019-03-30 DIAGNOSIS — L239 Allergic contact dermatitis, unspecified cause: Secondary | ICD-10-CM | POA: Diagnosis not present

## 2019-04-09 ENCOUNTER — Encounter: Payer: Self-pay | Admitting: Emergency Medicine

## 2019-04-18 ENCOUNTER — Other Ambulatory Visit: Payer: Self-pay

## 2019-04-18 ENCOUNTER — Other Ambulatory Visit (INDEPENDENT_AMBULATORY_CARE_PROVIDER_SITE_OTHER): Payer: Medicare Other

## 2019-04-18 DIAGNOSIS — E7800 Pure hypercholesterolemia, unspecified: Secondary | ICD-10-CM | POA: Diagnosis not present

## 2019-04-18 DIAGNOSIS — E1165 Type 2 diabetes mellitus with hyperglycemia: Secondary | ICD-10-CM | POA: Diagnosis not present

## 2019-04-18 LAB — COMPREHENSIVE METABOLIC PANEL
ALT: 14 U/L (ref 0–53)
AST: 14 U/L (ref 0–37)
Albumin: 4 g/dL (ref 3.5–5.2)
Alkaline Phosphatase: 62 U/L (ref 39–117)
BUN: 19 mg/dL (ref 6–23)
CO2: 23 mEq/L (ref 19–32)
Calcium: 9.3 mg/dL (ref 8.4–10.5)
Chloride: 107 mEq/L (ref 96–112)
Creatinine, Ser: 0.99 mg/dL (ref 0.40–1.50)
GFR: 75.58 mL/min (ref >60.00)
Glucose, Bld: 147 mg/dL — ABNORMAL HIGH (ref 70–99)
Potassium: 4 mEq/L (ref 3.5–5.1)
Sodium: 138 mEq/L (ref 135–145)
Total Bilirubin: 0.4 mg/dL (ref 0.2–1.2)
Total Protein: 6.5 g/dL (ref 6.0–8.3)

## 2019-04-18 LAB — LDL CHOLESTEROL, DIRECT: Direct LDL: 95 mg/dL

## 2019-04-18 LAB — HEMOGLOBIN A1C: Hgb A1c MFr Bld: 7 % — ABNORMAL HIGH (ref 4.6–6.5)

## 2019-04-18 LAB — LIPID PANEL
Cholesterol: 150 mg/dL (ref 0–200)
HDL: 34.6 mg/dL — ABNORMAL LOW (ref >39.00)
NonHDL: 114.97
Total CHOL/HDL Ratio: 4
Triglycerides: 203 mg/dL — ABNORMAL HIGH (ref 0.0–149.0)
VLDL: 40.6 mg/dL — ABNORMAL HIGH (ref 0.0–40.0)

## 2019-04-21 ENCOUNTER — Other Ambulatory Visit: Payer: Self-pay

## 2019-04-21 ENCOUNTER — Encounter: Payer: Self-pay | Admitting: Endocrinology

## 2019-04-21 ENCOUNTER — Ambulatory Visit (INDEPENDENT_AMBULATORY_CARE_PROVIDER_SITE_OTHER): Payer: Medicare Other | Admitting: Endocrinology

## 2019-04-21 VITALS — BP 120/72 | HR 75 | Ht 69.0 in | Wt 169.4 lb

## 2019-04-21 DIAGNOSIS — E782 Mixed hyperlipidemia: Secondary | ICD-10-CM

## 2019-04-21 DIAGNOSIS — E1165 Type 2 diabetes mellitus with hyperglycemia: Secondary | ICD-10-CM | POA: Diagnosis not present

## 2019-04-21 MED ORDER — LOVASTATIN 20 MG PO TABS: 20.0000 mg | ORAL_TABLET | Freq: Every morning | ORAL | 1 refills | Status: DC

## 2019-04-21 NOTE — Patient Instructions (Signed)
Check cost of farxiga  Stay on lovastatin

## 2019-04-21 NOTE — Progress Notes (Signed)
Patient ID: Joseph Sharp, male   DOB: [birth year], 68 y.o.   MRN: [id number]   Reason for Appointment: follow-up for diabetes and blood pressure   History of Present Illness    Diagnosis: Type 2 DIABETES MELITUS, diagnosed in 1977     PAST history: He has had long-standing diabetes managed with multiple drugs and usually well controlled His blood sugar control is somewhat dependent on his compliance with diet and exercise regimen Amaryl was stopped in 2014 because of tendency to hypoglycemia and Prandin was started  In 11 /14 his blood sugars were higher overall and A1c was 7% which was higher than usual Because of this he was started on Trulicity injections, 7.09 mg He did benefit overall from starting 6.28 mg Trulicity  RECENT history:  His A1c is now 7%, was 6.8   Non-insulin hypoglycemic drugs: Glipizide ER 2.5 mg daily , metformin 2g, pioglitazone 30 mg, Ozempic 0.5 mg weekly  Current management, blood sugar patterns and problems identified:  He was having trouble with the cost of Ozempic a couple of months ago and he stopped taking it on his own without letting us know  However because of his blood sugars starting to be as high as about 180 fasting he went back on it about a week or so ago  With this his blood sugars are improving  Still checking sugars at various times of the day  Highest blood sugar was 247 after drinking a milkshake  Otherwise his blood sugars nonfasting are generally not as high  He has tried to do some walking outside and is doing this fairly regularly  He has actually lost some weight and most of the time he is trying to eat healthy balanced meals   Side effects from medications: None  Monitors blood glucose:  1+ times a day     Glucometer:  Freestyle        Blood Glucose readings for the last 3 days from monitor download:    PRE-MEAL Fasting Lunch Dinner Bedtime Overall  Glucose range:  116-180     107-247  Mean/median:   158  159  154  162   POST-MEAL PC Breakfast PC Lunch PC Dinner  Glucose range:     Mean/median:  186  152  166   Previous readings:  PRE-MEAL Fasting Lunch Dinner Bedtime Overall  Glucose range:  127-149  146  98, 117    Mean/median:         POST-MEAL PC Breakfast PC Lunch PC Dinner  Glucose range:  152-225   164, 168  Mean/median:                Diet:  Has a egg and toast at breakfast with small amount of juice and fruit, usually getting low fat meals,  Dinner usually at 7 pm  EXERCISE: [facility] 4/7      Wt Readings from Last 3 Encounters:  04/21/19 169 lb 6.4 oz (76.8 kg)  12/19/18 175 lb 6.4 oz (79.6 kg)  10/22/18 174 lb (78.9 kg)   Lab Results  Component Value Date   HGBA1C 7.0 (H) 04/18/2019   HGBA1C 6.8 (H) 12/16/2018   HGBA1C 7.0 (H) 07/09/2018   Lab Results  Component Value Date   MICROALBUR <0.7 12/16/2018   LDLCALC 77 07/09/2018   CREATININE 0.99 04/18/2019     Allergies as of 04/21/2019   No Known Allergies     Medication List       Accurate  as of April 21, 2019  9:15 AM. If you have any questions, ask your nurse or doctor.        STOP taking these medications   cholecalciferol 1000 units tablet Commonly known as:  VITAMIN D Stopped by:  Elayne Snare, MD     TAKE these medications   EpiPen 2-Pak 0.3 mg/0.3 mL Soaj injection Generic drug:  EPINEPHrine Inject 0.3 mg into the muscle once as needed (anaphylaxis).   fluticasone 50 MCG/ACT nasal spray Commonly known as:  FLONASE Place 1 spray into both nostrils daily.   glipiZIDE 2.5 MG 24 hr tablet Commonly known as:  GLUCOTROL XL TAKE 1 TABLET BY MOUTH EVERY DAY WITH BREAKFAST   glucose blood test strip 1 each by Other route as needed for other. Use as instructed to check blood sugar once daily.   metFORMIN 500 MG 24 hr tablet Commonly known as:  GLUCOPHAGE-XR TAKE 4 TABLETS BY MOUTH EVERY DAY   Ozempic (0.25 or 0.5 MG/DOSE) 2 MG/1.5ML Sopn Generic drug:  Semaglutide(0.25 or 0.5MG /DOS)  INJECT 0.5 MG INTO THE SKIN ONCE A WEEK.   pioglitazone 30 MG tablet Commonly known as:  ACTOS TAKE 1 TABLET BY MOUTH EVERY DAY   tadalafil 20 MG tablet Commonly known as:  Cialis Take 1 tablet (20 mg total) by mouth daily as needed for erectile dysfunction.       Allergies: No Known Allergies  Past Medical History:  Diagnosis Date  . Allergy   . Diabetes mellitus without complication [facility])     Past Surgical History:  Procedure Laterality Date  . EYE SURGERY  01/2013   Eyelid surgery    Family History  Problem Relation Age of Onset  . Diabetes Mother   . Arthritis Mother   . Diabetes Brother   . Cancer Neg Hx   . Heart disease Neg Hx     Social History:  reports that he has never smoked. He has never used smokeless tobacco. He reports that he does not drink alcohol or use drugs.   Review of Systems    HYPERTENSION: This has been  mild and previously was tried on ramipril and lisinopril He has stopped taking his lisinopril in 2019 since he thought it was making her feel little dizzy  Blood pressure is consistently in the normal range    BP Readings from Last 3 Encounters:  04/21/19 120/72  12/19/18 136/72  10/22/18 121/73      HYPERLIPIDEMIA: The lipid abnormality consists of elevated LDL and triglycerides and low HDL LDL previously well controlled with lovastatin Has low HDL from metabolic syndrome and history of high triglycerides  He says he stopped lovastatin on his own a few months ago and did not let us know His triglycerides are high and LDL higher than before  Lab Results  Component Value Date   CHOL 150 04/18/2019   CHOL 132 07/09/2018   CHOL 117 10/10/2017   Lab Results  Component Value Date   HDL 34.60 (L) 04/18/2019   HDL 34.30 (L) 07/09/2018   HDL 36.30 (L) 10/10/2017   Lab Results  Component Value Date   LDLCALC 77 07/09/2018   LDLCALC 58 10/10/2017   LDLCALC 63 12/05/2016   Lab Results  Component Value Date   TRIG 203.0  (H) 04/18/2019   TRIG 104.0 07/09/2018   TRIG 113.0 10/10/2017   Lab Results  Component Value Date   CHOLHDL 4 04/18/2019   CHOLHDL 4 07/09/2018   CHOLHDL 3 10/10/2017  Lab Results  Component Value Date   LDLDIRECT 95.0 04/18/2019     He previously had transient hypogonadotropic hypogonadism which subsequently had resolved.  Was previously treated with AndroGel.   Lab Results  Component Value Date   TESTOSTERONE 354 07/10/2017    He takes vitamin D 1000 units as a supplement  Lab Results  Component Value Date   VD25OH 46.46 04/03/2017           Examination:   BP 120/72 (BP Location: Left Arm, Patient Position: Sitting, Cuff Size: Normal)   Pulse 75   Ht 5\' 9"  (1.753 m)   Wt 169 lb 6.4 oz (76.8 kg)   SpO2 96%   BMI 25.02 kg/m   Body mass index is 25.02 kg/m.       Physical Exam   ASSESSMENT/ PLAN:    DIABETES, non-insulin-dependent:   See history of present illness for detailed discussion of current diabetes management, blood sugar patterns and problems identified  His A1c is 7%, previously 6.8 %  He is on multiple drugs including Ozempic and Actos With leaving off his Ozempic recently his blood sugars had gone up significantly especially fasting Currently he is having difficulties with affording this during the donut hole However since he has a 50-month supply he is willing to continue this for now  He does do fairly well with his diet and exercise and keeping his weight down  Recently blood sugars are improving and not higher after meals unless he was off his diet Discussed potentially trying an SGLT2 drug on the next visit if less expensive Ozempic  Lipids: Emphasized the need for cardiovascular risk reduction with statin drugs Since he has a higher LDL and increased triglycerides he needs to go back to lovastatin Previously only on 20 mg and he agrees to go back on this Recheck on the next visit  Pressure is normal   There are no Patient  Instructions on file for this visit.   Elayne Snare 04/21/2019, 9:15 AM   Note: This office note was prepared with Dragon voice recognition system technology. Any transcriptional errors that result from this process are unintentional.

## 2019-06-05 ENCOUNTER — Other Ambulatory Visit: Payer: Self-pay | Admitting: Endocrinology

## 2019-06-08 ENCOUNTER — Other Ambulatory Visit: Payer: Self-pay | Admitting: Endocrinology

## 2019-06-09 ENCOUNTER — Other Ambulatory Visit: Payer: Self-pay | Admitting: Endocrinology

## 2019-06-29 ENCOUNTER — Other Ambulatory Visit: Payer: Self-pay | Admitting: Endocrinology

## 2019-07-01 DIAGNOSIS — D224 Melanocytic nevi of scalp and neck: Secondary | ICD-10-CM | POA: Diagnosis not present

## 2019-07-01 DIAGNOSIS — Z85828 Personal history of other malignant neoplasm of skin: Secondary | ICD-10-CM | POA: Diagnosis not present

## 2019-07-01 DIAGNOSIS — L821 Other seborrheic keratosis: Secondary | ICD-10-CM | POA: Diagnosis not present

## 2019-07-01 DIAGNOSIS — D2262 Melanocytic nevi of left upper limb, including shoulder: Secondary | ICD-10-CM | POA: Diagnosis not present

## 2019-07-01 DIAGNOSIS — L814 Other melanin hyperpigmentation: Secondary | ICD-10-CM | POA: Diagnosis not present

## 2019-07-01 DIAGNOSIS — D225 Melanocytic nevi of trunk: Secondary | ICD-10-CM | POA: Diagnosis not present

## 2019-07-01 DIAGNOSIS — L570 Actinic keratosis: Secondary | ICD-10-CM | POA: Diagnosis not present

## 2019-07-01 DIAGNOSIS — L853 Xerosis cutis: Secondary | ICD-10-CM | POA: Diagnosis not present

## 2019-07-01 DIAGNOSIS — D1801 Hemangioma of skin and subcutaneous tissue: Secondary | ICD-10-CM | POA: Diagnosis not present

## 2019-07-22 ENCOUNTER — Other Ambulatory Visit (INDEPENDENT_AMBULATORY_CARE_PROVIDER_SITE_OTHER): Payer: Medicare Other

## 2019-07-22 ENCOUNTER — Other Ambulatory Visit: Payer: Self-pay

## 2019-07-22 ENCOUNTER — Other Ambulatory Visit: Payer: Self-pay | Admitting: Endocrinology

## 2019-07-22 DIAGNOSIS — E1165 Type 2 diabetes mellitus with hyperglycemia: Secondary | ICD-10-CM

## 2019-07-22 DIAGNOSIS — E782 Mixed hyperlipidemia: Secondary | ICD-10-CM | POA: Diagnosis not present

## 2019-07-22 LAB — LIPID PANEL
Cholesterol: 125 mg/dL (ref 0–200)
HDL: 35 mg/dL — ABNORMAL LOW (ref >39.00)
LDL Cholesterol: 77 mg/dL (ref 0–99)
NonHDL: 89.99
Total CHOL/HDL Ratio: 4
Triglycerides: 66 mg/dL (ref 0.0–149.0)
VLDL: 13.2 mg/dL (ref 0.0–40.0)

## 2019-07-22 LAB — COMPREHENSIVE METABOLIC PANEL
ALT: 13 U/L (ref 0–53)
AST: 15 U/L (ref 0–37)
Albumin: 4.1 g/dL (ref 3.5–5.2)
Alkaline Phosphatase: 50 U/L (ref 39–117)
BUN: 23 mg/dL (ref 6–23)
CO2: 25 mEq/L (ref 19–32)
Calcium: 9.4 mg/dL (ref 8.4–10.5)
Chloride: 109 mEq/L (ref 96–112)
Creatinine, Ser: 0.92 mg/dL (ref 0.40–1.50)
GFR: 82.19 mL/min (ref >60.00)
Glucose, Bld: 122 mg/dL — ABNORMAL HIGH (ref 70–99)
Potassium: 3.9 mEq/L (ref 3.5–5.1)
Sodium: 141 mEq/L (ref 135–145)
Total Bilirubin: 0.4 mg/dL (ref 0.2–1.2)
Total Protein: 6.6 g/dL (ref 6.0–8.3)

## 2019-07-22 LAB — HEMOGLOBIN A1C: Hgb A1c MFr Bld: 6.3 % (ref 4.6–6.5)

## 2019-07-25 ENCOUNTER — Encounter: Payer: Self-pay | Admitting: Endocrinology

## 2019-07-25 ENCOUNTER — Other Ambulatory Visit: Payer: Self-pay

## 2019-07-25 ENCOUNTER — Ambulatory Visit (INDEPENDENT_AMBULATORY_CARE_PROVIDER_SITE_OTHER): Payer: Medicare Other | Admitting: Endocrinology

## 2019-07-25 DIAGNOSIS — E119 Type 2 diabetes mellitus without complications: Secondary | ICD-10-CM | POA: Diagnosis not present

## 2019-07-25 NOTE — Progress Notes (Signed)
Patient ID: Joseph Sharp, male   DOB: 07-Jul-1951, 68 y.o.   MRN: [id number]  Today's office visit was provided via telemedicine using video technique The patient was explained the limitations of evaluation and management by telemedicine and the availability of in person appointments.  The patient understood the limitations and agreed to proceed. Patient also understood that the telehealth visit is billable. . Location of the patient: Patient's home . Location of the provider: Physician office Only the patient and myself were participating in the encounter    Reason for Appointment: follow-up for diabetes and blood pressure   History of Present Illness    Diagnosis: Type 2 DIABETES MELITUS, diagnosed in 1977     PAST history: He has had long-standing diabetes managed with multiple drugs and usually well controlled His blood sugar control is somewhat dependent on his compliance with diet and exercise regimen Amaryl was stopped in 2014 because of tendency to hypoglycemia and Prandin was started  In 11 /14 his blood sugars were higher overall and A1c was 7% which was higher than usual Because of this he was started on Trulicity injections, A999333 mg He did benefit overall from starting A999333 mg Trulicity  RECENT history:  His A1c is now 6.3, previously 7%, was 6.8   Non-insulin hypoglycemic drugs: Glipizide ER 2.5 mg daily , metformin 2g, pioglitazone 30 mg, Ozempic 0.5 mg weekly  Current management, blood sugar patterns and problems identified:  He was able to restart his Ozempic after his last visit in June  He has been able to afford this now and his blood sugars have improved progressively  He thinks his diet is also better with eating more at home and planning home-cooked meals  He is still walking about 3-4 times a week but not anymore than before  His weight is now down to 160 pounds at home apparently, has been as much as 175 earlier this year  His blood  sugar monitoring is done mostly in the morning before breakfast and not after meals as directed  However his highest blood sugar has been only 145 lately and this was fasting  On his last visit blood sugars have been as high as 247  Lab glucose 122 fasting  No hypoglycemia with taking glipizide low-dose   Side effects from medications: None  Monitors blood glucose:  1+ times a day     Glucometer:  Freestyle        Blood Glucose readings for the last 2 to 3 weeks from patient reading of his numbers   PRE-MEAL Fasting Lunch Dinner Bedtime Overall  Glucose range:  102-145  86  93  126   Mean/median:     ?   POST-MEAL PC Breakfast PC Lunch PC Dinner  Glucose range:  121  111   Mean/median:        PREVIOUS readings:  PRE-MEAL Fasting Lunch Dinner Bedtime Overall  Glucose range:  116-180     107-247  Mean/median:  158  159  154  162   POST-MEAL PC Breakfast PC Lunch PC Dinner  Glucose range:     Mean/median:  186  152  166             Diet:  Has a egg and toast at breakfast with small amount of juice and fruit, usually getting low fat meals,  Dinner usually at 7 pm  EXERCISE: [facility] 4/7      Wt Readings from Last 3 Encounters:  04/21/19 169  lb 6.4 oz (76.8 kg)  12/19/18 175 lb 6.4 oz (79.6 kg)  10/22/18 174 lb (78.9 kg)   Lab Results  Component Value Date   HGBA1C 6.3 07/22/2019   HGBA1C 7.0 (H) 04/18/2019   HGBA1C 6.8 (H) 12/16/2018   Lab Results  Component Value Date   MICROALBUR <0.7 12/16/2018   LDLCALC 77 07/22/2019   CREATININE 0.92 07/22/2019     Allergies as of 07/25/2019   No Known Allergies     Medication List       Accurate as of July 25, 2019  8:33 AM. If you have any questions, ask your nurse or doctor.        EpiPen 2-Pak 0.3 mg/0.3 mL Soaj injection Generic drug: EPINEPHrine Inject 0.3 mg into the muscle once as needed (anaphylaxis).   fluticasone 50 MCG/ACT nasal spray Commonly known as: FLONASE Place 1 spray into both  nostrils daily.   glipiZIDE 2.5 MG 24 hr tablet Commonly known as: GLUCOTROL XL TAKE 1 TABLET BY MOUTH EVERY DAY WITH BREAKFAST   glucose blood test strip 1 each by Other route as needed for other. Use as instructed to check blood sugar once daily.   lovastatin 20 MG tablet Commonly known as: MEVACOR Take 1 tablet (20 mg total) by mouth every morning.   metFORMIN 500 MG 24 hr tablet Commonly known as: GLUCOPHAGE-XR TAKE 4 TABLETS BY MOUTH EVERY DAY   Ozempic (0.25 or 0.5 MG/DOSE) 2 MG/1.5ML Sopn Generic drug: Semaglutide(0.25 or 0.5MG /DOS) INJECT 0.5 MG INTO THE SKIN ONCE A WEEK.   pioglitazone 30 MG tablet Commonly known as: ACTOS TAKE 1 TABLET BY MOUTH EVERY DAY   tadalafil 20 MG tablet Commonly known as: Cialis Take 1 tablet (20 mg total) by mouth daily as needed for erectile dysfunction.       Allergies: No Known Allergies  Past Medical History:  Diagnosis Date  . Allergy   . Diabetes mellitus without complication [facility])     Past Surgical History:  Procedure Laterality Date  . EYE SURGERY  01/2013   Eyelid surgery    Family History  Problem Relation Age of Onset  . Diabetes Mother   . Arthritis Mother   . Diabetes Brother   . Cancer Neg Hx   . Heart disease Neg Hx     Social History:  reports that he has never smoked. He has never used smokeless tobacco. He reports that he does not drink alcohol or use drugs.   Review of Systems    HYPERTENSION: This has been  mild and previously was tried on ramipril and lisinopril He has stopped taking his lisinopril in 2019 since he thought it was making her feel little dizzy  Blood pressure is most recently in the normal range, not monitoring at home    BP Readings from Last 3 Encounters:  04/21/19 120/72  12/19/18 136/72  10/22/18 121/73      HYPERLIPIDEMIA: The lipid abnormality consists of elevated LDL and triglycerides and low HDL LDL previously well controlled with lovastatin Has low HDL from  metabolic syndrome and history of high triglycerides  He had stopped lovastatin on his own a few months ago In June his triglycerides had gone up to 203 and LDL was 95 Although he tried taking the lovastatin again for a month he says it made his legs hurt and he stopped taking this again  His triglycerides are back to normal and LDL is 77 without any treatment  He has lost weight as  above Lab Results  Component Value Date   CHOL 125 07/22/2019   CHOL 150 04/18/2019   CHOL 132 07/09/2018   Lab Results  Component Value Date   HDL 35.00 (L) 07/22/2019   HDL 34.60 (L) 04/18/2019   HDL 34.30 (L) 07/09/2018   Lab Results  Component Value Date   LDLCALC 77 07/22/2019   LDLCALC 77 07/09/2018   LDLCALC 58 10/10/2017   Lab Results  Component Value Date   TRIG 66.0 07/22/2019   TRIG 203.0 (H) 04/18/2019   TRIG 104.0 07/09/2018   Lab Results  Component Value Date   CHOLHDL 4 07/22/2019   CHOLHDL 4 04/18/2019   CHOLHDL 4 07/09/2018   Lab Results  Component Value Date   LDLDIRECT 95.0 04/18/2019     He previously had transient hypogonadotropic hypogonadism which subsequently had resolved.  Was previously treated with AndroGel.   Lab Results  Component Value Date   TESTOSTERONE 354 07/10/2017    He takes vitamin D 1000 units OTC as recommended for supplementation  Lab Results  Component Value Date   VD25OH 46.46 04/03/2017           Examination:   There were no vitals taken for this visit.  There is no height or weight on file to calculate BMI.       Physical Exam   ASSESSMENT/ PLAN:    DIABETES, non-insulin-dependent:   See history of present illness for detailed discussion of current diabetes management, blood sugar patterns and problems identified  His A1c is excellent at 6.3 compared to 7%  His weight is down significantly this year and this is with the help of an excellent diet and continuing Ozempic which is helping him moderate his portions also  He is trying to do some walking on his own also Blood sugars are mostly near normal with only occasional high fasting readings However has not done enough readings after meals  Currently he is wanting to continue his Ozempic despite the high out-of-pocket expense However discussed that if need be we can try an SGLT2 drug if needed on the next visit  Lipids: He does not want to take his lovastatin because he thinks it now causes leg pains even though he had taken this previously without side effect With his diet and weight loss his LDL is 77 triglyceride back to normal No other risk factors present and will continue to monitor on diet alone    There are no Patient Instructions on file for this visit.   Elayne Snare 07/25/2019, 8:33 AM   Note: This office note was prepared with Dragon voice recognition system technology. Any transcriptional errors that result from this process are unintentional.

## 2019-08-26 DIAGNOSIS — Z23 Encounter for immunization: Secondary | ICD-10-CM | POA: Diagnosis not present

## 2019-09-02 ENCOUNTER — Other Ambulatory Visit: Payer: Self-pay | Admitting: Endocrinology

## 2019-09-30 ENCOUNTER — Other Ambulatory Visit: Payer: Self-pay | Admitting: Endocrinology

## 2019-10-21 ENCOUNTER — Other Ambulatory Visit: Payer: Self-pay | Admitting: Endocrinology

## 2019-10-28 LAB — HM DIABETES EYE EXAM

## 2019-11-25 ENCOUNTER — Other Ambulatory Visit (INDEPENDENT_AMBULATORY_CARE_PROVIDER_SITE_OTHER): Payer: Medicare Other

## 2019-11-25 ENCOUNTER — Other Ambulatory Visit: Payer: Self-pay | Admitting: Endocrinology

## 2019-11-25 ENCOUNTER — Other Ambulatory Visit: Payer: Self-pay

## 2019-11-25 DIAGNOSIS — E119 Type 2 diabetes mellitus without complications: Secondary | ICD-10-CM

## 2019-11-25 LAB — BASIC METABOLIC PANEL
BUN: 19 mg/dL (ref 6–23)
CO2: 26 mEq/L (ref 19–32)
Calcium: 9.8 mg/dL (ref 8.4–10.5)
Chloride: 108 mEq/L (ref 96–112)
Creatinine, Ser: 1.04 mg/dL (ref 0.40–1.50)
GFR: 71.27 mL/min (ref >60.00)
Glucose, Bld: 137 mg/dL — ABNORMAL HIGH (ref 70–99)
Potassium: 4.1 mEq/L (ref 3.5–5.1)
Sodium: 140 mEq/L (ref 135–145)

## 2019-11-25 LAB — MICROALBUMIN / CREATININE URINE RATIO
Creatinine,U: 150 mg/dL
Microalb Creat Ratio: 0.6 mg/g (ref 0.0–30.0)
Microalb, Ur: 0.8 mg/dL (ref 0.0–1.9)

## 2019-11-25 LAB — HEMOGLOBIN A1C: Hgb A1c MFr Bld: 6.3 % (ref 4.6–6.5)

## 2019-11-26 ENCOUNTER — Other Ambulatory Visit: Payer: Self-pay

## 2019-11-28 ENCOUNTER — Encounter: Payer: Self-pay | Admitting: Endocrinology

## 2019-11-28 ENCOUNTER — Ambulatory Visit (INDEPENDENT_AMBULATORY_CARE_PROVIDER_SITE_OTHER): Payer: Medicare Other | Admitting: Endocrinology

## 2019-11-28 VITALS — BP 122/72 | HR 98 | Ht 69.0 in | Wt 164.6 lb

## 2019-11-28 DIAGNOSIS — E782 Mixed hyperlipidemia: Secondary | ICD-10-CM

## 2019-11-28 DIAGNOSIS — E119 Type 2 diabetes mellitus without complications: Secondary | ICD-10-CM | POA: Diagnosis not present

## 2019-11-28 DIAGNOSIS — E559 Vitamin D deficiency, unspecified: Secondary | ICD-10-CM

## 2019-11-28 NOTE — Patient Instructions (Signed)
We are committed to keeping you informed about the COVID-19 vaccine.  As the vaccine continues to become available for each phase, we will ensure that patients who meet the criteria receive the information they need to access vaccination opportunities. Continue to check your MyChart account and [email or web address] for updates. Please review the Phase 1b information below.  Following [place]'s guidelines for the distribution of COVID-19 vaccines we are pleased to share our plans to begin offering vaccines to those 75 and older (Phase 1b). Here are details of those plans:  [place] On Tuesday, Jan. 19, the [facility]) and [place] begin large-scale COVID-19 vaccinations at the [place]. The vaccinations are appointment only and for those 72 and older.  It is expected that 750 will initially be vaccinated per day [facility]. Capacity is expected to grow in the weeks ahead. However, the number of reservations accepted depends on the amount of vaccine available.  Online or Phone Registration Only Walk-ins will NOT be accepted. Registration will open on Friday, January 15.   Health Department Registration [facility] residents only)  [place] (Any local residents) site: [email or web address]  Other Counties We are also working in partnership with county health agencies in [place], [place] and [place] counties to ensure continuing vaccination availability in alignment with state guidelines in the weeks and months ahead. Information on phase 1b COVID-19 vaccination clinics being offered by local county health agencies is provided in the website links below for your convenience:  [place]'s phase 1b vaccination guidelines, prioritizing those 75 and over as the next eligible group  to receive the COVID-19 vaccine, are detailed at [email or web address].   Vaccine Safety and Effectiveness Clinical trials for the Pfizer COVID-19 vaccine involved 42,000 people and showed that the vaccine is more than 95% effective in preventing COVID-19 with no serious safety concerns. Similar results have been reported for the Moderna COVID-19 vaccine. Side effects reported in the [place] clinical trials include a sore arm at the injection site, fatigue, headache, chills and fever. While side effects from the [place] COVID-19 vaccine are higher than for a typical flu vaccine, they are lower in many ways than side effects from the leading vaccine to prevent shingles. Side effects are signs that a vaccine is working and are related to your immune system being stimulated to produce antibodies against infection. Side effects from vaccination are far less significant than health impacts from COVID-19.  Staying Informed Pharmacists, infectious disease doctors, critical care nurses and other experts at [facility] continue to speak publicly through media interviews and direct communication with our patients and communities about the safety, effectiveness and importance of vaccines to eliminate COVID-19. In addition, reliable information on vaccine safety, effectiveness, side effects and more is available on the following websites:  [place] Department of Health and Human Services COVID-19 Vaccine Information Website.  U.S. Centers for Disease Control and Prevention [id number] Human resources officer.     For our most current information, please visit [email or web address].

## 2019-11-28 NOTE — Progress Notes (Signed)
Patient ID: Joseph Sharp, male   DOB: [birth year], 69 y.o.   MRN: [id number]    Reason for Appointment: follow-up for diabetes and blood pressure   History of Present Illness    Diagnosis: Type 2 DIABETES MELITUS, diagnosed in 1977     PAST history: He has had long-standing diabetes managed with multiple drugs and usually well controlled His blood sugar control is somewhat dependent on his compliance with diet and exercise regimen Amaryl was stopped in 2014 because of tendency to hypoglycemia and Prandin was started  In 11 /14 his blood sugars were higher overall and A1c was 7% which was higher than usual Because of this he was started on Trulicity injections, A999333 mg He did benefit overall from starting A999333 mg Trulicity  RECENT history:  His A1c is  6.3, previously 7%, was 6.8   Non-insulin hypoglycemic drugs: Glipizide ER 2.5 mg daily , metformin 2g, pioglitazone 30 mg, Ozempic 0.25 mg weekly  Current management, blood sugar patterns and problems identified:  He was able to reduce his Ozempic in 07/2019 without any apparent increase in his blood sugars  He was concerned about the expense of this  Recently appears to have lost some more weight and likely to be from cutting back on portions and planning home-cooked meals  He has not been walking as much in the wintertime but generally still walking about half a mile on the good days  He generally appears to have high readings in the mornings rather than after meals  However has not checked readings after meals lately  AVERAGE blood sugar at home recently 116 with range 101-130, all readings in the mornings  Lab glucose 137 fasting  No hypoglycemia with his 2.5 mg glipizide    Side effects from medications: None  Monitors blood glucose:  1+ times a day     Glucometer:  Freestyle        Blood Glucose readings as above  Previous readings  PRE-MEAL Fasting Lunch Dinner Bedtime Overall  Glucose  range:  102-145  86  93  126   Mean/median:     ?   POST-MEAL PC Breakfast PC Lunch PC Dinner  Glucose range:  121  111   Mean/median:                 Diet:  Has a egg and toast at breakfast with small amount of juice and fruit, usually getting low fat meals,  Dinner usually at 7 pm  EXERCISE: Walks 1-4/7  days    Wt Readings from Last 3 Encounters:  11/28/19 164 lb 9.6 oz (74.7 kg)  04/21/19 169 lb 6.4 oz (76.8 kg)  12/19/18 175 lb 6.4 oz (79.6 kg)   Lab Results  Component Value Date   HGBA1C 6.3 11/25/2019   HGBA1C 6.3 07/22/2019   HGBA1C 7.0 (H) 04/18/2019   Lab Results  Component Value Date   MICROALBUR 0.8 11/25/2019   LDLCALC 77 07/22/2019   CREATININE 1.04 11/25/2019     Allergies as of 11/28/2019   No Known Allergies     Medication List       Accurate as of November 28, 2019 10:06 AM. If you have any questions, ask your nurse or doctor.        STOP taking these medications   lovastatin 20 MG tablet Commonly known as: MEVACOR Stopped by: Elayne Snare, MD     TAKE these medications   EpiPen 2-Pak 0.3 mg/0.3 mL Soaj injection  Generic drug: EPINEPHrine Inject 0.3 mg into the muscle once as needed (anaphylaxis).   fluticasone 50 MCG/ACT nasal spray Commonly known as: FLONASE Place 1 spray into both nostrils daily.   glipiZIDE 2.5 MG 24 hr tablet Commonly known as: GLUCOTROL XL TAKE 1 TABLET BY MOUTH EVERY DAY WITH BREAKFAST   glucose blood test strip 1 each by Other route as needed for other. Use as instructed to check blood sugar once daily.   metFORMIN 500 MG 24 hr tablet Commonly known as: GLUCOPHAGE-XR TAKE 4 TABLETS BY MOUTH EVERY DAY   Ozempic (0.25 or 0.5 MG/DOSE) 2 MG/1.5ML Sopn Generic drug: Semaglutide(0.25 or 0.5MG /DOS) Inject 0.25 mg into the skin once a week. Inject 0.25mg  under the skin once weekly. What changed: Another medication with the same name was removed. Continue taking this medication, and follow the directions you see  here. Changed by: Elayne Snare, MD   pioglitazone 30 MG tablet Commonly known as: ACTOS TAKE 1 TABLET BY MOUTH EVERY DAY   tadalafil 20 MG tablet Commonly known as: Cialis Take 1 tablet (20 mg total) by mouth daily as needed for erectile dysfunction.       Allergies: No Known Allergies  Past Medical History:  Diagnosis Date  . Allergy   . Diabetes mellitus without complication [facility])     Past Surgical History:  Procedure Laterality Date  . EYE SURGERY  01/2013   Eyelid surgery    Family History  Problem Relation Age of Onset  . Diabetes Mother   . Arthritis Mother   . Diabetes Brother   . Cancer Neg Hx   . Heart disease Neg Hx     Social History:  reports that he has never smoked. He has never used smokeless tobacco. He reports that he does not drink alcohol or use drugs.   Review of Systems    Blood pressure is now consistently in the normal range without medications    BP Readings from Last 3 Encounters:  11/28/19 122/72  04/21/19 120/72  12/19/18 136/72      HYPERLIPIDEMIA: The lipid abnormality consists of elevated LDL and triglycerides and low HDL LDL previously well controlled with lovastatin Has low HDL from metabolic syndrome and history of high triglycerides  He had stopped lovastatin in 2020 In June 2020 his triglycerides had gone up to 203 and LDL was 95 Although he tried taking the lovastatin again for a month he says it made his legs hurt and he stopped taking this again  His triglycerides are back to normal in 9/20 and LDL is 77 without any treatment  He has lost weight as above  Lab Results  Component Value Date   CHOL 125 07/22/2019   CHOL 150 04/18/2019   CHOL 132 07/09/2018   Lab Results  Component Value Date   HDL 35.00 (L) 07/22/2019   HDL 34.60 (L) 04/18/2019   HDL 34.30 (L) 07/09/2018   Lab Results  Component Value Date   LDLCALC 77 07/22/2019   LDLCALC 77 07/09/2018   LDLCALC 58 10/10/2017   Lab Results  Component  Value Date   TRIG 66.0 07/22/2019   TRIG 203.0 (H) 04/18/2019   TRIG 104.0 07/09/2018   Lab Results  Component Value Date   CHOLHDL 4 07/22/2019   CHOLHDL 4 04/18/2019   CHOLHDL 4 07/09/2018   Lab Results  Component Value Date   LDLDIRECT 95.0 04/18/2019     He takes vitamin D 1000 units OTC as recommended for supplementation  Lab  Results  Component Value Date   VD25OH 46.46 04/03/2017    Asking about swelling on third right toe after trauma a few weeks ago, this has not been painful       Examination:   BP 122/72 (BP Location: Left Arm, Patient Position: Sitting, Cuff Size: Normal)   Pulse 98   Ht 5\' 9"  (1.753 m)   Wt 164 lb 9.6 oz (74.7 kg)   SpO2 96%   BMI 24.31 kg/m   Body mass index is 24.31 kg/m.      Diabetic Foot Exam - Simple   Simple Foot Form Diabetic Foot exam was performed with the following findings: Yes   Visual Inspection See comments: Yes Sensation Testing Intact to touch and monofilament testing bilaterally: Yes Pulse Check Posterior Tibialis and Dorsalis pulse intact bilaterally: Yes Comments Right third toe shows Heberden's nodes on the distal joint, nontender and no skin changes     Physical Exam   ASSESSMENT/ PLAN:    DIABETES, non-insulin-dependent:   See history of present illness for detailed discussion of current diabetes management, blood sugar patterns and problems identified  His A1c is excellent at 6.3 and stable  His weight is gradually coming down He is benefiting from even 0.25 mg Ozempic Also diet has been consistently good especially after his retirement He has not monitored blood sugars very much and discussed needing to check blood sugars at a variety of times He will call if blood sugars start going up May consider increasing Ozempic if blood sugars are higher but currently doing well with the lower dose  Lipids: Previously normal without medication However discussed that if his LDL starts increasing above 70  may consider a different statin to reduce cardiovascular risk  No signs of neuropathy or vascular disease on foot exam today  Right third toe swelling: This appears to be osteoarthritic changes and he can discuss further with PCP also  Normal microalbumin  Given information on scheduling Covid vaccine and recommended doing this as soon as possible  History of vitamin D deficiency: To check level on the next visit   Patient Instructions  We are committed to keeping you informed about the COVID-19 vaccine.  As the vaccine continues to become available for each phase, we will ensure that patients who meet the criteria receive the information they need to access vaccination opportunities. Continue to check your MyChart account and [email or web address] for updates. Please review the Phase 1b information below.  Following [place]'s guidelines for the distribution of COVID-19 vaccines we are pleased to share our plans to begin offering vaccines to those 75 and older (Phase 1b). Here are details of those plans:  [place] On Tuesday, Jan. 19, the [facility]) and [place] begin large-scale COVID-19 vaccinations at the [place]. The vaccinations are appointment only and for those 84 and older.  It is expected that 750 will initially be vaccinated per day [facility]. Capacity is expected to grow in the weeks ahead. However, the number of reservations accepted depends on the amount of vaccine available.  Online or Phone Registration Only Walk-ins will NOT be accepted. Registration will open on Friday, January 15.   Health Department Registration [facility] residents only)  [place] (Any local residents) site: [email or web address]  Other Counties We are also working in partnership with county health agencies in [place], [place] and  [place] counties to ensure continuing vaccination  availability in alignment with state guidelines in the weeks and months ahead. Information on phase 1b COVID-19 vaccination clinics being offered by local county health agencies is provided in the website links below for your convenience:  [place]'s phase 1b vaccination guidelines, prioritizing those 75 and over as the next eligible group to receive the COVID-19 vaccine, are detailed at [email or web address].   Vaccine Safety and Effectiveness Clinical trials for the Pfizer COVID-19 vaccine involved 42,000 people and showed that the vaccine is more than 95% effective in preventing COVID-19 with no serious safety concerns. Similar results have been reported for the Moderna COVID-19 vaccine. Side effects reported in the [place] clinical trials include a sore arm at the injection site, fatigue, headache, chills and fever. While side effects from the [place] COVID-19 vaccine are higher than for a typical flu vaccine, they are lower in many ways than side effects from the leading vaccine to prevent shingles. Side effects are signs that a vaccine is working and are related to your immune system being stimulated to produce antibodies against infection. Side effects from vaccination are far less significant than health impacts from COVID-19.  Staying Informed Pharmacists, infectious disease doctors, critical care nurses and other experts at [facility] continue to speak publicly through media interviews and direct communication with our patients and communities about the safety, effectiveness and importance of vaccines to eliminate COVID-19. In addition, reliable information on vaccine safety, effectiveness, side effects and more is available on the following websites:  [place] Department of Health and Human Services COVID-19 Vaccine Information Website.  U.S. Centers for Disease Control and Prevention  [id number] Human resources officer.     For our most current information, please visit [email or web address].     Elayne Snare 11/28/2019, 10:06 AM   Note: This office note was prepared with Dragon voice recognition system technology. Any transcriptional errors that result from this process are unintentional.

## 2019-12-03 DIAGNOSIS — Z20828 Contact with and (suspected) exposure to other viral communicable diseases: Secondary | ICD-10-CM | POA: Diagnosis not present

## 2020-02-16 ENCOUNTER — Other Ambulatory Visit: Payer: Self-pay | Admitting: Endocrinology

## 2020-03-06 ENCOUNTER — Other Ambulatory Visit: Payer: Self-pay | Admitting: Endocrinology

## 2020-03-06 ENCOUNTER — Encounter: Payer: Self-pay | Admitting: Family Medicine

## 2020-03-11 ENCOUNTER — Encounter: Payer: Self-pay | Admitting: Family Medicine

## 2020-03-11 ENCOUNTER — Ambulatory Visit (INDEPENDENT_AMBULATORY_CARE_PROVIDER_SITE_OTHER): Payer: Medicare Other

## 2020-03-11 ENCOUNTER — Ambulatory Visit (INDEPENDENT_AMBULATORY_CARE_PROVIDER_SITE_OTHER): Payer: Medicare Other | Admitting: Family Medicine

## 2020-03-11 ENCOUNTER — Other Ambulatory Visit: Payer: Self-pay

## 2020-03-11 VITALS — BP 138/90 | HR 90 | Temp 97.6°F | Ht 69.0 in | Wt 169.0 lb

## 2020-03-11 DIAGNOSIS — R200 Anesthesia of skin: Secondary | ICD-10-CM | POA: Diagnosis not present

## 2020-03-11 DIAGNOSIS — M79674 Pain in right toe(s): Secondary | ICD-10-CM | POA: Diagnosis not present

## 2020-03-11 DIAGNOSIS — M7989 Other specified soft tissue disorders: Secondary | ICD-10-CM | POA: Diagnosis not present

## 2020-03-11 DIAGNOSIS — M19071 Primary osteoarthritis, right ankle and foot: Secondary | ICD-10-CM

## 2020-03-11 NOTE — Patient Instructions (Addendum)
X-ray did not show any broken bone at this time but there was significant arthritis. That can happen after previous break.  I can refer you to podiatrist. For now - wear shoe with sufficient toe space.  Numbness may be due to pressure from gardening. Try to avoid direct pressure to knee or knee pads if necessary to squat on knee. Recheck in next 3-4 weeks of not improving.   Return to the clinic or go to the nearest emergency room if any of your symptoms worsen or new symptoms occur.  If you have lab work done today you will be contacted with your lab results within the next 2 weeks.  If you have not heard from [place] then please contact us. The fastest way to get your results is to register for My Chart.   IF you received an x-ray today, you will receive an invoice from [facility]. Please contact [facility] at [phone] with questions or concerns regarding your invoice.   IF you received labwork today, you will receive an invoice from [place]. Please contact [facility] at [phone] with questions or concerns regarding your invoice.   Our billing staff will not be able to assist you with questions regarding bills from these companies.  You will be contacted with the lab results as soon as they are available. The fastest way to get your results is to activate your My Chart account. Instructions are located on the last page of this paperwork. If you have not heard from [place] regarding the results in 2 weeks, please contact this office.

## 2020-03-11 NOTE — Progress Notes (Signed)
Subjective:  Patient ID: Joseph Sharp, male    DOB: May 13, 1951  Age: 69 y.o. MRN: [id number]  CC:  Chief Complaint  Patient presents with  . Foot Pain    pt is concerened that he may have arthritis in his R foot on his middle toe. pt states he stubed it back in august it has been hurtting off and on. this past month has been more painful per the pt.   . Numbness    on L knee. along the out side edge of the knee cap. pt notice this numbeness about 2 weeks ago. no known triggers for this.    HPI Joseph Sharp presents for   R foot pain: Middle toe.  Stubbed in 06/2019. Persistent pain for about a week. No color change, but red, swollen.  No discharge.  More sore past month. Had been better, but not resolved.  No prior broken toes.  Tx: none.   L Knee numbness: Outside of left knee.  No injury known, past 2 weeks.  No new projects, exercises.  No brace, compression.  No knee injury/surgery on left knee.  Only on outside of knee.  No knee pain. Just numb on skin. No back pain, no sciatica in past known.  Retired. Home projects at times. Planting flowers. 2 times per week. Knees on ground - no knee pads.   History Patient Active Problem List   Diagnosis Date Noted  . ED (erectile dysfunction) of organic origin 09/04/2014  . Allergic rhinitis 08/10/2014  . Diabetes mellitus, stable ([place]) 06/17/2013  . Pure hypercholesterolemia 06/17/2013   Past Medical History:  Diagnosis Date  . Allergy   . Diabetes mellitus without complication [facility])    Past Surgical History:  Procedure Laterality Date  . EYE SURGERY  01/2013   Eyelid surgery   No Known Allergies Prior to Admission medications   Medication Sig Start Date End Date Taking? Authorizing Provider  EPIPEN 2-PAK 0.3 MG/0.3ML SOAJ injection Inject 0.3 mg into the muscle once as needed (anaphylaxis).  07/29/13  Yes [provider]  fluticasone (FLONASE) 50 MCG/ACT nasal spray Place 1 spray into both  nostrils daily.  07/29/13  Yes [provider]  glipiZIDE (GLUCOTROL XL) 2.5 MG 24 hr tablet TAKE 1 TABLET BY MOUTH EVERY DAY WITH BREAKFAST 09/30/19  Yes Elayne Snare, MD  glucose blood test strip 1 each by Other route as needed for other. Use as instructed to check blood sugar once daily. 12/19/18  Yes Elayne Snare, MD  metFORMIN (GLUCOPHAGE-XR) 500 MG 24 hr tablet TAKE 4 TABLETS BY MOUTH EVERY DAY 02/16/20  Yes Elayne Snare, MD  OZEMPIC, 0.25 OR 0.5 MG/DOSE, 2 MG/1.5ML SOPN INJECT 0.5 MG INTO THE SKIN ONCE A WEEK. 03/08/20  Yes Elayne Snare, MD  pioglitazone (ACTOS) 30 MG tablet TAKE 1 TABLET BY MOUTH EVERY DAY 11/25/19  Yes Elayne Snare, MD  tadalafil (CIALIS) 20 MG tablet Take 1 tablet (20 mg total) by mouth daily as needed for erectile dysfunction. 08/06/17  Yes Elayne Snare, MD   Social History   Socioeconomic History  . Marital status: Married    Spouse name: [name]  . Number of children: 1  . Years of education: Not on file  . Highest education level: Not on file  Occupational History  . Occupation: Engineering    Comment: [employer]  Tobacco Use  . Smoking status: Never Smoker  . Smokeless tobacco: Never Used  Substance and Sexual Activity  . Alcohol use: No  .  Drug use: No  . Sexual activity: Yes  Other Topics Concern  . Not on file  Social History Narrative   Lives with his wife.  Their daughter lives in [place], [place].   Social Determinants of Health   Financial Resource Strain:   . Difficulty of Paying Living Expenses:   Food Insecurity:   . Worried About Charity fundraiser in the Last Year:   . Arboriculturist in the Last Year:   Transportation Needs:   . Film/video editor (Medical):   Marland Kitchen Lack of Transportation (Non-Medical):   Physical Activity:   . Days of Exercise per Week:   . Minutes of Exercise per Session:   Stress:   . Feeling of Stress :   Social Connections:   . Frequency of Communication with Friends and Family:   . Frequency of  Social Gatherings with Friends and Family:   . Attends Religious Services:   . Active Member of Clubs or Organizations:   . Attends Archivist Meetings:   Marland Kitchen Marital Status:   Intimate Partner Violence:   . Fear of Current or Ex-Partner:   . Emotionally Abused:   Marland Kitchen Physically Abused:   . Sexually Abused:     Review of Systems Per HPI.   Objective:   Vitals:   03/11/20 1701  BP: 138/90  Pulse: 90  Temp: 97.6 F (36.4 C)  TempSrc: Temporal  SpO2: 98%  Weight: 169 lb (76.7 kg)  Height: 5\' 9"  (1.753 m)     Physical Exam Vitals reviewed.  Constitutional:      General: He is not in acute distress.    Appearance: He is well-developed.  HENT:     Head: Normocephalic and atraumatic.  Cardiovascular:     Rate and Rhythm: Normal rate.  Pulmonary:     Effort: Pulmonary effort is normal.  Musculoskeletal:       Legs:     Comments: Right foot, bony prominence of the mid to distal third toe, slight discomfort in affected area.  MTP unaffected, remainder of foot nontender, remainder of toes nontender.  Skin intact.  Left knee: Skin intact, no erythema, no effusion.  Pain-free range of motion.  Joint line nontender, patellar and patellar tendon nontender.  Approximately 12 x 5 cm area of decreased sensation at the inferior lateral knee.  Skin is intact, no blisters, no rash  Neurological:     Mental Status: He is alert and oriented to person, place, and time.   DG Toe 3rd Right  Result Date: 03/11/2020 CLINICAL DATA:  Pain of toe of right foot. EXAM: RIGHT THIRD TOE COMPARISON:  None. FINDINGS: There is no evidence of fracture or dislocation. Osteoarthritis with endplate spurring and joint space narrowing, significant involving the distal interphalangeal joint. No evidence of inflammatory arthropathy. No erosion, periosteal reaction or bony destruction. Soft tissues are unremarkable. IMPRESSION: Osteoarthritis of the third digit. No evidence of inflammatory arthropathy or  acute bony abnormality. Electronically Signed   By: Keith Rake M.D.   On: 03/11/2020 17:24     Assessment & Plan:  Joseph Sharp is a 69 y.o. male . Pain of toe of right foot - Plan: DG Toe 3rd Right Toe swelling - Plan: DG Toe 3rd Right  -Significant arthritis of third digit, possible secondary to previous injury/fracture with secondary prominent bone healing.  Will refer to podiatry.  Wide toe box or shoe with sufficient toe spacer recommended for now.  Numbness in left  leg  -Possible compressive neuropathy from gardening.  Recommended kneepads or avoidance of direct pressure to the area for now, recheck 3 to 4 weeks if not improving.    No orders of the defined types were placed in this encounter.  Patient Instructions       If you have lab work done today you will be contacted with your lab results within the next 2 weeks.  If you have not heard from [place] then please contact us. The fastest way to get your results is to register for My Chart.   IF you received an x-ray today, you will receive an invoice from [facility]. Please contact [facility] at [phone] with questions or concerns regarding your invoice.   IF you received labwork today, you will receive an invoice from [facility]. Please contact [facility] at [phone] with questions or concerns regarding your invoice.   Our billing staff will not be able to assist you with questions regarding bills from these companies.  You will be contacted with the lab results as soon as they are available. The fastest way to get your results is to activate your My Chart account. Instructions are located on the last page of this paperwork. If you have not heard from [place] regarding the results in 2 weeks, please contact this office.         Signed, Merri Ray, MD [facility]

## 2020-03-12 DIAGNOSIS — H40013 Open angle with borderline findings, low risk, bilateral: Secondary | ICD-10-CM | POA: Diagnosis not present

## 2020-03-12 DIAGNOSIS — Z794 Long term (current) use of insulin: Secondary | ICD-10-CM | POA: Diagnosis not present

## 2020-03-12 DIAGNOSIS — E119 Type 2 diabetes mellitus without complications: Secondary | ICD-10-CM | POA: Diagnosis not present

## 2020-03-12 DIAGNOSIS — H2513 Age-related nuclear cataract, bilateral: Secondary | ICD-10-CM | POA: Diagnosis not present

## 2020-03-12 LAB — HM DIABETES EYE EXAM

## 2020-03-29 ENCOUNTER — Other Ambulatory Visit: Payer: Self-pay

## 2020-03-29 ENCOUNTER — Other Ambulatory Visit (INDEPENDENT_AMBULATORY_CARE_PROVIDER_SITE_OTHER): Payer: Medicare Other

## 2020-03-29 DIAGNOSIS — E119 Type 2 diabetes mellitus without complications: Secondary | ICD-10-CM | POA: Diagnosis not present

## 2020-03-29 DIAGNOSIS — E559 Vitamin D deficiency, unspecified: Secondary | ICD-10-CM

## 2020-03-29 DIAGNOSIS — E782 Mixed hyperlipidemia: Secondary | ICD-10-CM | POA: Diagnosis not present

## 2020-03-29 LAB — VITAMIN D 25 HYDROXY (VIT D DEFICIENCY, FRACTURES): VITD: 54.76 ng/mL (ref 30.00–100.00)

## 2020-03-29 LAB — HEMOGLOBIN A1C: Hgb A1c MFr Bld: 6.4 % (ref 4.6–6.5)

## 2020-03-29 LAB — COMPREHENSIVE METABOLIC PANEL
ALT: 12 U/L (ref 0–53)
AST: 15 U/L (ref 0–37)
Albumin: 4 g/dL (ref 3.5–5.2)
Alkaline Phosphatase: 52 U/L (ref 39–117)
BUN: 16 mg/dL (ref 6–23)
CO2: 26 mEq/L (ref 19–32)
Calcium: 9.3 mg/dL (ref 8.4–10.5)
Chloride: 108 mEq/L (ref 96–112)
Creatinine, Ser: 1.03 mg/dL (ref 0.40–1.50)
GFR: 72 mL/min (ref >60.00)
Glucose, Bld: 153 mg/dL — ABNORMAL HIGH (ref 70–99)
Potassium: 4.2 mEq/L (ref 3.5–5.1)
Sodium: 141 mEq/L (ref 135–145)
Total Bilirubin: 0.6 mg/dL (ref 0.2–1.2)
Total Protein: 6.3 g/dL (ref 6.0–8.3)

## 2020-03-29 LAB — LIPID PANEL
Cholesterol: 143 mg/dL (ref 0–200)
HDL: 30.8 mg/dL — ABNORMAL LOW (ref >39.00)
LDL Cholesterol: 85 mg/dL (ref 0–99)
NonHDL: 111.74
Total CHOL/HDL Ratio: 5
Triglycerides: 134 mg/dL (ref 0.0–149.0)
VLDL: 26.8 mg/dL (ref 0.0–40.0)

## 2020-03-29 LAB — MICROALBUMIN / CREATININE URINE RATIO
Creatinine,U: 171.2 mg/dL
Microalb Creat Ratio: 0.4 mg/g (ref 0.0–30.0)
Microalb, Ur: <0.7 mg/dL (ref 0.0–1.9)

## 2020-04-01 ENCOUNTER — Other Ambulatory Visit: Payer: Self-pay

## 2020-04-01 ENCOUNTER — Ambulatory Visit: Payer: Medicare Other

## 2020-04-01 ENCOUNTER — Encounter: Payer: Self-pay | Admitting: Endocrinology

## 2020-04-01 ENCOUNTER — Encounter: Payer: Self-pay | Admitting: Podiatry

## 2020-04-01 ENCOUNTER — Ambulatory Visit (INDEPENDENT_AMBULATORY_CARE_PROVIDER_SITE_OTHER): Payer: Medicare Other | Admitting: Podiatry

## 2020-04-01 ENCOUNTER — Ambulatory Visit (INDEPENDENT_AMBULATORY_CARE_PROVIDER_SITE_OTHER): Payer: Medicare Other | Admitting: Endocrinology

## 2020-04-01 VITALS — BP 104/66 | HR 79 | Ht 69.0 in | Wt 167.8 lb

## 2020-04-01 VITALS — BP 128/76 | HR 84 | Resp 16

## 2020-04-01 DIAGNOSIS — E559 Vitamin D deficiency, unspecified: Secondary | ICD-10-CM

## 2020-04-01 DIAGNOSIS — E782 Mixed hyperlipidemia: Secondary | ICD-10-CM | POA: Diagnosis not present

## 2020-04-01 DIAGNOSIS — E785 Hyperlipidemia, unspecified: Secondary | ICD-10-CM | POA: Diagnosis not present

## 2020-04-01 DIAGNOSIS — M7751 Other enthesopathy of right foot: Secondary | ICD-10-CM

## 2020-04-01 DIAGNOSIS — E119 Type 2 diabetes mellitus without complications: Secondary | ICD-10-CM

## 2020-04-01 DIAGNOSIS — M19079 Primary osteoarthritis, unspecified ankle and foot: Secondary | ICD-10-CM

## 2020-04-01 DIAGNOSIS — M778 Other enthesopathies, not elsewhere classified: Secondary | ICD-10-CM

## 2020-04-01 NOTE — Progress Notes (Signed)
Patient ID: Joseph Sharp, male   DOB: 1951-03-01, 69 y.o.   MRN: [id number]    Reason for Appointment: follow-up for diabetes and blood pressure   History of Present Illness    Diagnosis: Type 2 DIABETES MELITUS, diagnosed in 1977     PAST history: He has had long-standing diabetes managed with multiple drugs and usually well controlled His blood sugar control is somewhat dependent on his compliance with diet and exercise regimen Amaryl was stopped in 2014 because of tendency to hypoglycemia and Prandin was started  In 11 /14 his blood sugars were higher overall and A1c was 7% which was higher than usual Because of this he was started on Trulicity injections, [id number] mg He did benefit overall from starting A999333 mg Trulicity  RECENT history:  His A1c is stable at 6.4   Non-insulin hypoglycemic drugs: Glipizide ER 2.5 mg daily , metformin 2g, pioglitazone 30 mg, Ozempic 0.25 mg weekly  Current management, blood sugar patterns and problems identified:  He has started checking more readings after meals but again usually starts checking his blood sugars a few days before the appointment  He had lost weight previously from better diet but has gained at least 30 pounds since his last visit  He is doing more gardening but also doing a little more exercise with walking recently  Blood sugars are looking fairly good at home and he thinks that they are not from expired test strips  However lab fasting glucose was 153 and he feels that blood sugar rises in the morning after waking up  He is able to afford Ozempic at a reduced dose of 0.25 mg weekly  No side effects with Metformin and no edema with pioglitazone  No hypoglycemia with glipizide ER but he had a recent asymptomatic reading of 64 last night   Side effects from medications: None  Monitors blood glucose:  1+ times a day     Glucometer:  Freestyle        Blood Glucose readings    PRE-MEAL Fasting Lunch  Dinner Bedtime Overall  Glucose range:  94-161  90-118  116-163  64   Mean/median:  118     113   Previous readings:  PRE-MEAL Fasting Lunch Dinner Bedtime Overall  Glucose range:  102-145  86  93  126   Mean/median:     ?   POST-MEAL PC Breakfast PC Lunch PC Dinner  Glucose range:  121  111   Mean/median:                 Diet:  Has a egg and toast at breakfast with small amount of juice and fruit, usually getting low fat meals,  Dinner usually at 7 pm  EXERCISE: Walks 3-4/7  days    Wt Readings from Last 3 Encounters:  04/01/20 167 lb 12.8 oz (76.1 kg)  03/11/20 169 lb (76.7 kg)  11/28/19 164 lb 9.6 oz (74.7 kg)   Lab Results  Component Value Date   HGBA1C 6.4 03/29/2020   HGBA1C 6.3 11/25/2019   HGBA1C 6.3 07/22/2019   Lab Results  Component Value Date   MICROALBUR <0.7 03/29/2020   LDLCALC 85 03/29/2020   CREATININE 1.03 03/29/2020     Allergies as of 04/01/2020   No Known Allergies     Medication List       Accurate as of Apr 01, 2020  9:05 AM. If you have any questions, ask your nurse or doctor.  EpiPen 2-Pak 0.3 mg/0.3 mL Soaj injection Generic drug: EPINEPHrine Inject 0.3 mg into the muscle once as needed (anaphylaxis).   fluticasone 50 MCG/ACT nasal spray Commonly known as: FLONASE Place 1 spray into both nostrils daily.   glipiZIDE 2.5 MG 24 hr tablet Commonly known as: GLUCOTROL XL TAKE 1 TABLET BY MOUTH EVERY DAY WITH BREAKFAST   glucose blood test strip 1 each by Other route as needed for other. Use as instructed to check blood sugar once daily.   metFORMIN 500 MG 24 hr tablet Commonly known as: GLUCOPHAGE-XR TAKE 4 TABLETS BY MOUTH EVERY DAY   Ozempic (0.25 or 0.5 MG/DOSE) 2 MG/1.5ML Sopn Generic drug: Semaglutide(0.25 or 0.5MG /DOS) Inject 0.25 mg into the skin every 7 (seven) days. What changed: Another medication with the same name was removed. Continue taking this medication, and follow the directions you see  here. Changed by: Elayne Snare, MD   pioglitazone 30 MG tablet Commonly known as: ACTOS TAKE 1 TABLET BY MOUTH EVERY DAY   tadalafil 20 MG tablet Commonly known as: Cialis Take 1 tablet (20 mg total) by mouth daily as needed for erectile dysfunction.       Allergies: No Known Allergies  Past Medical History:  Diagnosis Date  . Allergy   . Diabetes mellitus without complication [facility])     Past Surgical History:  Procedure Laterality Date  . EYE SURGERY  01/2013   Eyelid surgery    Family History  Problem Relation Age of Onset  . Diabetes Mother   . Arthritis Mother   . Diabetes Brother   . Cancer Neg Hx   . Heart disease Neg Hx     Social History:  reports that he has never smoked. He has never used smokeless tobacco. He reports that he does not drink alcohol or use drugs.   Review of Systems    Blood pressure is consistently in the normal range without pharmacological therapy    BP Readings from Last 3 Encounters:  04/01/20 104/66  03/11/20 138/90  11/28/19 122/72      HYPERLIPIDEMIA: The lipid abnormality consists of elevated LDL and triglycerides and low HDL LDL previously well controlled with lovastatin Has low HDL from metabolic syndrome and history of high triglycerides  He had stopped lovastatin in 2020  In June 2020 his triglycerides had gone up to 203 and LDL was 95 Although he tried taking the lovastatin again for a month he says it made his legs hurt and he stopped taking this again  His triglycerides normal although HDL is lower LDL still below 100   Lab Results  Component Value Date   CHOL 143 03/29/2020   CHOL 125 07/22/2019   CHOL 150 04/18/2019   Lab Results  Component Value Date   HDL 30.80 (L) 03/29/2020   HDL 35.00 (L) 07/22/2019   HDL 34.60 (L) 04/18/2019   Lab Results  Component Value Date   LDLCALC 85 03/29/2020   LDLCALC 77 07/22/2019   LDLCALC 77 07/09/2018   Lab Results  Component Value Date   TRIG 134.0  03/29/2020   TRIG 66.0 07/22/2019   TRIG 203.0 (H) 04/18/2019   Lab Results  Component Value Date   CHOLHDL 5 03/29/2020   CHOLHDL 4 07/22/2019   CHOLHDL 4 04/18/2019   Lab Results  Component Value Date   LDLDIRECT 95.0 04/18/2019     He takes vitamin D 1000 units OTC as recommended for supplementation  Lab Results  Component Value Date   VD25OH  54.76 03/29/2020   VD25OH 46.46 04/03/2017    He is asking about a small area of numbness on the side of left knee about 3 inches long No tingling or numbness in feet      Examination:   BP 104/66 (BP Location: Left Arm, Patient Position: Sitting, Cuff Size: Normal)   Pulse 79   Ht 5\' 9"  (1.753 m)   Wt 167 lb 12.8 oz (76.1 kg)   SpO2 97%   BMI 24.78 kg/m   Body mass index is 24.78 kg/m.    No ankle edema Plantar surfaces of feet look normal   ASSESSMENT/ PLAN:    DIABETES, non-insulin-dependent:   See history of present illness for detailed discussion of current diabetes management, blood sugar patterns and problems identified  His A1c is stable at 6.4  He has gained back some weight and likely not as consistent with diet He is a little more active but probably can do better with regular walking program Most of his blood sugars are fairly good but lab glucose is higher at 153 likely from dawn phenomenon  Microalbumin normal  Lipids: Has low HDL otherwise fairly good Encouraged him to increase exercise Given list of high saturated fat foods to cut back on  Focal area of numbness on the left knee: Likely a cutaneous nerve injury and may resolve spontaneously  Does not appear to have hypertension, blood pressure better than when he saw his PCP last  History of vitamin D deficiency: Adequately replaced  There are no Patient Instructions on file for this visit.   Elayne Snare 04/01/2020, 9:05 AM   Note: This office note was prepared with Dragon voice recognition system technology. Any transcriptional errors that  result from this process are unintentional.

## 2020-04-01 NOTE — Patient Instructions (Signed)
Check blood sugars on waking up 3 days a week  Also check blood sugars about 2 hours after meals and do this after different meals by rotation  Recommended blood sugar levels on waking up are 90-130 and about 2 hours after meal is 130-160  Please bring your blood sugar monitor to each visit, thank you  Walk daily 

## 2020-04-01 NOTE — Progress Notes (Signed)
Subjective:  Patient ID: Joseph Sharp, male    DOB: Oct 05, 1951,  MRN: [id number] HPI Chief Complaint  Patient presents with  . Toe Injury    3rd toe right - stumped toe several months ago, still tender and swollen, never really felt completely better  . New Patient (Initial Visit)    69 y.o. male presents with the above complaint.   ROS: Denies fever chills nausea vomiting muscle aches pains calf pain back pain chest pain shortness of breath.  Past Medical History:  Diagnosis Date  . Allergy   . Diabetes mellitus without complication [facility])    Past Surgical History:  Procedure Laterality Date  . EYE SURGERY  01/2013   Eyelid surgery    Current Outpatient Medications:  .  EPIPEN 2-PAK 0.3 MG/0.3ML SOAJ injection, Inject 0.3 mg into the muscle once as needed (anaphylaxis). , Disp: , Rfl:  .  fluticasone (FLONASE) 50 MCG/ACT nasal spray, Place 1 spray into both nostrils daily. , Disp: , Rfl:  .  glipiZIDE (GLUCOTROL XL) 2.5 MG 24 hr tablet, TAKE 1 TABLET BY MOUTH EVERY DAY WITH BREAKFAST, Disp: 90 tablet, Rfl: 1 .  glucose blood test strip, 1 each by Other route as needed for other. Use as instructed to check blood sugar once daily., Disp: 100 each, Rfl: 3 .  metFORMIN (GLUCOPHAGE-XR) 500 MG 24 hr tablet, TAKE 4 TABLETS BY MOUTH EVERY DAY, Disp: 360 tablet, Rfl: 0 .  pioglitazone (ACTOS) 30 MG tablet, TAKE 1 TABLET BY MOUTH EVERY DAY, Disp: 90 tablet, Rfl: 1 .  Semaglutide,0.25 or 0.5MG /DOS, (OZEMPIC, 0.25 OR 0.5 MG/DOSE,) 2 MG/1.5ML SOPN, Inject 0.25 mg into the skin every 7 (seven) days., Disp: , Rfl:  .  tadalafil (CIALIS) 20 MG tablet, Take 1 tablet (20 mg total) by mouth daily as needed for erectile dysfunction., Disp: 5 tablet, Rfl: 3  No Known Allergies Review of Systems Objective:   Vitals:   04/01/20 1335  BP: 128/76  Pulse: 84  Resp: 16    General: Well developed, nourished, in no acute distress, alert and oriented x3   Dermatological: Skin is warm, dry  and supple bilateral. Nails x 10 are well maintained; remaining integument appears unremarkable at this time. There are no open sores, no preulcerative lesions, no rash or signs of infection present.  Vascular: Dorsalis Pedis artery and Posterior Tibial artery pedal pulses are 2/4 bilateral with immedate capillary fill time. Pedal hair growth present. No varicosities and no lower extremity edema present bilateral.   Neruologic: Grossly intact via light touch bilateral. Vibratory intact via tuning fork bilateral. Protective threshold with Semmes Wienstein monofilament intact to all pedal sites bilateral. Patellar and Achilles deep tendon reflexes 2+ bilateral. No Babinski or clonus noted bilateral.   Musculoskeletal: No gross boney pedal deformities bilateral. No pain, crepitus, or limitation noted with foot and ankle range of motion bilateral. Muscular strength 5/5 in all groups tested bilateral.  Pain and swelling with mild erythema overlying the DIPJ of the third digit right foot.  Gait: Unassisted, Nonantalgic.    Radiographs:  Radiographs were reviewed previously taken by primary care provider demonstrates significant osteoarthritis with new spurring around the dorsal and medial aspect with soft tissue swelling.  I see no fractures of the distal intermediate or proximal phalanges to the third toe.  Assessment & Plan:   Assessment: Osteoarthritis DIPJ third digit right  Plan: Discussed etiology pathology conservative versus surgical therapies.  Offered him an injection which he declined.  Offered him surgical  intervention which she declined.  Recommended Voltaren gel to be applied up to 4 times a day follow instructions on the box.  Suggest that he follow-up with [place] if he did not have resolution of his symptoms.  I also showed him how to wrap the toe with a 1 inch Coban to help with the swelling.  He does not wear this at nighttime.     Joseph Sharp, [place]

## 2020-05-04 ENCOUNTER — Other Ambulatory Visit: Payer: Self-pay | Admitting: Endocrinology

## 2020-06-03 ENCOUNTER — Telehealth: Payer: Self-pay | Admitting: *Deleted

## 2020-06-03 NOTE — Telephone Encounter (Signed)
AWV  telephone 

## 2020-06-10 ENCOUNTER — Telehealth: Payer: Self-pay | Admitting: Family Medicine

## 2020-06-10 NOTE — Telephone Encounter (Signed)
Pt returning Julies call  to schedule awv .Marland KitchenEllis Parents try him back again tomorrow

## 2020-06-25 DIAGNOSIS — Z20828 Contact with and (suspected) exposure to other viral communicable diseases: Secondary | ICD-10-CM | POA: Diagnosis not present

## 2020-07-02 DIAGNOSIS — D1801 Hemangioma of skin and subcutaneous tissue: Secondary | ICD-10-CM | POA: Diagnosis not present

## 2020-07-02 DIAGNOSIS — Z85828 Personal history of other malignant neoplasm of skin: Secondary | ICD-10-CM | POA: Diagnosis not present

## 2020-07-02 DIAGNOSIS — D2262 Melanocytic nevi of left upper limb, including shoulder: Secondary | ICD-10-CM | POA: Diagnosis not present

## 2020-07-02 DIAGNOSIS — D2261 Melanocytic nevi of right upper limb, including shoulder: Secondary | ICD-10-CM | POA: Diagnosis not present

## 2020-07-02 DIAGNOSIS — D225 Melanocytic nevi of trunk: Secondary | ICD-10-CM | POA: Diagnosis not present

## 2020-07-02 DIAGNOSIS — D235 Other benign neoplasm of skin of trunk: Secondary | ICD-10-CM | POA: Diagnosis not present

## 2020-07-02 DIAGNOSIS — L821 Other seborrheic keratosis: Secondary | ICD-10-CM | POA: Diagnosis not present

## 2020-07-02 DIAGNOSIS — L814 Other melanin hyperpigmentation: Secondary | ICD-10-CM | POA: Diagnosis not present

## 2020-07-02 DIAGNOSIS — D224 Melanocytic nevi of scalp and neck: Secondary | ICD-10-CM | POA: Diagnosis not present

## 2020-07-02 DIAGNOSIS — L570 Actinic keratosis: Secondary | ICD-10-CM | POA: Diagnosis not present

## 2020-08-02 ENCOUNTER — Other Ambulatory Visit: Payer: Medicare Other

## 2020-08-02 ENCOUNTER — Telehealth: Payer: Self-pay | Admitting: Family Medicine

## 2020-08-02 NOTE — Telephone Encounter (Signed)
Please schedule appointment for wellness exam.  Last wellness exam in 2019, would like to review statin recommendations with diabetics as well, even with his normal cholesterol.  He can also review this with his endocrinologist.

## 2020-08-03 ENCOUNTER — Other Ambulatory Visit (INDEPENDENT_AMBULATORY_CARE_PROVIDER_SITE_OTHER): Payer: Medicare Other

## 2020-08-03 ENCOUNTER — Other Ambulatory Visit: Payer: Self-pay

## 2020-08-03 DIAGNOSIS — E119 Type 2 diabetes mellitus without complications: Secondary | ICD-10-CM

## 2020-08-03 LAB — BASIC METABOLIC PANEL
BUN: 28 mg/dL — ABNORMAL HIGH (ref 6–23)
CO2: 25 mEq/L (ref 19–32)
Calcium: 10 mg/dL (ref 8.4–10.5)
Chloride: 106 mEq/L (ref 96–112)
Creatinine, Ser: 1.29 mg/dL (ref 0.40–1.50)
GFR: 55.47 mL/min — ABNORMAL LOW (ref >60.00)
Glucose, Bld: 134 mg/dL — ABNORMAL HIGH (ref 70–99)
Potassium: 4.2 mEq/L (ref 3.5–5.1)
Sodium: 140 mEq/L (ref 135–145)

## 2020-08-03 LAB — HEMOGLOBIN A1C: Hgb A1c MFr Bld: 6.4 % (ref 4.6–6.5)

## 2020-08-03 NOTE — Telephone Encounter (Signed)
Pt is needing to sch AWV

## 2020-08-05 ENCOUNTER — Other Ambulatory Visit: Payer: Self-pay

## 2020-08-05 ENCOUNTER — Encounter (INDEPENDENT_AMBULATORY_CARE_PROVIDER_SITE_OTHER): Payer: Self-pay

## 2020-08-05 ENCOUNTER — Ambulatory Visit (INDEPENDENT_AMBULATORY_CARE_PROVIDER_SITE_OTHER): Payer: Medicare Other | Admitting: Endocrinology

## 2020-08-05 ENCOUNTER — Encounter: Payer: Self-pay | Admitting: Family Medicine

## 2020-08-05 ENCOUNTER — Encounter: Payer: Self-pay | Admitting: Endocrinology

## 2020-08-05 VITALS — BP 126/76 | HR 82 | Ht 69.0 in | Wt 165.0 lb

## 2020-08-05 DIAGNOSIS — N289 Disorder of kidney and ureter, unspecified: Secondary | ICD-10-CM

## 2020-08-05 DIAGNOSIS — E119 Type 2 diabetes mellitus without complications: Secondary | ICD-10-CM | POA: Diagnosis not present

## 2020-08-05 DIAGNOSIS — E782 Mixed hyperlipidemia: Secondary | ICD-10-CM

## 2020-08-05 NOTE — Progress Notes (Signed)
Patient ID: Joseph Sharp, male   DOB: 1951/07/16, 69 y.o.   MRN: [id number]    Reason for Appointment: follow-up for diabetes and blood pressure   History of Present Illness    Diagnosis: Type 2 DIABETES MELITUS, diagnosed in 1977     PAST history: He has had long-standing diabetes managed with multiple drugs and usually well controlled His blood sugar control is somewhat dependent on his compliance with diet and exercise regimen Amaryl was stopped in 2014 because of tendency to hypoglycemia and Prandin was started  In 11 /14 his blood sugars were higher overall and A1c was 7% which was higher than usual Because of this he was started on Trulicity injections, 5.46 mg He did benefit overall from starting 2.70 mg Trulicity  RECENT history:  His A1c is stable at 6.4   Non-insulin hypoglycemic drugs: Glipizide ER 2.5 mg daily , metformin 2g, pioglitazone 30 mg, Ozempic 0.25 mg weekly  Current management, blood sugar patterns and problems identified:  He has been checking his blood sugars only periodically as before  Follow his blood sugars are excellent he has had only 1 high reading after breakfast, most of his blood sugars were done during 3 to 4 days last week  His weight is slowly improving  He is generally working outside on his yard work and staying fairly active  Also he thinks he is planning his meals well.    No hypoglycemia with his glipizide  However lab fasting glucose was 134  Side effects from medications: None  Monitors blood glucose:  1+ times a day     Glucometer:  Freestyle        Blood Glucose readings   RANGE 93-180 AVERAGE 121 overall with fasting average 109  Previous readings:  PRE-MEAL Fasting Lunch Dinner Bedtime Overall  Glucose range:  94-161  90-118  116-163  64   Mean/median:  118     113             Diet:  Has a egg and toast at breakfast with small amount of juice and fruit, usually getting low fat meals,  Dinner  usually at 7 pm  EXERCISE: Walks 3-4/7  days    Wt Readings from Last 3 Encounters:  08/05/20 165 lb (74.8 kg)  04/01/20 167 lb 12.8 oz (76.1 kg)  03/11/20 169 lb (76.7 kg)   Lab Results  Component Value Date   HGBA1C 6.4 08/03/2020   HGBA1C 6.4 03/29/2020   HGBA1C 6.3 11/25/2019   Lab Results  Component Value Date   MICROALBUR <0.7 03/29/2020   LDLCALC 85 03/29/2020   CREATININE 1.29 08/03/2020     Allergies as of 08/05/2020   No Known Allergies     Medication List       Accurate as of August 05, 2020  8:38 AM. If you have any questions, ask your nurse or doctor.        EpiPen 2-Pak 0.3 mg/0.3 mL Soaj injection Generic drug: EPINEPHrine Inject 0.3 mg into the muscle once as needed (anaphylaxis).   fluticasone 50 MCG/ACT nasal spray Commonly known as: FLONASE Place 1 spray into both nostrils daily.   glipiZIDE 2.5 MG 24 hr tablet Commonly known as: GLUCOTROL XL TAKE 1 TABLET BY MOUTH EVERY DAY WITH BREAKFAST   glucose blood test strip 1 each by Other route as needed for other. Use as instructed to check blood sugar once daily.   metFORMIN 500 MG 24 hr tablet Commonly known  as: GLUCOPHAGE-XR TAKE 4 TABLETS BY MOUTH EVERY DAY   Ozempic (0.25 or 0.5 MG/DOSE) 2 MG/1.5ML Sopn Generic drug: Semaglutide(0.25 or 0.5MG /DOS) Inject 0.25 mg into the skin every 7 (seven) days.   pioglitazone 30 MG tablet Commonly known as: ACTOS TAKE 1 TABLET BY MOUTH EVERY DAY   tadalafil 20 MG tablet Commonly known as: Cialis Take 1 tablet (20 mg total) by mouth daily as needed for erectile dysfunction.       Allergies: No Known Allergies  Past Medical History:  Diagnosis Date   Allergy    Diabetes mellitus without complication [facility])     Past Surgical History:  Procedure Laterality Date   EYE SURGERY  01/2013   Eyelid surgery    Family History  Problem Relation Age of Onset   Diabetes Mother    Arthritis Mother    Diabetes Brother    Cancer Neg Hx     Heart disease Neg Hx     Social History:  reports that he has never smoked. He has never used smokeless tobacco. He reports that he does not drink alcohol and does not use drugs.   Review of Systems    Blood pressure is consistently in the normal range without pharmacological therapy   BP Readings from Last 3 Encounters:  08/05/20 128/86  04/01/20 128/76  04/01/20 104/66   CREATININE level is slightly higher, he has not taken any nonsteroidal drugs OTC and has not had any clinical dehydration  Lab Results  Component Value Date   CREATININE 1.29 08/03/2020   CREATININE 1.03 03/29/2020   CREATININE 1.04 11/25/2019     HYPERLIPIDEMIA: The lipid abnormality consists of elevated LDL and triglycerides and low HDL LDL previously well controlled with lovastatin Has low HDL from metabolic syndrome and history of high triglycerides  He had stopped lovastatin in 2020  In June 2020 his triglycerides had gone up to 203 and LDL was 95 Although he tried taking the lovastatin again for a month he says it made his legs hurt and he stopped taking this again  His triglycerides normal although HDL is lower LDL was last below 100   Lab Results  Component Value Date   CHOL 143 03/29/2020   CHOL 125 07/22/2019   CHOL 150 04/18/2019   Lab Results  Component Value Date   HDL 30.80 (L) 03/29/2020   HDL 35.00 (L) 07/22/2019   HDL 34.60 (L) 04/18/2019   Lab Results  Component Value Date   LDLCALC 85 03/29/2020   LDLCALC 77 07/22/2019   LDLCALC 77 07/09/2018   Lab Results  Component Value Date   TRIG 134.0 03/29/2020   TRIG 66.0 07/22/2019   TRIG 203.0 (H) 04/18/2019   Lab Results  Component Value Date   CHOLHDL 5 03/29/2020   CHOLHDL 4 07/22/2019   CHOLHDL 4 04/18/2019   Lab Results  Component Value Date   LDLDIRECT 95.0 04/18/2019     He takes vitamin D 1000 units OTC as recommended for supplementation with adequate levels  Lab Results  Component Value Date    VD25OH 54.76 03/29/2020   VD25OH 46.46 04/03/2017          Examination:   BP 128/86    Pulse 82    Ht 5\' 9"  (1.753 m)    Wt 165 lb (74.8 kg)    SpO2 96%    BMI 24.37 kg/m   Body mass index is 24.37 kg/m.      ASSESSMENT/ PLAN:    DIABETES,  non-insulin-dependent:   See history of present illness for detailed discussion of current diabetes management, blood sugar patterns and problems identified  His A1c is stable at 6.4  He has slowly lost weight He is doing fairly well with activity level during summer and eating relatively healthy Has benefited even with 0.25 mg Ozempic and he wants to continue this to reduce the cost  Blood pressure: Second measurement was normal  Renal dysfunction: No etiology evident as discussed above, no microalbuminuria May need more hydration.  He will follow-up in 1 month  There are no Patient Instructions on file for this visit.   Elayne Snare 08/05/2020, 8:38 AM   Note: This office note was prepared with Dragon voice recognition system technology. Any transcriptional errors that result from this process are unintentional.

## 2020-08-12 DIAGNOSIS — Z23 Encounter for immunization: Secondary | ICD-10-CM | POA: Diagnosis not present

## 2020-08-24 ENCOUNTER — Telehealth: Payer: Self-pay | Admitting: *Deleted

## 2020-08-24 NOTE — Telephone Encounter (Signed)
Schedule AWV.  

## 2020-09-07 ENCOUNTER — Other Ambulatory Visit: Payer: Self-pay | Admitting: Endocrinology

## 2020-09-08 ENCOUNTER — Other Ambulatory Visit (INDEPENDENT_AMBULATORY_CARE_PROVIDER_SITE_OTHER): Payer: Medicare Other

## 2020-09-08 ENCOUNTER — Other Ambulatory Visit: Payer: Self-pay

## 2020-09-08 DIAGNOSIS — N289 Disorder of kidney and ureter, unspecified: Secondary | ICD-10-CM | POA: Diagnosis not present

## 2020-09-08 LAB — BASIC METABOLIC PANEL
BUN: 17 mg/dL (ref 6–23)
CO2: 27 mEq/L (ref 19–32)
Calcium: 9.6 mg/dL (ref 8.4–10.5)
Chloride: 107 mEq/L (ref 96–112)
Creatinine, Ser: 1.09 mg/dL (ref 0.40–1.50)
GFR: 70.18 mL/min (ref >60.00)
Glucose, Bld: 147 mg/dL — ABNORMAL HIGH (ref 70–99)
Potassium: 4.6 mEq/L (ref 3.5–5.1)
Sodium: 140 mEq/L (ref 135–145)

## 2020-10-14 LAB — HM DIABETES EYE EXAM

## 2020-10-15 DIAGNOSIS — Z1159 Encounter for screening for other viral diseases: Secondary | ICD-10-CM | POA: Diagnosis not present

## 2020-10-20 DIAGNOSIS — Z1211 Encounter for screening for malignant neoplasm of colon: Secondary | ICD-10-CM | POA: Diagnosis not present

## 2020-10-20 DIAGNOSIS — D123 Benign neoplasm of transverse colon: Secondary | ICD-10-CM | POA: Diagnosis not present

## 2020-10-20 DIAGNOSIS — K5730 Diverticulosis of large intestine without perforation or abscess without bleeding: Secondary | ICD-10-CM | POA: Diagnosis not present

## 2020-10-20 DIAGNOSIS — D128 Benign neoplasm of rectum: Secondary | ICD-10-CM | POA: Diagnosis not present

## 2020-10-20 DIAGNOSIS — K648 Other hemorrhoids: Secondary | ICD-10-CM | POA: Diagnosis not present

## 2020-10-20 LAB — HM COLONOSCOPY

## 2020-10-21 ENCOUNTER — Other Ambulatory Visit: Payer: Self-pay | Admitting: Endocrinology

## 2020-10-22 DIAGNOSIS — D123 Benign neoplasm of transverse colon: Secondary | ICD-10-CM | POA: Diagnosis not present

## 2020-10-22 DIAGNOSIS — D128 Benign neoplasm of rectum: Secondary | ICD-10-CM | POA: Diagnosis not present

## 2020-12-08 ENCOUNTER — Other Ambulatory Visit: Payer: Self-pay | Admitting: Endocrinology

## 2020-12-11 ENCOUNTER — Other Ambulatory Visit: Payer: Self-pay | Admitting: Endocrinology

## 2021-02-01 ENCOUNTER — Other Ambulatory Visit: Payer: Medicare Other

## 2021-02-03 ENCOUNTER — Ambulatory Visit: Payer: Medicare Other | Admitting: Endocrinology

## 2021-02-08 ENCOUNTER — Other Ambulatory Visit: Payer: Self-pay | Admitting: Endocrinology

## 2021-02-24 ENCOUNTER — Other Ambulatory Visit: Payer: Self-pay

## 2021-02-24 ENCOUNTER — Telehealth: Payer: Self-pay | Admitting: Endocrinology

## 2021-02-24 ENCOUNTER — Other Ambulatory Visit (INDEPENDENT_AMBULATORY_CARE_PROVIDER_SITE_OTHER): Payer: Medicare Other

## 2021-02-24 ENCOUNTER — Other Ambulatory Visit: Payer: Self-pay | Admitting: Endocrinology

## 2021-02-24 DIAGNOSIS — E782 Mixed hyperlipidemia: Secondary | ICD-10-CM | POA: Diagnosis not present

## 2021-02-24 DIAGNOSIS — E119 Type 2 diabetes mellitus without complications: Secondary | ICD-10-CM

## 2021-02-24 LAB — LIPID PANEL
Cholesterol: 139 mg/dL (ref 0–200)
HDL: 36.5 mg/dL — ABNORMAL LOW (ref >39.00)
LDL Cholesterol: 79 mg/dL (ref 0–99)
NonHDL: 102.14
Total CHOL/HDL Ratio: 4
Triglycerides: 114 mg/dL (ref 0.0–149.0)
VLDL: 22.8 mg/dL (ref 0.0–40.0)

## 2021-02-24 LAB — COMPREHENSIVE METABOLIC PANEL
ALT: 16 U/L (ref 0–53)
AST: 15 U/L (ref 0–37)
Albumin: 4 g/dL (ref 3.5–5.2)
Alkaline Phosphatase: 57 U/L (ref 39–117)
BUN: 21 mg/dL (ref 6–23)
CO2: 25 mEq/L (ref 19–32)
Calcium: 9.6 mg/dL (ref 8.4–10.5)
Chloride: 108 mEq/L (ref 96–112)
Creatinine, Ser: 1 mg/dL (ref 0.40–1.50)
GFR: 77.57 mL/min (ref >60.00)
Glucose, Bld: 148 mg/dL — ABNORMAL HIGH (ref 70–99)
Potassium: 4.1 mEq/L (ref 3.5–5.1)
Sodium: 140 mEq/L (ref 135–145)
Total Bilirubin: 0.5 mg/dL (ref 0.2–1.2)
Total Protein: 6.8 g/dL (ref 6.0–8.3)

## 2021-02-24 LAB — MICROALBUMIN / CREATININE URINE RATIO
Creatinine,U: 130 mg/dL
Microalb Creat Ratio: 0.5 mg/g (ref 0.0–30.0)
Microalb, Ur: <0.7 mg/dL (ref 0.0–1.9)

## 2021-02-24 LAB — HEMOGLOBIN A1C: Hgb A1c MFr Bld: 6.6 % — ABNORMAL HIGH (ref 4.6–6.5)

## 2021-02-24 NOTE — Telephone Encounter (Signed)
New message     Patient drop off medication management for Dr. Dwyane Dee.

## 2021-02-28 ENCOUNTER — Encounter: Payer: Self-pay | Admitting: Endocrinology

## 2021-02-28 ENCOUNTER — Other Ambulatory Visit: Payer: Self-pay

## 2021-02-28 ENCOUNTER — Telehealth (INDEPENDENT_AMBULATORY_CARE_PROVIDER_SITE_OTHER): Payer: Medicare Other | Admitting: Endocrinology

## 2021-02-28 DIAGNOSIS — E790 Hyperuricemia without signs of inflammatory arthritis and tophaceous disease: Secondary | ICD-10-CM | POA: Diagnosis not present

## 2021-02-28 DIAGNOSIS — E559 Vitamin D deficiency, unspecified: Secondary | ICD-10-CM | POA: Diagnosis not present

## 2021-02-28 DIAGNOSIS — E1165 Type 2 diabetes mellitus with hyperglycemia: Secondary | ICD-10-CM

## 2021-02-28 DIAGNOSIS — E785 Hyperlipidemia, unspecified: Secondary | ICD-10-CM | POA: Diagnosis not present

## 2021-02-28 NOTE — Telephone Encounter (Signed)
  Date Time Reading Notes       02/27/2021 1012PM 110   02/27/2021 241PM 139   02/27/2021 744AM 135   02/23/2021 703AM 111   02/22/2021 936PM 108   02/22/2021 808AM 152   02/21/2021 944PM 1258   02/21/2021 612PM 133   02/21/2021 701AM 142   02/19/2021 71AM 125   02/18/2021 81PM 121    02/18/2021 1101AM 140   02/18/2021 736AM 162

## 2021-02-28 NOTE — Progress Notes (Signed)
Patient ID: Joseph Sharp, male   DOB: Dec 22, 1950, 70 y.o.   MRN: [id number]  I connected with the above-named patient by video enabled telemedicine application and verified that I am speaking with the correct person. The patient was explained the limitations of evaluation and management by telemedicine and the availability of in person appointments.  Patient also understood that there may be a patient responsible charge related to this service . Location of the patient: Patient's home . Location of the provider: Physician office Only the patient and myself were participating in the encounter The patient understood the above statements and agreed to proceed.   Reason for Appointment: follow-up for diabetes and blood pressure   History of Present Illness    Diagnosis: Type 2 DIABETES MELITUS, diagnosed in 1977     PAST history: He has had long-standing diabetes managed with multiple drugs and usually well controlled His blood sugar control is somewhat dependent on his compliance with diet and exercise regimen Amaryl was stopped in 2014 because of tendency to hypoglycemia and Prandin was started  In 11 /14 his blood sugars were higher overall and A1c was 7% which was higher than usual Because of this he was started on Trulicity injections, 2.63 mg He did benefit overall from starting 7.85 mg Trulicity  RECENT history:  His A1c is slightly higher at 6.6 compared to 6.4   Non-insulin hypoglycemic drugs: Glipizide ER 2.5 mg daily , metformin 2g, pioglitazone 30 mg, Ozempic 0.25 mg weekly  Current management, blood sugar patterns and problems identified:  He has been checking his blood sugars fairly regularly recently and does not like to check often since he does not like fingersticks and is asking for the freestyle libre also  However his FASTING blood sugars are mostly higher compared to last visit and lab glucose 148  Blood sugars are however appearing to be  generally lower after meals or bedtime  He is not taking the higher dose of Ozempic because of cost issues  Otherwise has no side effects from this  No hypoglycemia with low-dose glipizide  He thinks he is trying to walk every other day  However may be eating out more often in the evenings as before  Weight is reportedly about the same at 166  Side effects from medications: None  Monitors blood glucose:  1+ times a day     Glucometer:  Freestyle        Blood Glucose readings by review of home readings:  Recent range 108-162 FASTING 111-162 and lowest readings are at bedtime  PREVIOUS RANGE 93-180 AVERAGE 121 overall with fasting average 109   Dinner usually at 7 pm  EXERCISE: With walking 3-4/7  days    Wt Readings from Last 3 Encounters:  08/05/20 165 lb (74.8 kg)  04/01/20 167 lb 12.8 oz (76.1 kg)  03/11/20 169 lb (76.7 kg)   Lab Results  Component Value Date   HGBA1C 6.6 (H) 02/24/2021   HGBA1C 6.4 08/03/2020   HGBA1C 6.4 03/29/2020   Lab Results  Component Value Date   MICROALBUR <0.7 02/24/2021   LDLCALC 79 02/24/2021   CREATININE 1.00 02/24/2021     Allergies as of 02/28/2021   No Known Allergies     Medication List       Accurate as of February 28, 2021  8:19 AM. If you have any questions, ask your nurse or doctor.        EpiPen 2-Pak 0.3 mg/0.3 mL Soaj injection  Generic drug: EPINEPHrine Inject 0.3 mg into the muscle once as needed (anaphylaxis).   fluticasone 50 MCG/ACT nasal spray Commonly known as: FLONASE Place 1 spray into both nostrils daily.   glipiZIDE 2.5 MG 24 hr tablet Commonly known as: GLUCOTROL XL TAKE 1 TABLET BY MOUTH EVERY DAY WITH BREAKFAST   glucose blood test strip 1 each by Other route as needed for other. Use as instructed to check blood sugar once daily.   metFORMIN 500 MG 24 hr tablet Commonly known as: GLUCOPHAGE-XR TAKE 4 TABLETS BY MOUTH EVERY DAY   Ozempic (0.25 or 0.5 MG/DOSE) 2 MG/1.5ML Sopn Generic  drug: Semaglutide(0.25 or 0.5MG /DOS) INJECT 0.5 MG INTO THE SKIN ONCE A WEEK.   pioglitazone 30 MG tablet Commonly known as: ACTOS TAKE 1 TABLET BY MOUTH EVERY DAY   tadalafil 20 MG tablet Commonly known as: Cialis Take 1 tablet (20 mg total) by mouth daily as needed for erectile dysfunction.       Allergies: No Known Allergies  Past Medical History:  Diagnosis Date  . Allergy   . Diabetes mellitus without complication [facility])     Past Surgical History:  Procedure Laterality Date  . EYE SURGERY  01/2013   Eyelid surgery    Family History  Problem Relation Age of Onset  . Diabetes Mother   . Arthritis Mother   . Diabetes Brother   . Cancer Neg Hx   . Heart disease Neg Hx     Social History:  reports that he has never smoked. He has never used smokeless tobacco. He reports that he does not drink alcohol and does not use drugs.   Review of Systems    Blood pressure is consistently in the normal range without pharmacological therapy   BP Readings from Last 3 Encounters:  08/05/20 126/76  04/01/20 128/76  04/01/20 104/66   CREATININE level is, he has not taken any nonsteroidal drugs OTC and has not had any clinical dehydration  Lab Results  Component Value Date   CREATININE 1.00 02/24/2021   CREATININE 1.09 09/08/2020   CREATININE 1.29 08/03/2020     HYPERLIPIDEMIA: The lipid abnormality consists of elevated LDL and triglycerides and low HDL LDL previously well controlled with lovastatin Has low HDL from metabolic syndrome and history of high triglycerides  He had stopped lovastatin in 2020  In June 2020 his triglycerides had gone up to 203 and LDL was 95 Although he tried taking the lovastatin again for a month he says it made his legs hurt and he stopped taking this again  His triglycerides have been subsequently normal although HDL is Verio below LDL was last below 100   Lab Results  Component Value Date   CHOL 139 02/24/2021   CHOL 143  03/29/2020   CHOL 125 07/22/2019   Lab Results  Component Value Date   HDL 36.50 (L) 02/24/2021   HDL 30.80 (L) 03/29/2020   HDL 35.00 (L) 07/22/2019   Lab Results  Component Value Date   LDLCALC 79 02/24/2021   LDLCALC 85 03/29/2020   LDLCALC 77 07/22/2019   Lab Results  Component Value Date   TRIG 114.0 02/24/2021   TRIG 134.0 03/29/2020   TRIG 66.0 07/22/2019   Lab Results  Component Value Date   CHOLHDL 4 02/24/2021   CHOLHDL 5 03/29/2020   CHOLHDL 4 07/22/2019   Lab Results  Component Value Date   LDLDIRECT 95.0 04/18/2019     He takes vitamin D 1000 units OTC as recommended  for supplementation with adequate levels  Lab Results  Component Value Date   VD25OH 54.76 03/29/2020   VD25OH 46.46 04/03/2017          Examination:   There were no vitals taken for this visit.  There is no height or weight on file to calculate BMI.      ASSESSMENT/ PLAN:    DIABETES, non-insulin-dependent:   See history of present illness for detailed discussion of current diabetes management, blood sugar patterns and problems identified  His A1c is slightly higher at 6.6 and results were discussed  He has either some progression of his diabetes or relatively higher fasting readings and A1c from change in diet lately He agrees to at least try 0.375 mg of Ozempic since he is concerned about the cost of 0.5 mg weekly Currently unable to increase other medications Also needs to be consistent with diet  Since he cannot afford freestyle libre can monitor his blood sugar 2 or 3 times a week and somewhat more often right before his follow-up in 3 months.  He will alternate fasting and after meals Fasting blood sugar should be ideally under 130  Lipids: Controlled  Discussed his mildly increased calcium from outside labs of 10.3 and since his labs have been consistently showing normal calcium level reassured him that this is not of concern  Also discussed his uric acid level of  7.1, he is asymptomatic and discussed that this is likely only needing dietary changes for management such as cutting back on red meats and rich foods  Renal dysfunction: Resolved, now consistently normal  There are no Patient Instructions on file for this visit.   Elayne Snare 02/28/2021, 8:19 AM   Note: This office note was prepared with Dragon voice recognition system technology. Any transcriptional errors that result from this process are unintentional.

## 2021-03-09 ENCOUNTER — Other Ambulatory Visit: Payer: Self-pay | Admitting: Endocrinology

## 2021-03-11 ENCOUNTER — Other Ambulatory Visit: Payer: Self-pay | Admitting: Endocrinology

## 2021-03-28 DIAGNOSIS — U071 COVID-19: Secondary | ICD-10-CM | POA: Diagnosis not present

## 2021-04-29 DIAGNOSIS — Z23 Encounter for immunization: Secondary | ICD-10-CM | POA: Diagnosis not present

## 2021-05-10 DIAGNOSIS — H35371 Puckering of macula, right eye: Secondary | ICD-10-CM | POA: Diagnosis not present

## 2021-05-10 DIAGNOSIS — H40013 Open angle with borderline findings, low risk, bilateral: Secondary | ICD-10-CM | POA: Diagnosis not present

## 2021-05-10 DIAGNOSIS — H2513 Age-related nuclear cataract, bilateral: Secondary | ICD-10-CM | POA: Diagnosis not present

## 2021-05-10 DIAGNOSIS — Z794 Long term (current) use of insulin: Secondary | ICD-10-CM | POA: Diagnosis not present

## 2021-05-10 DIAGNOSIS — H35363 Drusen (degenerative) of macula, bilateral: Secondary | ICD-10-CM | POA: Diagnosis not present

## 2021-05-10 DIAGNOSIS — E119 Type 2 diabetes mellitus without complications: Secondary | ICD-10-CM | POA: Diagnosis not present

## 2021-05-13 DIAGNOSIS — L03116 Cellulitis of left lower limb: Secondary | ICD-10-CM | POA: Diagnosis not present

## 2021-05-17 ENCOUNTER — Other Ambulatory Visit: Payer: Self-pay | Admitting: Endocrinology

## 2021-05-26 ENCOUNTER — Other Ambulatory Visit: Payer: Self-pay

## 2021-05-26 ENCOUNTER — Other Ambulatory Visit (INDEPENDENT_AMBULATORY_CARE_PROVIDER_SITE_OTHER): Payer: Medicare Other

## 2021-05-26 DIAGNOSIS — E1165 Type 2 diabetes mellitus with hyperglycemia: Secondary | ICD-10-CM | POA: Diagnosis not present

## 2021-05-26 DIAGNOSIS — E559 Vitamin D deficiency, unspecified: Secondary | ICD-10-CM

## 2021-05-26 LAB — BASIC METABOLIC PANEL
BUN: 22 mg/dL (ref 6–23)
CO2: 24 mEq/L (ref 19–32)
Calcium: 9.5 mg/dL (ref 8.4–10.5)
Chloride: 108 mEq/L (ref 96–112)
Creatinine, Ser: 0.98 mg/dL (ref 0.40–1.50)
GFR: 79.33 mL/min (ref >60.00)
Glucose, Bld: 150 mg/dL — ABNORMAL HIGH (ref 70–99)
Potassium: 4 mEq/L (ref 3.5–5.1)
Sodium: 140 mEq/L (ref 135–145)

## 2021-05-26 LAB — HEMOGLOBIN A1C: Hgb A1c MFr Bld: 7.1 % — ABNORMAL HIGH (ref 4.6–6.5)

## 2021-05-26 LAB — VITAMIN D 25 HYDROXY (VIT D DEFICIENCY, FRACTURES): VITD: 66.01 ng/mL (ref 30.00–100.00)

## 2021-05-30 ENCOUNTER — Ambulatory Visit (INDEPENDENT_AMBULATORY_CARE_PROVIDER_SITE_OTHER): Payer: Medicare Other | Admitting: Endocrinology

## 2021-05-30 ENCOUNTER — Encounter: Payer: Self-pay | Admitting: Endocrinology

## 2021-05-30 ENCOUNTER — Other Ambulatory Visit: Payer: Self-pay

## 2021-05-30 VITALS — BP 122/68 | HR 79 | Ht 69.0 in | Wt 167.2 lb

## 2021-05-30 DIAGNOSIS — E1165 Type 2 diabetes mellitus with hyperglycemia: Secondary | ICD-10-CM

## 2021-05-30 DIAGNOSIS — E785 Hyperlipidemia, unspecified: Secondary | ICD-10-CM | POA: Diagnosis not present

## 2021-05-30 MED ORDER — GLIMEPIRIDE 4 MG PO TABS: 4.0000 mg | ORAL_TABLET | Freq: Every day | ORAL | 3 refills | Status: DC

## 2021-05-30 MED ORDER — OZEMPIC (1 MG/DOSE) 2 MG/1.5ML ~~LOC~~ SOPN: 1.0000 mg | PEN_INJECTOR | SUBCUTANEOUS | 2 refills | Status: DC

## 2021-05-30 NOTE — Progress Notes (Signed)
Patient ID: Joseph Sharp, male   DOB: Jun 03, 1951, 70 y.o.   MRN: [id number]   Reason for Appointment: follow-up for diabetes and blood pressure   History of Present Illness    Diagnosis: Type 2 DIABETES MELITUS, diagnosed in 1977     PAST history: He has had long-standing diabetes managed with multiple drugs and usually well controlled His blood sugar control is somewhat dependent on his compliance with diet and exercise regimen Amaryl was stopped in 2014 because of tendency to hypoglycemia and Prandin was started  In 11 /14 his blood sugars were higher overall and A1c was 7% which was higher than usual Because of this he was started on Trulicity injections, 6.60 mg He did benefit overall from starting 6.30 mg Trulicity  RECENT history:  His A1c is progressively higher at 7.1, previously 6.6 compared to 6.4   Non-insulin hypoglycemic drugs: Glipizide ER 2.5 mg daily , metformin 2g, pioglitazone 30 mg, Ozempic 0.25 mg weekly  Current management, blood sugar patterns and problems identified: He has been checking his blood sugars only in the last week out of a whole month As before does not like to do fingersticks  Again his FASTING blood sugars are mostly higher compared to his previous visit Lab glucose 150 fasting  He was told to increase his Ozempic Because of cost he is only dialing up about two thirds of the dose between 0.25 and 0.5 Has no side effects from this Blood sugars tend to be near normal before dinnertime He still is not motivated to do any walking or exercise He is trying to walk  With his wife retiring he may be eating out sometimes and eating differently, last night had pizza Weight is about the same as last year but at home trending somewhat higher lately  Side effects from medications: None  Monitors blood glucose:  1+ times a day     Glucometer:  Freestyle        Blood Glucose readings by review of home readings:   PRE-MEAL Fasting  Lunch Dinner Bedtime Overall  Glucose range: 132-188.  107-132    Mean/median:     156   POST-MEAL PC Breakfast PC Lunch PC Dinner  Glucose range: 172-210  115  Mean/median:  112      PREVIOUS range 108-162 FASTING 111-162 and lowest readings are at bedtime    Dinner usually at 7 pm  EXERCISE: With walking 3-4/7  days    Wt Readings from Last 3 Encounters:  05/30/21 167 lb 3.2 oz (75.8 kg)  08/05/20 165 lb (74.8 kg)  04/01/20 167 lb 12.8 oz (76.1 kg)   Lab Results  Component Value Date   HGBA1C 7.1 (H) 05/26/2021   HGBA1C 6.6 (H) 02/24/2021   HGBA1C 6.4 08/03/2020   Lab Results  Component Value Date   MICROALBUR <0.7 02/24/2021   LDLCALC 79 02/24/2021   CREATININE 0.98 05/26/2021     Allergies as of 05/30/2021   No Known Allergies      Medication List        Accurate as of May 30, 2021  1:56 PM. If you have any questions, ask your nurse or doctor.          STOP taking these medications    EpiPen 2-Pak 0.3 mg/0.3 mL Soaj injection Generic drug: EPINEPHrine Stopped by: Elayne Snare, MD   ergocalciferol 1.25 MG (50000 UT) capsule Commonly known as: VITAMIN D2 Stopped by: Elayne Snare, MD   glipiZIDE 2.5  MG 24 hr tablet Commonly known as: GLUCOTROL XL Stopped by: Elayne Snare, MD   Ozempic (0.25 or 0.5 MG/DOSE) 2 MG/1.5ML Sopn Generic drug: Semaglutide(0.25 or 0.5MG /DOS) Replaced by: Ozempic (1 MG/DOSE) 2 MG/1.5ML Sopn Stopped by: Elayne Snare, MD   tadalafil 20 MG tablet Commonly known as: Cialis Stopped by: Elayne Snare, MD       TAKE these medications    fluticasone 50 MCG/ACT nasal spray Commonly known as: FLONASE Place 1 spray into both nostrils daily.   glimepiride 4 MG tablet Commonly known as: AMARYL Take 1 tablet (4 mg total) by mouth at bedtime. Started by: Elayne Snare, MD   glucose blood test strip 1 each by Other route as needed for other. Use as instructed to check blood sugar once daily.   metFORMIN 500 MG 24 hr  tablet Commonly known as: GLUCOPHAGE-XR TAKE 4 TABLETS BY MOUTH EVERY DAY   Ozempic (1 MG/DOSE) 2 MG/1.5ML Sopn Generic drug: Semaglutide (1 MG/DOSE) Inject 1 mg into the skin once a week. Replaces: Ozempic (0.25 or 0.5 MG/DOSE) 2 MG/1.5ML Sopn Started by: Elayne Snare, MD   pioglitazone 30 MG tablet Commonly known as: ACTOS TAKE 1 TABLET BY MOUTH EVERY DAY   zinc gluconate 50 MG tablet Take by mouth.        Allergies: No Known Allergies  Past Medical History:  Diagnosis Date   Allergy    Diabetes mellitus without complication [facility])     Past Surgical History:  Procedure Laterality Date   EYE SURGERY  01/2013   Eyelid surgery    Family History  Problem Relation Age of Onset   Diabetes Mother    Arthritis Mother    Diabetes Brother    Cancer Neg Hx    Heart disease Neg Hx     Social History:  reports that he has never smoked. He has never used smokeless tobacco. He reports that he does not drink alcohol and does not use drugs.   Review of Systems    Blood pressure is consistently in the normal range without pharmacological therapy   BP Readings from Last 3 Encounters:  05/30/21 122/68  08/05/20 126/76  04/01/20 128/76   CREATININE level stable  Lab Results  Component Value Date   CREATININE 0.98 05/26/2021   CREATININE 1.00 02/24/2021   CREATININE 1.09 09/08/2020     HYPERLIPIDEMIA: The lipid abnormality consists of elevated LDL and triglycerides and low HDL LDL previously well controlled with lovastatin Has low HDL from metabolic syndrome and history of high triglycerides  He had stopped lovastatin in 2020  In June 2020 his triglycerides had gone up to 203 and LDL was 95 Although he tried taking the lovastatin again for a month he says it made his legs hurt and he stopped taking this again  His triglycerides have been subsequently normal  LDL was last below 100   Lab Results  Component Value Date   CHOL 139 02/24/2021   CHOL 143  03/29/2020   CHOL 125 07/22/2019   Lab Results  Component Value Date   HDL 36.50 (L) 02/24/2021   HDL 30.80 (L) 03/29/2020   HDL 35.00 (L) 07/22/2019   Lab Results  Component Value Date   LDLCALC 79 02/24/2021   LDLCALC 85 03/29/2020   LDLCALC 77 07/22/2019   Lab Results  Component Value Date   TRIG 114.0 02/24/2021   TRIG 134.0 03/29/2020   TRIG 66.0 07/22/2019   Lab Results  Component Value Date   CHOLHDL 4  02/24/2021   CHOLHDL 5 03/29/2020   CHOLHDL 4 07/22/2019   Lab Results  Component Value Date   LDLDIRECT 95.0 04/18/2019     He takes vitamin D 1000 units OTC as recommended for supplementation with normal levels  Lab Results  Component Value Date   VD25OH 66.01 05/26/2021   VD25OH 54.76 03/29/2020          Examination:   BP 122/68   Pulse 79   Ht 5\' 9"  (1.753 m)   Wt 167 lb 3.2 oz (75.8 kg)   SpO2 96%   BMI 24.69 kg/m   Body mass index is 24.69 kg/m.    Diabetic Foot Exam - Simple   Simple Foot Form Diabetic Foot exam was performed with the following findings: Yes   Visual Inspection No deformities, no ulcerations, no other skin breakdown bilaterally: Yes Sensation Testing Intact to touch and monofilament testing bilaterally: Yes Pulse Check Posterior Tibialis and Dorsalis pulse intact bilaterally: Yes Comments       ASSESSMENT/ PLAN:    DIABETES, non-insulin-dependent:   See history of present illness for detailed discussion of current diabetes management, blood sugar patterns and problems identified  His A1c is progressively higher at 7.1  He will be tried on Amaryl instead of glipizide and see if fasting blood sugars are better with 2 mg Amaryl at bedtime Otherwise he can go up to 4 mg if fasting blood sugars are not better In the meantime he will start taking 0.5 mg Ozempic and next prescription will be for 1 mg pen With the 1 mg pen he should at least dial two thirds or three quarters of the dose to get better effects May  also consider switching to the 2 mg pen on the next visit Besides the need to check his sugars while adjusting the medication Also need to start walking regularly  Patient Instructions  Walk daily  Check blood sugars on waking up 3 days a week  Also check blood sugars about 2 hours after meals and do this after different meals by rotation  Recommended blood sugar levels on waking up are 90-130 and about 2 hours after meal is 130-160  Please bring your blood sugar monitor to each visit, thank you  Amaryl 4mg  1/2 tab at bedtime and in 1 week then go to 1 pill ( replaces glipizide)  Ozempic 0.5mg  for 2 doses then 2/3 or 3/4 of 1mg  pen   Elayne Snare 05/30/2021, 1:56 PM   Note: This office note was prepared with Dragon voice recognition system technology. Any transcriptional errors that result from this process are unintentional.

## 2021-05-30 NOTE — Patient Instructions (Signed)
Walk daily  Check blood sugars on waking up 3 days a week  Also check blood sugars about 2 hours after meals and do this after different meals by rotation  Recommended blood sugar levels on waking up are 90-130 and about 2 hours after meal is 130-160  Please bring your blood sugar monitor to each visit, thank you  Amaryl 4mg  1/2 tab at bedtime and in 1 week then go to 1 pill ( replaces glipizide)  Ozempic 0.5mg  for 2 doses then 2/3 or 3/4 of 1mg  pen

## 2021-06-23 ENCOUNTER — Ambulatory Visit
Admission: EM | Admit: 2021-06-23 | Discharge: 2021-06-23 | Disposition: A | Discharge status: Home / Self Care | Payer: Medicare Other

## 2021-06-23 ENCOUNTER — Other Ambulatory Visit: Payer: Self-pay

## 2021-07-26 ENCOUNTER — Other Ambulatory Visit: Payer: Self-pay | Admitting: Endocrinology

## 2021-07-26 DIAGNOSIS — L298 Other pruritus: Secondary | ICD-10-CM | POA: Diagnosis not present

## 2021-07-26 DIAGNOSIS — D485 Neoplasm of uncertain behavior of skin: Secondary | ICD-10-CM | POA: Diagnosis not present

## 2021-07-26 DIAGNOSIS — D1801 Hemangioma of skin and subcutaneous tissue: Secondary | ICD-10-CM | POA: Diagnosis not present

## 2021-07-26 DIAGNOSIS — D225 Melanocytic nevi of trunk: Secondary | ICD-10-CM | POA: Diagnosis not present

## 2021-07-26 DIAGNOSIS — L814 Other melanin hyperpigmentation: Secondary | ICD-10-CM | POA: Diagnosis not present

## 2021-07-26 DIAGNOSIS — Z85828 Personal history of other malignant neoplasm of skin: Secondary | ICD-10-CM | POA: Diagnosis not present

## 2021-07-26 DIAGNOSIS — L821 Other seborrheic keratosis: Secondary | ICD-10-CM | POA: Diagnosis not present

## 2021-07-26 DIAGNOSIS — L905 Scar conditions and fibrosis of skin: Secondary | ICD-10-CM | POA: Diagnosis not present

## 2021-07-26 DIAGNOSIS — D2262 Melanocytic nevi of left upper limb, including shoulder: Secondary | ICD-10-CM | POA: Diagnosis not present

## 2021-08-21 DIAGNOSIS — Z23 Encounter for immunization: Secondary | ICD-10-CM | POA: Diagnosis not present

## 2021-08-25 ENCOUNTER — Other Ambulatory Visit (INDEPENDENT_AMBULATORY_CARE_PROVIDER_SITE_OTHER): Payer: Medicare Other

## 2021-08-25 ENCOUNTER — Other Ambulatory Visit: Payer: Self-pay

## 2021-08-25 DIAGNOSIS — E785 Hyperlipidemia, unspecified: Secondary | ICD-10-CM | POA: Diagnosis not present

## 2021-08-25 DIAGNOSIS — E1165 Type 2 diabetes mellitus with hyperglycemia: Secondary | ICD-10-CM

## 2021-08-25 LAB — COMPREHENSIVE METABOLIC PANEL
ALT: 15 U/L (ref 0–53)
AST: 17 U/L (ref 0–37)
Albumin: 4 g/dL (ref 3.5–5.2)
Alkaline Phosphatase: 56 U/L (ref 39–117)
BUN: 20 mg/dL (ref 6–23)
CO2: 29 mEq/L (ref 19–32)
Calcium: 10.3 mg/dL (ref 8.4–10.5)
Chloride: 108 mEq/L (ref 96–112)
Creatinine, Ser: 1.01 mg/dL (ref 0.40–1.50)
GFR: 76.38 mL/min (ref >60.00)
Glucose, Bld: 127 mg/dL — ABNORMAL HIGH (ref 70–99)
Potassium: 4.1 mEq/L (ref 3.5–5.1)
Sodium: 142 mEq/L (ref 135–145)
Total Bilirubin: 0.4 mg/dL (ref 0.2–1.2)
Total Protein: 6.4 g/dL (ref 6.0–8.3)

## 2021-08-25 LAB — LIPID PANEL
Cholesterol: 144 mg/dL (ref 0–200)
HDL: 36.4 mg/dL — ABNORMAL LOW (ref >39.00)
LDL Cholesterol: 86 mg/dL (ref 0–99)
NonHDL: 107.68
Total CHOL/HDL Ratio: 4
Triglycerides: 107 mg/dL (ref 0.0–149.0)
VLDL: 21.4 mg/dL (ref 0.0–40.0)

## 2021-08-25 LAB — HEMOGLOBIN A1C: Hgb A1c MFr Bld: 6.6 % — ABNORMAL HIGH (ref 4.6–6.5)

## 2021-08-30 ENCOUNTER — Encounter: Payer: Self-pay | Admitting: Endocrinology

## 2021-08-30 ENCOUNTER — Ambulatory Visit (INDEPENDENT_AMBULATORY_CARE_PROVIDER_SITE_OTHER): Payer: Medicare Other | Admitting: Endocrinology

## 2021-08-30 ENCOUNTER — Other Ambulatory Visit: Payer: Self-pay

## 2021-08-30 VITALS — BP 142/82 | HR 86 | Ht 69.0 in | Wt 170.2 lb

## 2021-08-30 DIAGNOSIS — E119 Type 2 diabetes mellitus without complications: Secondary | ICD-10-CM

## 2021-08-30 NOTE — Progress Notes (Signed)
Patient ID: Joseph Sharp, male   DOB: 1951/10/31, 70 y.o.   MRN: [id number]   Reason for Appointment: follow-up for diabetes and blood pressure   History of Present Illness    Diagnosis: Type 2 DIABETES MELITUS, diagnosed in 1977     PAST history: He has had long-standing diabetes managed with multiple drugs and usually well controlled His blood sugar control is somewhat dependent on his compliance with diet and exercise regimen Amaryl was stopped in 2014 because of tendency to hypoglycemia and Prandin was started  In 11 /14 his blood sugars were higher overall and A1c was 7% which was higher than usual Because of this he was started on Trulicity injections, 7.67 mg He did benefit overall from starting 2.09 mg Trulicity  RECENT history:  His A1c is much better now at 6.6, previously was higher at 7.1 Has been as low as 6.4   Non-insulin hypoglycemic drugs: Amaryl 4 mg at night, metformin 2g, pioglitazone 30 mg, Ozempic 0.5 mg weekly  Current management, blood sugar patterns and problems identified: He has been checking his blood sugars only in the last 4 days and may have checked some after he left on the last visit  He was told to try half tablet of Amaryl and if not adequate go to the whole tablet but he is still taking the whole tablet without monitoring his sugar  His blood sugars are overall much better at all times but primarily has checked fasting readings  Fasting readings are generally better than his last visit when they were as high as 188  He has gained some weight but he thinks this is from recent vacation  He is taking a dose of 0.5 mg Ozempic, dialing halfway on the 1 mg pen out of a whole month As before does not like to do fingersticks Lab glucose was 127 fasting He has done a fair amount of walking, at least 3 days a week Side effects from medications: None  Monitors blood glucose:  1+ times a day     Glucometer:  Freestyle        Blood  Glucose readings by download of meter:  FASTING range 86-138  nonfasting 175 after breakfast, 107 after dinner  PREVIOUSLY:  PRE-MEAL Fasting Lunch Dinner Bedtime Overall  Glucose range: 132-188.  107-132    Mean/median:     156   POST-MEAL PC Breakfast PC Lunch PC Dinner  Glucose range: 172-210  115  Mean/median:  112    Dinner usually at 7 pm  EXERCISE: With walking 3-4/7  days    Wt Readings from Last 3 Encounters:  08/30/21 170 lb 3.2 oz (77.2 kg)  05/30/21 167 lb 3.2 oz (75.8 kg)  08/05/20 165 lb (74.8 kg)   Lab Results  Component Value Date   HGBA1C 6.6 (H) 08/25/2021   HGBA1C 7.1 (H) 05/26/2021   HGBA1C 6.6 (H) 02/24/2021   Lab Results  Component Value Date   MICROALBUR <0.7 02/24/2021   [place] 86 08/25/2021   CREATININE 1.01 08/25/2021     Allergies as of 08/30/2021   No Known Allergies      Medication List        Accurate as of August 30, 2021  9:26 AM. If you have any questions, ask your nurse or doctor.          fluticasone 50 MCG/ACT nasal spray Commonly known as: FLONASE Place 1 spray into both nostrils daily.   glimepiride 4 MG tablet  Commonly known as: AMARYL Take 1 tablet (4 mg total) by mouth at bedtime.   glucose blood test strip 1 each by Other route as needed for other. Use as instructed to check blood sugar once daily.   metFORMIN 500 MG 24 hr tablet Commonly known as: GLUCOPHAGE-XR TAKE 4 TABLETS BY MOUTH EVERY DAY   Ozempic (1 MG/DOSE) 2 MG/1.5ML Sopn Generic drug: Semaglutide (1 MG/DOSE) Inject 1 mg into the skin once a week.   pioglitazone 30 MG tablet Commonly known as: ACTOS TAKE 1 TABLET BY MOUTH EVERY DAY   zinc gluconate 50 MG tablet Take by mouth.        Allergies: No Known Allergies  Past Medical History:  Diagnosis Date   Allergy    Diabetes mellitus without complication [facility])     Past Surgical History:  Procedure Laterality Date   EYE SURGERY  01/2013   Eyelid surgery    Family History   Problem Relation Age of Onset   Diabetes Mother    Arthritis Mother    Diabetes Brother    Cancer Neg Hx    Heart disease Neg Hx     Social History:  reports that he has never smoked. He has never used smokeless tobacco. He reports that he does not drink alcohol and does not use drugs.   Review of Systems    Blood pressure is consistently in the normal range without pharmacological therapy   BP Readings from Last 3 Encounters:  08/30/21 (!) 142/82  05/30/21 122/68  08/05/20 126/76   CREATININE level stable  Lab Results  Component Value Date   CREATININE 1.01 08/25/2021   CREATININE 0.98 05/26/2021   CREATININE 1.00 02/24/2021     HYPERLIPIDEMIA: The lipid abnormality consists of elevated LDL and triglycerides and low HDL LDL previously well controlled with lovastatin Has low HDL from metabolic syndrome and history of high triglycerides  He had stopped lovastatin in 2020  In June 2020 his triglycerides had gone up to 203 and LDL was 95 Although he tried taking the lovastatin again for a month he says it made his legs hurt and he stopped taking this again  His triglycerides have been consistently normal  LDL consistently 100   Lab Results  Component Value Date   CHOL 144 08/25/2021   CHOL 139 02/24/2021   CHOL 143 03/29/2020   Lab Results  Component Value Date   HDL 36.40 (L) 08/25/2021   HDL 36.50 (L) 02/24/2021   HDL 30.80 (L) 03/29/2020   Lab Results  Component Value Date   LDLCALC 86 08/25/2021   LDLCALC 79 02/24/2021   LDLCALC 85 03/29/2020   Lab Results  Component Value Date   TRIG 107.0 08/25/2021   TRIG 114.0 02/24/2021   TRIG 134.0 03/29/2020   Lab Results  Component Value Date   CHOLHDL 4 08/25/2021   CHOLHDL 4 02/24/2021   CHOLHDL 5 03/29/2020   Lab Results  Component Value Date   LDLDIRECT 95.0 04/18/2019     He takes vitamin D 1000 units OTC as recommended for supplementation with normal levels  Lab Results  Component  Value Date   VD25OH 66.01 05/26/2021   VD25OH 54.76 03/29/2020          Examination:   BP (!) 142/82   Pulse 86   Ht 5\' 9"  (1.753 m)   Wt 170 lb 3.2 oz (77.2 kg)   SpO2 98%   BMI 25.13 kg/m   Body mass index is 25.13 kg/m.  ASSESSMENT/ PLAN:    DIABETES, non-insulin-dependent:   See history of present illness for detailed discussion of current diabetes management, blood sugar patterns and problems identified  His A1c is better at 6.6, previously was higher at 7.1  He has done better with Amaryl instead of glipizide and with taking this in the evening his fasting readings are consistently better  However his monitoring at home is inadequate and he has checked only for the last 5 mornings  No hypoglycemia but lowest blood sugar 70 3 in the afternoon  Likely has gained weight recently from vacation Discussed that he may well get hypoglycemic especially if he is consistent with diet and exercise and will only take 2 mg of Amaryl for now He will check his blood sugars more consistently over the next several days and only increase the dose if blood sugars are over 130 consistently  Also periodically check after meals  LIPIDS: Still controlled without statins although may still benefit from statins for cardiovascular risk reduction  Blood pressure is high normal and will continue to follow  Recommended that he make an appointment with his PCP for general physical  There are no Patient Instructions on file for this visit.   Elayne Snare 08/30/2021, 9:26 AM   Note: This office note was prepared with Dragon voice recognition system technology. Any transcriptional errors that result from this process are unintentional.

## 2021-08-30 NOTE — Patient Instructions (Signed)
Try 1/2 Glimeperide at nite  Check blood sugars on waking up 3 days a week  Also check blood sugars about 2 hours after meals and do this after different meals by rotation  Recommended blood sugar levels on waking up are 90-130 and about 2 hours after meal is 130-180  Please bring your blood sugar monitor to each visit, thank you

## 2021-09-27 ENCOUNTER — Other Ambulatory Visit: Payer: Self-pay | Admitting: Endocrinology

## 2021-10-13 ENCOUNTER — Telehealth: Payer: Medicare Other | Admitting: Emergency Medicine

## 2021-10-13 DIAGNOSIS — J019 Acute sinusitis, unspecified: Secondary | ICD-10-CM | POA: Diagnosis not present

## 2021-10-13 DIAGNOSIS — B9689 Other specified bacterial agents as the cause of diseases classified elsewhere: Secondary | ICD-10-CM | POA: Diagnosis not present

## 2021-10-13 MED ORDER — FLUTICASONE PROPIONATE 50 MCG/ACT NA SUSP: 2.0000 | Freq: Every day | NASAL | 0 refills | Status: AC

## 2021-10-13 MED ORDER — AMOXICILLIN-POT CLAVULANATE 875-125 MG PO TABS: 1.0000 | ORAL_TABLET | Freq: Two times a day (BID) | ORAL | 0 refills | Status: DC

## 2021-10-13 NOTE — Patient Instructions (Addendum)
  Joseph Sharp, thank you for joining Carvel Getting, NP for today's virtual visit.  While this provider is not your primary care provider (PCP), if your PCP is located in our provider database this encounter information will be shared with them immediately following your visit.  Consent: (Patient) Joseph Sharp provided verbal consent for this virtual visit at the beginning of the encounter.  Current Medications:  Current Outpatient Medications:    amoxicillin-clavulanate (AUGMENTIN) 875-125 MG tablet, Take 1 tablet by mouth 2 (two) times daily., Disp: 14 tablet, Rfl: 0   fluticasone (FLONASE) 50 MCG/ACT nasal spray, Place 2 sprays into both nostrils daily., Disp: 16 g, Rfl: 0   glimepiride (AMARYL) 4 MG tablet, TAKE 1 TABLET BY MOUTH AT BEDTIME., Disp: 90 tablet, Rfl: 1   glucose blood test strip, 1 each by Other route as needed for other. Use as instructed to check blood sugar once daily., Disp: 100 each, Rfl: 3   metFORMIN (GLUCOPHAGE-XR) 500 MG 24 hr tablet, TAKE 4 TABLETS BY MOUTH EVERY DAY, Disp: 360 tablet, Rfl: 1   pioglitazone (ACTOS) 30 MG tablet, TAKE 1 TABLET BY MOUTH EVERY DAY, Disp: 90 tablet, Rfl: 1   Semaglutide, 1 MG/DOSE, (OZEMPIC, 1 MG/DOSE,) 2 MG/1.5ML SOPN, Inject 1 mg into the skin once a week., Disp: 1.5 mL, Rfl: 2   zinc gluconate 50 MG tablet, Take by mouth., Disp: , Rfl:    Medications ordered in this encounter:  Meds ordered this encounter  Medications   amoxicillin-clavulanate (AUGMENTIN) 875-125 MG tablet    Sig: Take 1 tablet by mouth 2 (two) times daily.    Dispense:  14 tablet    Refill:  0   fluticasone (FLONASE) 50 MCG/ACT nasal spray    Sig: Place 2 sprays into both nostrils daily.    Dispense:  16 g    Refill:  0     *If you need refills on other medications prior to your next appointment, please contact your pharmacy*  Follow-Up: Call back or seek an in-person evaluation if the symptoms worsen or if the condition fails to improve  as anticipated.  Other Instructions Switch from the decongestant nasal spray to the Flonase or fluticasone nasal spray.  I strongly recommend using a saline nasal spray several times a day to try to help that congestion in your nose and your sinuses drain out.  Another medicine that people find helpful for nasal congestion is Mucinex (generic guaifenesin is okay to use).  Drink lots of liquids.   If you have been instructed to have an in-person evaluation today at a local Urgent Care facility, please use the link below. It will take you to a list of all of our available [facility], including address, phone number and hours of operation. Please do not delay care.  [facility]  If you or a family member do not have a primary care provider, use the link below to schedule a visit and establish care. When you choose a [facility] primary care physician or advanced practice provider, you gain a long-term partner in health. Find a Primary Care Provider  Learn more about [facility]'s in-office and virtual care options: [facility]

## 2021-10-13 NOTE — Progress Notes (Signed)
Virtual Visit Consent   Joseph Sharp, you are scheduled for a virtual visit with a [facility] provider today.     Just as with appointments in the office, your consent must be obtained to participate.  Your consent will be active for this visit and any virtual visit you may have with one of our providers in the next 365 days.     If you have a MyChart account, a copy of this consent can be sent to you electronically.  All virtual visits are billed to your insurance company just like a traditional visit in the office.    As this is a virtual visit, video technology does not allow for your provider to perform a traditional examination.  This may limit your provider's ability to fully assess your condition.  If your provider identifies any concerns that need to be evaluated in person or the need to arrange testing (such as labs, EKG, etc.), we will make arrangements to do so.     Although advances in technology are sophisticated, we cannot ensure that it will always work on either your end or our end.  If the connection with a video visit is poor, the visit may have to be switched to a telephone visit.  With either a video or telephone visit, we are not always able to ensure that we have a secure connection.     I need to obtain your verbal consent now.   Are you willing to proceed with your visit today?    NIKOS ANGLEMYER has provided verbal consent on 10/13/2021 for a virtual visit (video or telephone).   Carvel Getting, NP   Date: 10/13/2021 10:07 AM   Virtual Visit via Video Note   I, Carvel Getting, connected with  JULIEN OSCAR  ([id number], 01-24-1951) on 10/13/21 at 10:00 AM EST by a video-enabled telemedicine application and verified that I am speaking with the correct person using two identifiers.  Location: Patient: Virtual Visit Location Patient: Home Provider: Virtual Visit Location Provider: Home Office   I discussed the limitations of evaluation and  management by telemedicine and the availability of in person appointments. The patient expressed understanding and agreed to proceed.    History of Present Illness: Joseph Sharp is a 70 y.o. who identifies as a male who was assigned male at birth, and is being seen today for a potential sinus infection.  He reports he has been mildly ill with what he thought was a cold for the last 6 days.  He was getting better but then began getting worse yesterday and now has thick "heavy" green nasal drainage and is coughing up green sputum.  He does report significant postnasal drainage.  Does not feel short of breath.  Denies fever.  Has been using Robitussin-DM and an over-the-counter nasal decongestant spray to manage his symptoms.  HPI: HPI  Problems:  Patient Active Problem List   Diagnosis Date Noted   ED (erectile dysfunction) of organic origin 09/04/2014   Allergic rhinitis 08/10/2014   Diabetes mellitus, stable ([place]) 06/17/2013   Pure hypercholesterolemia 06/17/2013    Allergies: No Known Allergies Medications:  Current Outpatient Medications:    amoxicillin-clavulanate (AUGMENTIN) 875-125 MG tablet, Take 1 tablet by mouth 2 (two) times daily., Disp: 14 tablet, Rfl: 0   fluticasone (FLONASE) 50 MCG/ACT nasal spray, Place 2 sprays into both nostrils daily., Disp: 16 g, Rfl: 0   glimepiride (AMARYL) 4 MG tablet, TAKE 1 TABLET BY MOUTH AT  BEDTIME., Disp: 90 tablet, Rfl: 1   glucose blood test strip, 1 each by Other route as needed for other. Use as instructed to check blood sugar once daily., Disp: 100 each, Rfl: 3   metFORMIN (GLUCOPHAGE-XR) 500 MG 24 hr tablet, TAKE 4 TABLETS BY MOUTH EVERY DAY, Disp: 360 tablet, Rfl: 1   pioglitazone (ACTOS) 30 MG tablet, TAKE 1 TABLET BY MOUTH EVERY DAY, Disp: 90 tablet, Rfl: 1   Semaglutide, 1 MG/DOSE, (OZEMPIC, 1 MG/DOSE,) 2 MG/1.5ML SOPN, Inject 1 mg into the skin once a week., Disp: 1.5 mL, Rfl: 2   zinc gluconate 50 MG tablet, Take by mouth., Disp:  , Rfl:   Observations/Objective: Patient is well-developed, well-nourished in no acute distress.  Resting comfortably  at home.  Head is normocephalic, atraumatic.  No labored breathing.  Speech is clear and coherent with logical content.  Patient is alert and oriented at baseline.  Patient sounds congested  Assessment and Plan: 1. Acute bacterial sinusitis  Prescribed Augmentin.  Discussed the risk of overuse of nasal decongestant sprays.  Prescribed Flonase to replace the nasal decongestant spray.  Recommended nasal saline spray.  Follow Up Instructions: I discussed the assessment and treatment plan with the patient. The patient was provided an opportunity to ask questions and all were answered. The patient agreed with the plan and demonstrated an understanding of the instructions.  A copy of instructions were sent to the patient via MyChart unless otherwise noted below.   The patient was advised to call back or seek an in-person evaluation if the symptoms worsen or if the condition fails to improve as anticipated.  Time:  I spent 8 minutes with the patient via telehealth technology discussing the above problems/concerns.    Carvel Getting, NP

## 2021-10-25 ENCOUNTER — Other Ambulatory Visit: Payer: Self-pay | Admitting: Endocrinology

## 2021-11-12 ENCOUNTER — Other Ambulatory Visit: Payer: Self-pay | Admitting: Endocrinology

## 2021-12-06 ENCOUNTER — Other Ambulatory Visit: Payer: Self-pay | Admitting: Endocrinology

## 2022-01-03 ENCOUNTER — Other Ambulatory Visit: Payer: Self-pay

## 2022-01-03 ENCOUNTER — Other Ambulatory Visit (INDEPENDENT_AMBULATORY_CARE_PROVIDER_SITE_OTHER): Payer: Medicare Other

## 2022-01-03 DIAGNOSIS — E119 Type 2 diabetes mellitus without complications: Secondary | ICD-10-CM | POA: Diagnosis not present

## 2022-01-03 LAB — MICROALBUMIN / CREATININE URINE RATIO
Creatinine,U: 131 mg/dL
Microalb Creat Ratio: 0.6 mg/g (ref 0.0–30.0)
Microalb, Ur: 0.8 mg/dL (ref 0.0–1.9)

## 2022-01-03 LAB — BASIC METABOLIC PANEL
BUN: 21 mg/dL (ref 6–23)
CO2: 28 mEq/L (ref 19–32)
Calcium: 10.3 mg/dL (ref 8.4–10.5)
Chloride: 106 mEq/L (ref 96–112)
Creatinine, Ser: 1.06 mg/dL (ref 0.40–1.50)
GFR: 71.9 mL/min (ref >60.00)
Glucose, Bld: 144 mg/dL — ABNORMAL HIGH (ref 70–99)
Potassium: 4.6 mEq/L (ref 3.5–5.1)
Sodium: 141 mEq/L (ref 135–145)

## 2022-01-03 LAB — HEMOGLOBIN A1C: Hgb A1c MFr Bld: 6.7 % — ABNORMAL HIGH (ref 4.6–6.5)

## 2022-01-05 ENCOUNTER — Ambulatory Visit (INDEPENDENT_AMBULATORY_CARE_PROVIDER_SITE_OTHER): Payer: Medicare Other | Admitting: Endocrinology

## 2022-01-05 ENCOUNTER — Encounter: Payer: Self-pay | Admitting: Endocrinology

## 2022-01-05 ENCOUNTER — Other Ambulatory Visit: Payer: Self-pay

## 2022-01-05 VITALS — BP 112/70 | HR 87 | Ht 69.0 in | Wt 167.6 lb

## 2022-01-05 DIAGNOSIS — E119 Type 2 diabetes mellitus without complications: Secondary | ICD-10-CM | POA: Diagnosis not present

## 2022-01-05 DIAGNOSIS — E785 Hyperlipidemia, unspecified: Secondary | ICD-10-CM

## 2022-01-05 MED ORDER — PRAVASTATIN SODIUM 20 MG PO TABS: 20.0000 mg | ORAL_TABLET | Freq: Every day | ORAL | 1 refills | Status: DC

## 2022-01-05 NOTE — Progress Notes (Signed)
Patient ID: Joseph Sharp, male   DOB: [birth year], 71 y.o.   MRN: [id number]   Reason for Appointment: follow-up for diabetes and blood pressure   History of Present Illness    Diagnosis: Type 2 DIABETES MELITUS, diagnosed in 1977     PAST history: He has had long-standing diabetes managed with multiple drugs and usually well controlled His blood sugar control is somewhat dependent on his compliance with diet and exercise regimen Amaryl was stopped in 2014 because of tendency to hypoglycemia and Prandin was started  In 11 /14 his blood sugars were higher overall and A1c was 7% which was higher than usual Because of this he was started on Trulicity injections, 2.99 mg He did benefit overall from starting 3.71 mg Trulicity  RECENT history:  His A1c is about the same at 6.7, previously was higher at 7.1 Has been as low as 6.4   Non-insulin hypoglycemic drugs: Amaryl 4 mg at night, metformin 2g, pioglitazone 30 mg, Ozempic 0.5 mg weekly  Current management, blood sugar patterns and problems identified: He has been checking his blood sugars as usual only a few days before his visit  He does not think his test trips are out of date  He does tend to have mildly increased fasting readings at times although usually checks at home soon after waking up  Despite reminders he has not done readings after meals but mostly before  He says he is generally very active with remodeling work at home  Weight is down 3 pounds  No side effects from [place] which he takes regularly He is taking a dose of 0.5 mg Ozempic, dialing halfway on the 1 mg pen out of a whole month   Side effects from medications: None  Monitors blood glucose:  1+ times a day     Glucometer:  Freestyle        Blood Glucose readings by download of meter:   PRE-MEAL Fasting Midday Dinner Bedtime Overall  Glucose range: 107-134 107-154 82-123    Mean/median: 123 129 117  16   POST-MEAL PC Breakfast PC Lunch  PC Dinner  Glucose range:   ?  Mean/median:       Previous range 86-138  nonfasting 175 after breakfast, 107 after dinner   Dinner usually at 7 pm  EXERCISE: With walking 3-4/7  days    Wt Readings from Last 3 Encounters:  01/05/22 167 lb 9.6 oz (76 kg)  08/30/21 170 lb 3.2 oz (77.2 kg)  05/30/21 167 lb 3.2 oz (75.8 kg)   Lab Results  Component Value Date   HGBA1C 6.7 (H) 01/03/2022   HGBA1C 6.6 (H) 08/25/2021   HGBA1C 7.1 (H) 05/26/2021   Lab Results  Component Value Date   MICROALBUR 0.8 01/03/2022   LDLCALC 86 08/25/2021   CREATININE 1.06 01/03/2022     Allergies as of 01/05/2022   No Known Allergies      Medication List        Accurate as of January 05, 2022  8:53 AM. If Joseph have any questions, ask your nurse or doctor.          amoxicillin-clavulanate 875-125 MG tablet Commonly known as: AUGMENTIN Take 1 tablet by mouth 2 (two) times daily.   B-12 PO Take by mouth.   fluticasone 50 MCG/ACT nasal spray Commonly known as: FLONASE Place 2 sprays into both nostrils daily.   glimepiride 4 MG tablet Commonly known as: AMARYL TAKE 1 TABLET BY MOUTH AT  BEDTIME.   glucose blood test strip 1 each by Other route as needed for other. Use as instructed to check blood sugar once daily.   metFORMIN 500 MG 24 hr tablet Commonly known as: GLUCOPHAGE-XR TAKE 4 TABLETS BY MOUTH EVERY DAY   Ozempic (1 MG/DOSE) 4 MG/3ML Sopn Generic drug: Semaglutide (1 MG/DOSE) INJECT 1MG  INTO THE SKIN ONCE A WEEK   pioglitazone 30 MG tablet Commonly known as: ACTOS TAKE 1 TABLET BY MOUTH EVERY DAY   zinc gluconate 50 MG tablet Take by mouth.        Allergies: No Known Allergies  Past Medical History:  Diagnosis Date   Allergy    Diabetes mellitus without complication [facility])     Past Surgical History:  Procedure Laterality Date   EYE SURGERY  01/2013   Eyelid surgery    Family History  Problem Relation Age of Onset   Diabetes Mother    Arthritis  Mother    Diabetes Brother    Cancer Neg Hx    Heart disease Neg Hx     Social History:  reports that he has never smoked. He has never used smokeless tobacco. He reports that he does not drink alcohol and does not use drugs.   Review of Systems    Blood pressure is consistently in the normal range without pharmacological therapy   BP Readings from Last 3 Encounters:  01/05/22 112/70  08/30/21 (!) 142/82  05/30/21 122/68   CREATININE level stable  Lab Results  Component Value Date   CREATININE 1.06 01/03/2022   CREATININE 1.01 08/25/2021   CREATININE 0.98 05/26/2021     HYPERLIPIDEMIA: The lipid abnormality consists of elevated LDL and triglycerides and low HDL LDL previously well controlled with lovastatin Has low HDL from metabolic syndrome and history of high triglycerides  He had stopped lovastatin in 2020  In June 2020 his triglycerides had gone up to 203 and LDL was 95 Although he tried taking the lovastatin again for a month he says it made his legs hurt and he stopped taking this again  His triglycerides have been consistently normal  LDL consistently 100   Lab Results  Component Value Date   CHOL 144 08/25/2021   CHOL 139 02/24/2021   CHOL 143 03/29/2020   Lab Results  Component Value Date   HDL 36.40 (L) 08/25/2021   HDL 36.50 (L) 02/24/2021   HDL 30.80 (L) 03/29/2020   Lab Results  Component Value Date   LDLCALC 86 08/25/2021   LDLCALC 79 02/24/2021   LDLCALC 85 03/29/2020   Lab Results  Component Value Date   TRIG 107.0 08/25/2021   TRIG 114.0 02/24/2021   TRIG 134.0 03/29/2020   Lab Results  Component Value Date   CHOLHDL 4 08/25/2021   CHOLHDL 4 02/24/2021   CHOLHDL 5 03/29/2020   Lab Results  Component Value Date   LDLDIRECT 95.0 04/18/2019     He takes vitamin D 1000 units OTC as recommended for supplementation with normal levels  Lab Results  Component Value Date   VD25OH 66.01 05/26/2021   VD25OH 54.76 03/29/2020           Examination:   BP 112/70    Pulse 87    Ht 5\' 9"  (1.753 m)    Wt 167 lb 9.6 oz (76 kg)    SpO2 97%    BMI 24.75 kg/m   Body mass index is 24.75 kg/m.       ASSESSMENT/ PLAN:  DIABETES, non-insulin-dependent:   See history of present illness for detailed discussion of current diabetes management, blood sugar patterns and problems identified  His A1c is consistently well controlled at 6.7  He is on multiple drugs including metformin, Ozempic, pioglitazone and glimepiride Blood sugars are excellent at home but needs to check readings after meals Encouraged him to stay active with some form of exercise regimen   Cardiovascular risk reduction: He has a low HDL Discussed the importance of cardiovascular risk reduction because of his history of diabetes and he agrees to go on a low-dose pravastatin 20 mg daily.  Previously had not tolerated lovastatin If he has any leg pains he can try to take this every other day  Blood pressure is back to normal and will continue to follow Urine microalbumin is normal  Again reminded him that he needs to make an appointment with his PCP for general physical  There are no Patient Instructions on file for this visit.   Joseph Sharp 01/05/2022, 8:53 AM   Note: This office note was prepared with Dragon voice recognition system technology. Any transcriptional errors that result from this process are unintentional.

## 2022-01-05 NOTE — Patient Instructions (Addendum)
Check blood sugars on waking up 3 days a week  Also check blood sugars about 2 hours after meals and do this after different meals by rotation  Recommended blood sugar levels on waking up are 90-130 and about 2 hours after meal is 130-160  Please bring your blood sugar monitor to each visit, thank you   

## 2022-01-23 ENCOUNTER — Other Ambulatory Visit: Payer: Self-pay

## 2022-01-23 ENCOUNTER — Ambulatory Visit (INDEPENDENT_AMBULATORY_CARE_PROVIDER_SITE_OTHER)
Admission: RE | Admit: 2022-01-23 | Discharge: 2022-01-23 | Disposition: A | Discharge status: Home / Self Care | Payer: Medicare Other | Source: Ambulatory Visit | Attending: Family Medicine | Admitting: Family Medicine

## 2022-01-23 ENCOUNTER — Encounter: Payer: Self-pay | Admitting: Family Medicine

## 2022-01-23 ENCOUNTER — Ambulatory Visit (INDEPENDENT_AMBULATORY_CARE_PROVIDER_SITE_OTHER): Payer: Medicare Other | Admitting: Family Medicine

## 2022-01-23 VITALS — BP 122/73 | HR 82 | Temp 97.7°F | Resp 18 | Ht 69.0 in | Wt 168.6 lb

## 2022-01-23 DIAGNOSIS — M7581 Other shoulder lesions, right shoulder: Secondary | ICD-10-CM

## 2022-01-23 DIAGNOSIS — M25511 Pain in right shoulder: Secondary | ICD-10-CM

## 2022-01-23 DIAGNOSIS — G8929 Other chronic pain: Secondary | ICD-10-CM

## 2022-01-23 MED ORDER — MELOXICAM 7.5 MG PO TABS
7.5000 mg | ORAL_TABLET | Freq: Every day | ORAL | 0 refills | Status: DC
Start: 2022-01-23 — End: 2022-02-13

## 2022-01-23 NOTE — Progress Notes (Signed)
Subjective:  Patient ID: Joseph Sharp, male    DOB: 1951-05-20  Age: 71 y.o. MRN: [id number]  CC:  Chief Complaint  Patient presents with   Arm Pain    Patient states he has been having some right arm pain for about 8 month that's not getting better. Patient states has took some tylenol fpr pain '    HPI Joseph Sharp presents for   R arm pain: 15 years ago felt pull/possible torn muscle with lifting chair up. Pain improved in 6 months.  Current pain started in July last year. No known injury, noticed pain with lifting R arm.  Top of arm, outside of shoulder. No limitations. Sore to elevate arm and twist open jars.  R hand dominant.  No neck pain, no rash.  No lower arm radiation.  Tx: occasional ibuprofen at night if needed. Some relief.   No hx pud/ckd.   History Patient Active Problem List   Diagnosis Date Noted   ED (erectile dysfunction) of organic origin 09/04/2014   Allergic rhinitis 08/10/2014   Diabetes mellitus, stable ([place]) 06/17/2013   Pure hypercholesterolemia 06/17/2013   Past Medical History:  Diagnosis Date   Allergy    Diabetes mellitus without complication [facility])    Past Surgical History:  Procedure Laterality Date   EYE SURGERY  01/2013   Eyelid surgery   No Known Allergies Prior to Admission medications   Medication Sig Start Date End Date Taking? Authorizing Provider  Cyanocobalamin (B-12 PO) Take by mouth.   Yes [provider]  fluticasone (FLONASE) 50 MCG/ACT nasal spray Place 2 sprays into both nostrils daily. 10/13/21  Yes Carvel Getting, NP  glimepiride (AMARYL) 4 MG tablet TAKE 1 TABLET BY MOUTH AT BEDTIME. 09/27/21  Yes Elayne Snare, MD  glucose blood test strip 1 each by Other route as needed for other. Use as instructed to check blood sugar once daily. 12/19/18  Yes Elayne Snare, MD  metFORMIN (GLUCOPHAGE-XR) 500 MG 24 hr tablet TAKE 4 TABLETS BY MOUTH EVERY DAY 07/26/21  Yes Elayne Snare, MD  OZEMPIC, 1 MG/DOSE, 4  MG/3ML SOPN INJECT '1MG'$  INTO THE SKIN ONCE A WEEK 12/06/21  Yes Elayne Snare, MD  pioglitazone (ACTOS) 30 MG tablet TAKE 1 TABLET BY MOUTH EVERY DAY 10/26/21  Yes Elayne Snare, MD  pravastatin (PRAVACHOL) 20 MG tablet Take 1 tablet (20 mg total) by mouth daily. 01/05/22  Yes Elayne Snare, MD  zinc gluconate 50 MG tablet Take by mouth.   Yes [provider]  amoxicillin-clavulanate (AUGMENTIN) 875-125 MG tablet Take 1 tablet by mouth 2 (two) times daily. Patient not taking: Reported on 01/05/2022 10/13/21   Carvel Getting, NP   Social History   Socioeconomic History   Marital status: Married    Spouse name: [name]   Number of children: 1   Years of education: Not on file   Highest education level: Not on file  Occupational History   Occupation: Engineering    Comment: [employer]  Tobacco Use   Smoking status: Never   Smokeless tobacco: Never  Substance and Sexual Activity   Alcohol use: No   Drug use: No   Sexual activity: Yes  Other Topics Concern   Not on file  Social History Narrative   Lives with his wife.  Their daughter lives in [place], [place].   Social Determinants of Health   Financial Resource Strain: Not on file  Food Insecurity: Not on file  Transportation Needs: Not on  file  Physical Activity: Not on file  Stress: Not on file  Social Connections: Not on file  Intimate Partner Violence: Not on file    Review of Systems Per hpi   Objective:   Vitals:   01/23/22 1342  BP: 122/73  Pulse: 82  Resp: 18  Temp: 97.7 F (36.5 C)  TempSrc: Temporal  SpO2: 100%  Weight: 168 lb 9.6 oz (76.5 kg)  Height: '5\' 9"'$  (1.753 m)     Physical Exam Constitutional:      General: He is not in acute distress.    Appearance: Normal appearance. He is well-developed.  HENT:     Head: Normocephalic and atraumatic.  Cardiovascular:     Rate and Rhythm: Normal rate.  Pulmonary:     Effort: Pulmonary effort is normal.  Musculoskeletal:     Comments: C-spine  no midline bony tenderness, pain-free range of motion of the cervical spine and does not reproduce shoulder/arm symptoms.  Right shoulder, no [place], AC or clavicle tenderness.  No focal bony tenderness or reproducible tenderness on palpation.  Bicipital groove nontender.  Skin intact. Active range of motion full, equal to left shoulder.  Discomfort with empty can testing without appreciable weakness.  Negative drop arm.  Other RTC strength intact.  Negative Neer, minimal discomfort with [place].  Negative crossover.  Neurovascular intact distally.  Neurological:     Mental Status: He is alert and oriented to person, place, and time.  Psychiatric:        Mood and Affect: Mood normal.       Assessment & Plan:  Joseph Sharp is a 71 y.o. male . Chronic right shoulder pain - Plan: DG Shoulder Right, meloxicam (MOBIC) 7.5 MG tablet  Right rotator cuff tendinitis - Plan: DG Shoulder Right, meloxicam (MOBIC) 7.5 MG tablet  Possible supraspinatus/RTC tendinitis.  Full range of motion, unlikely adhesive capsulitis.  Options discussed including Ortho eval, would like to check x-ray initially, trial of Mobic, recheck 3 weeks.  Consider Ortho eval or physical therapy at that time.  RTC precautions.  Potential side effects and risks of meds were discussed, short course of Mobic.  Meds ordered this encounter  Medications   meloxicam (MOBIC) 7.5 MG tablet    Sig: Take 1 tablet (7.5 mg total) by mouth daily.    Dispense:  30 tablet    Refill:  0   Patient Instructions  Try mobic once per day for next 2 weeks. Do not take ibuprofen.   Xray at [facility].  [place], [place] [id number]  Depending on your symptoms at follow up, we can decide about ortho or physical therapy.    Rotator Cuff Tendinitis Rotator cuff tendinitis is inflammation of the tendons in the rotator cuff. Tendons are tough, cord-like bands that connect muscle to bone. The rotator cuff includes all of the muscles  and tendons that connect the arm to the shoulder. The rotator cuff holds the head of the humerus, or the upper arm bone, in the cup of the shoulder blade (scapula). This condition can lead to a long-term or chronic tear. The tear may be partial or complete. What are the causes? This condition is usually caused by overusing the rotator cuff. What increases the risk? This condition is more likely to develop in athletes and workers who frequently use their shoulder or reach over their heads. This can include activities such as: Tennis. Baseball or softball. Swimming. Construction work. Painting. What are the signs or  symptoms? Symptoms of this condition include: Pain that spreads (radiates) from the shoulder to the upper arm. Swelling and tenderness in front of the shoulder. Pain when reaching, pulling, or lifting the arm above the head. Pain when lowering the arm from above the head. Minor pain in the shoulder when resting. Increased pain in the shoulder at night. Difficulty placing the arm behind the back. How is this diagnosed? This condition is diagnosed with a physical exam and medical history. Tests may also be done, including: X-rays. MRI. Ultrasound. CT with or without contrast. How is this treated? Treatment for this condition depends on the severity of the condition. In less severe cases, treatment may include: Rest. This may be done with a sling that holds the shoulder still (immobilization). Your health care provider may also recommend avoiding activities that involve lifting your arm over your head. Icing the shoulder. Anti-inflammatory medicines, such as aspirin or ibuprofen. In more severe cases, treatment may include: Physical therapy. Steroid injections. Surgery. Follow these instructions at home: If you have a sling: Wear the sling as told by your health care provider. Remove it only as told by your health care provider. Loosen it if your fingers tingle, become  numb, or turn cold and blue. Keep it clean. If the sling is not waterproof: Do not let it get wet. Cover it with a watertight covering when you take a bath or shower. Managing pain, stiffness, and swelling  If directed, put ice on the injured area. To do this: If you have a removable sling, remove it as told by your health care provider. Put ice in a plastic bag. Place a towel between your skin and the bag. Leave the ice on for 20 minutes, 2-3 times a day. Move your fingers often to reduce stiffness and swelling. Raise (elevate) the injured area above the level of your heart while you are lying down. Find a comfortable sleeping position, or sleep in a recliner, if available. Activity Rest your shoulder as told by your health care provider. Ask your health care provider when it is safe to drive if you have a sling on your arm. Return to your normal activities as told by your health care provider. Ask your health care provider what activities are safe for you. Do any exercises or stretches as told by your health care provider or physical therapist. If you do repetitive overhead tasks, take small breaks in between and include stretching exercises as told by your health care provider. General instructions Do not use any products that contain nicotine or tobacco, such as cigarettes, e-cigarettes, and chewing tobacco. These can delay healing. If you need help quitting, ask your health care provider. Take over-the-counter and prescription medicines only as told by your health care provider. Keep all follow-up visits as told by your health care provider. This is important. Contact a health care provider if: Your pain gets worse. You have new pain in your arm, hands, or fingers. Your pain is not relieved with medicine or does not get better after 6 weeks of treatment. You have crackling sensations when moving your shoulder in certain directions. You hear a snapping sound after using your  shoulder, followed by severe pain and weakness. Get help right away if: Your arm, hand, or fingers are numb or tingling. Your arm, hand, or fingers are swollen or painful or they turn white or blue. Summary Rotator cuff tendinitis is inflammation of the tendons in the rotator cuff. Tendons are tough, cord-like bands that connect  muscle to bone. This condition is usually caused by overusing the rotator cuff, which includes all of the muscles and tendons that connect the arm to the shoulder. This condition is more likely to develop in athletes and workers who frequently use their shoulder or reach over their heads. Treatment generally includes rest, anti-inflammatory medicines, and icing. In some cases, physical therapy and steroid injections may be needed. In severe cases, surgery may be needed. This information is not intended to replace advice given to you by your health care provider. Make sure you discuss any questions you have with your health care provider. Document Revised: 08/04/2019 Document Reviewed: 08/04/2019 Elsevier Patient Education  2022 [place],   Merri Ray, MD [place], [facility] 01/23/22 2:22 PM

## 2022-01-23 NOTE — Patient Instructions (Addendum)
Try mobic once per day for next 2 weeks. Do not take ibuprofen.  ? ?Xray at [facility] ?[facility].  ?[place], [place] [address] ? ?Depending on your symptoms at follow up, we can decide about ortho or physical therapy.  ? ? ?Rotator Cuff Tendinitis ?Rotator cuff tendinitis is inflammation of the tendons in the rotator cuff. Tendons are tough, cord-like bands that connect muscle to bone. The rotator cuff includes all of the muscles and tendons that connect the arm to the shoulder. The rotator cuff holds the head of the humerus, or the upper arm bone, in the cup of the shoulder blade (scapula). ?This condition can lead to a long-term or chronic tear. The tear may be partial or complete. ?What are the causes? ?This condition is usually caused by overusing the rotator cuff. ?What increases the risk? ?This condition is more likely to develop in athletes and workers who frequently use their shoulder or reach over their heads. This can include activities such as: ?Tennis. ?Baseball or softball. ?Swimming. ?Architect work. ?Painting. ?What are the signs or symptoms? ?Symptoms of this condition include: ?Pain that spreads (radiates) from the shoulder to the upper arm. ?Swelling and tenderness in front of the shoulder. ?Pain when reaching, pulling, or lifting the arm above the head. ?Pain when lowering the arm from above the head. ?Minor pain in the shoulder when resting. ?Increased pain in the shoulder at night. ?Difficulty placing the arm behind the back. ?How is this diagnosed? ?This condition is diagnosed with a physical exam and medical history. Tests may also be done, including: ?X-rays. ?MRI. ?Ultrasound. ?CT with or without contrast. ?How is this treated? ?Treatment for this condition depends on the severity of the condition. In less severe cases, treatment may include: ?Rest. This may be done with a sling that holds the shoulder still (immobilization). Your health care provider may also recommend avoiding  activities that involve lifting your arm over your head. ?Icing the shoulder. ?Anti-inflammatory medicines, such as aspirin or ibuprofen. ?In more severe cases, treatment may include: ?Physical therapy. ?Steroid injections. ?Surgery. ?Follow these instructions at home: ?If you have a sling: ?Wear the sling as told by your health care provider. Remove it only as told by your health care provider. ?Loosen it if your fingers tingle, become numb, or turn cold and blue. ?Keep it clean. ?If the sling is not waterproof: ?Do not let it get wet. ?Cover it with a watertight covering when you take a bath or shower. ?Managing pain, stiffness, and swelling ? ?If directed, put ice on the injured area. To do this: ?If you have a removable sling, remove it as told by your health care provider. ?Put ice in a plastic bag. ?Place a towel between your skin and the bag. ?Leave the ice on for 20 minutes, 2-3 times a day. ?Move your fingers often to reduce stiffness and swelling. ?Raise (elevate) the injured area above the level of your heart while you are lying down. ?Find a comfortable sleeping position, or sleep in a recliner, if available. ?Activity ?Rest your shoulder as told by your health care provider. ?Ask your health care provider when it is safe to drive if you have a sling on your arm. ?Return to your normal activities as told by your health care provider. Ask your health care provider what activities are safe for you. ?Do any exercises or stretches as told by your health care provider or physical therapist. ?If you do repetitive overhead tasks, take small breaks in  between and include stretching exercises as told by your health care provider. ?General instructions ?Do not use any products that contain nicotine or tobacco, such as cigarettes, e-cigarettes, and chewing tobacco. These can delay healing. If you need help quitting, ask your health care provider. ?Take over-the-counter and prescription medicines only as told by  your health care provider. ?Keep all follow-up visits as told by your health care provider. This is important. ?Contact a health care provider if: ?Your pain gets worse. ?You have new pain in your arm, hands, or fingers. ?Your pain is not relieved with medicine or does not get better after 6 weeks of treatment. ?You have crackling sensations when moving your shoulder in certain directions. ?You hear a snapping sound after using your shoulder, followed by severe pain and weakness. ?Get help right away if: ?Your arm, hand, or fingers are numb or tingling. ?Your arm, hand, or fingers are swollen or painful or they turn white or blue. ?Summary ?Rotator cuff tendinitis is inflammation of the tendons in the rotator cuff. Tendons are tough, cord-like bands that connect muscle to bone. ?This condition is usually caused by overusing the rotator cuff, which includes all of the muscles and tendons that connect the arm to the shoulder. ?This condition is more likely to develop in athletes and workers who frequently use their shoulder or reach over their heads. ?Treatment generally includes rest, anti-inflammatory medicines, and icing. In some cases, physical therapy and steroid injections may be needed. In severe cases, surgery may be needed. ?This information is not intended to replace advice given to you by your health care provider. Make sure you discuss any questions you have with your health care provider. ?Document Revised: 08/04/2019 Document Reviewed: 08/04/2019 ?Elsevier Patient Education ? [place]. ? ?

## 2022-02-13 ENCOUNTER — Ambulatory Visit (INDEPENDENT_AMBULATORY_CARE_PROVIDER_SITE_OTHER): Payer: Medicare Other | Admitting: Family Medicine

## 2022-02-13 VITALS — BP 122/66 | HR 71 | Temp 97.8°F | Resp 17 | Ht 69.0 in | Wt 172.6 lb

## 2022-02-13 DIAGNOSIS — M7581 Other shoulder lesions, right shoulder: Secondary | ICD-10-CM

## 2022-02-13 DIAGNOSIS — M25511 Pain in right shoulder: Secondary | ICD-10-CM

## 2022-02-13 DIAGNOSIS — R3915 Urgency of urination: Secondary | ICD-10-CM

## 2022-02-13 DIAGNOSIS — G8929 Other chronic pain: Secondary | ICD-10-CM

## 2022-02-13 DIAGNOSIS — N3943 Post-void dribbling: Secondary | ICD-10-CM | POA: Diagnosis not present

## 2022-02-13 LAB — POCT URINALYSIS DIP (MANUAL ENTRY)
Bilirubin, UA: NEGATIVE
Blood, UA: NEGATIVE
Glucose, UA: NEGATIVE mg/dL
Leukocytes, UA: NEGATIVE
Nitrite, UA: NEGATIVE
Protein Ur, POC: NEGATIVE mg/dL
Spec Grav, UA: 1.015 (ref 1.010–1.025)
Urobilinogen, UA: 0.2 E.U./dL
pH, UA: 5 (ref 5.0–8.0)

## 2022-02-13 LAB — URINALYSIS, MICROSCOPIC ONLY: RBC / HPF: NONE SEEN (ref 0–?)

## 2022-02-13 LAB — PSA: PSA: 0.34 ng/mL (ref 0.10–4.00)

## 2022-02-13 MED ORDER — MELOXICAM 7.5 MG PO TABS: 7.5000 mg | ORAL_TABLET | Freq: Every day | ORAL | 0 refills | Status: DC

## 2022-02-13 NOTE — Patient Instructions (Signed)
Mobic refilled, but try to lessen use as pain improves. I will refer you to physical therapy, recheck in 6 weeks.  ? ? ? ?Rotator Cuff Tendinitis ?Rotator cuff tendinitis is inflammation of the tendons in the rotator cuff. Tendons are tough, cord-like bands that connect muscle to bone. The rotator cuff includes all of the muscles and tendons that connect the arm to the shoulder. The rotator cuff holds the head of the humerus, or the upper arm bone, in the cup of the shoulder blade (scapula). ?This condition can lead to a long-term or chronic tear. The tear may be partial or complete. ?What are the causes? ?This condition is usually caused by overusing the rotator cuff. ?What increases the risk? ?This condition is more likely to develop in athletes and workers who frequently use their shoulder or reach over their heads. This can include activities such as: ?Tennis. ?Baseball or softball. ?Swimming. ?Architect work. ?Painting. ?What are the signs or symptoms? ?Symptoms of this condition include: ?Pain that spreads (radiates) from the shoulder to the upper arm. ?Swelling and tenderness in front of the shoulder. ?Pain when reaching, pulling, or lifting the arm above the head. ?Pain when lowering the arm from above the head. ?Minor pain in the shoulder when resting. ?Increased pain in the shoulder at night. ?Difficulty placing the arm behind the back. ?How is this diagnosed? ?This condition is diagnosed with a physical exam and medical history. Tests may also be done, including: ?X-rays. ?MRI. ?Ultrasound. ?CT with or without contrast. ?How is this treated? ?Treatment for this condition depends on the severity of the condition. In less severe cases, treatment may include: ?Rest. This may be done with a sling that holds the shoulder still (immobilization). Your health care provider may also recommend avoiding activities that involve lifting your arm over your head. ?Icing the shoulder. ?Anti-inflammatory medicines,  such as aspirin or ibuprofen. ?In more severe cases, treatment may include: ?Physical therapy. ?Steroid injections. ?Surgery. ?Follow these instructions at home: ?If you have a sling: ?Wear the sling as told by your health care provider. Remove it only as told by your health care provider. ?Loosen it if your fingers tingle, become numb, or turn cold and blue. ?Keep it clean. ?If the sling is not waterproof: ?Do not let it get wet. ?Cover it with a watertight covering when you take a bath or shower. ?Managing pain, stiffness, and swelling ? ?If directed, put ice on the injured area. To do this: ?If you have a removable sling, remove it as told by your health care provider. ?Put ice in a plastic bag. ?Place a towel between your skin and the bag. ?Leave the ice on for 20 minutes, 2-3 times a day. ?Move your fingers often to reduce stiffness and swelling. ?Raise (elevate) the injured area above the level of your heart while you are lying down. ?Find a comfortable sleeping position, or sleep in a recliner, if available. ?Activity ?Rest your shoulder as told by your health care provider. ?Ask your health care provider when it is safe to drive if you have a sling on your arm. ?Return to your normal activities as told by your health care provider. Ask your health care provider what activities are safe for you. ?Do any exercises or stretches as told by your health care provider or physical therapist. ?If you do repetitive overhead tasks, take small breaks in between and include stretching exercises as told by your health care provider. ?General instructions ?Do not use any products that contain  nicotine or tobacco, such as cigarettes, e-cigarettes, and chewing tobacco. These can delay healing. If you need help quitting, ask your health care provider. ?Take over-the-counter and prescription medicines only as told by your health care provider. ?Keep all follow-up visits as told by your health care provider. This is  important. ?Contact a health care provider if: ?Your pain gets worse. ?You have new pain in your arm, hands, or fingers. ?Your pain is not relieved with medicine or does not get better after 6 weeks of treatment. ?You have crackling sensations when moving your shoulder in certain directions. ?You hear a snapping sound after using your shoulder, followed by severe pain and weakness. ?Get help right away if: ?Your arm, hand, or fingers are numb or tingling. ?Your arm, hand, or fingers are swollen or painful or they turn white or blue. ?Summary ?Rotator cuff tendinitis is inflammation of the tendons in the rotator cuff. Tendons are tough, cord-like bands that connect muscle to bone. ?This condition is usually caused by overusing the rotator cuff, which includes all of the muscles and tendons that connect the arm to the shoulder. ?This condition is more likely to develop in athletes and workers who frequently use their shoulder or reach over their heads. ?Treatment generally includes rest, anti-inflammatory medicines, and icing. In some cases, physical therapy and steroid injections may be needed. In severe cases, surgery may be needed. ?This information is not intended to replace advice given to you by your health care provider. Make sure you discuss any questions you have with your health care provider. ?Document Revised: 08/04/2019 Document Reviewed: 08/04/2019 ?Elsevier Patient Education ? [place]. ? ?

## 2022-02-13 NOTE — Progress Notes (Signed)
? ?Subjective:  ?Patient ID: Joseph Sharp, male    DOB: 07-08-1951  Age: 71 y.o. MRN: [id number] ? ?CC:  ?Chief Complaint  ?Patient presents with  ? Shoulder Pain  ?  Pt reports Rt shoulder pain has improved over last several weeks,   ? ? ?HPI ?Forde Radon presents for  ?Right shoulder pain ?Discussed March 13.  Chronic.  Some flare since July of last year.  Noticed with abduction.  No limitations.  Rotator cuff tendinosis.  Full range of motion, less likely adhesive capsulitis versus early adhesive capsulitis.  Options were discussed at that time but initially tried Mobic 7.5 mg daily, imaging indicated mild acromioclavicular and glenohumeral osteoarthritis.  No evidence of fracture or dislocation. ? ?Shoulder pain has been improving, taking mobic QD. No other treatments.  ? ?Incomplete bladder emptying.  ?Noticed past year, no hematuria. No dysuria or acute changes. Some dribbling, no hesitancy. Small amounts when feels like needs to go more. No retention.  ?Nocturia once at times - not nightly.  ?Small amounts at times.  ?Lab Results  ?Component Value Date  ? PSA1 0.5 08/28/2018  ?The natural history of prostate cancer and ongoing controversy regarding screening and potential treatment outcomes of prostate cancer has been discussed with the patient. The meaning of a false positive PSA and a false negative PSA has been discussed. He indicates understanding of the limitations of this screening test and wishes to proceed with screening PSA testing. ?No FH prostate CA. ? ?Considering shingrix vaccine. Recommended at his pharmacy.  ?Immunization History  ?Administered Date(s) Administered  ? Influenza, High Dose Seasonal PF 08/28/2018, 08/26/2019, 08/21/2021  ? Influenza,inj,Quad PF,6+ Mos 09/04/2014, 08/18/2015, 08/08/2016, 10/12/2017  ? PFIZER(Purple Top)SARS-COV-2 Vaccination 12/03/2019, 12/24/2019  ? Pension scheme manager 50yr & up 08/21/2021  ? Pneumococcal Conjugate-13  04/26/2016  ? Pneumococcal Polysaccharide-23 08/28/2018  ? Zoster, Live 09/04/2014  ? ? ? ? ? ?History ?Patient Active Problem List  ? Diagnosis Date Noted  ? ED (erectile dysfunction) of organic origin 09/04/2014  ? Allergic rhinitis 08/10/2014  ? Diabetes mellitus, stable ([facility] 06/17/2013  ? Pure hypercholesterolemia 06/17/2013  ? ?Past Medical History:  ?Diagnosis Date  ? Allergy   ? Diabetes mellitus without complication ([facility]   ? ?Past Surgical History:  ?Procedure Laterality Date  ? EYE SURGERY  01/2013  ? Eyelid surgery  ? ?No Known Allergies ?Prior to Admission medications   ?Medication Sig Start Date End Date Taking? Authorizing Provider  ?Cyanocobalamin (B-12 PO) Take by mouth.   Yes [provider]  ?fluticasone (FLONASE) 50 MCG/ACT nasal spray Place 2 sprays into both nostrils daily. 10/13/21  Yes KCarvel Getting NP  ?glimepiride (AMARYL) 4 MG tablet TAKE 1 TABLET BY MOUTH AT BEDTIME. 09/27/21  Yes KElayne Snare MD  ?glucose blood test strip 1 each by Other route as needed for other. Use as instructed to check blood sugar once daily. 12/19/18  Yes KElayne Snare MD  ?meloxicam (MOBIC) 7.5 MG tablet Take 1 tablet (7.5 mg total) by mouth daily. 01/23/22  Yes GWendie Agreste MD  ?metFORMIN (GLUCOPHAGE-XR) 500 MG 24 hr tablet TAKE 4 TABLETS BY MOUTH EVERY DAY 07/26/21  Yes KElayne Snare MD  ?OZEMPIC, 1 MG/DOSE, 4 MG/3ML SOPN INJECT '1MG'$  INTO THE SKIN ONCE A WEEK 12/06/21  Yes KElayne Snare MD  ?pioglitazone (ACTOS) 30 MG tablet TAKE 1 TABLET BY MOUTH EVERY DAY 10/26/21  Yes KElayne Snare MD  ?zinc gluconate 50 MG tablet Take by mouth.  Yes [provider]  ?pravastatin (PRAVACHOL) 20 MG tablet Take 1 tablet (20 mg total) by mouth daily. ?Patient not taking: Reported on 02/13/2022 01/05/22   Elayne Snare, MD  ? ?Social History  ? ?Socioeconomic History  ? Marital status: Married  ?  Spouse name: [name]  ? Number of children: 1  ? Years of education: Not on file  ? Highest education level: Not on file   ?Occupational History  ? Occupation: Engineer, production  ?  Comment: [employer]  ?Tobacco Use  ? Smoking status: Never  ? Smokeless tobacco: Never  ?Substance and Sexual Activity  ? Alcohol use: No  ? Drug use: No  ? Sexual activity: Yes  ?Other Topics Concern  ? Not on file  ?Social History Narrative  ? Lives with his wife.  Their daughter lives in [place], [place].  ? ?Social Determinants of Health  ? ?Financial Resource Strain: Not on file  ?Food Insecurity: Not on file  ?Transportation Needs: Not on file  ?Physical Activity: Not on file  ?Stress: Not on file  ?Social Connections: Not on file  ?Intimate Partner Violence: Not on file  ? ? ?Review of Systems ?Per HPI.  ? ?Objective:  ? ?Vitals:  ? 02/13/22 0950  ?BP: 122/66  ?Pulse: 71  ?Resp: 17  ?Temp: 97.8 ?F (36.6 ?C)  ?TempSrc: Temporal  ?SpO2: 96%  ?Weight: 172 lb 9.6 oz (78.3 kg)  ?Height: '5\' 9"'$  (1.753 m)  ? ? ? ?Physical Exam ?Vitals reviewed.  ?Constitutional:   ?   General: He is not in acute distress. ?   Appearance: Normal appearance. He is well-developed.  ?HENT:  ?   Head: Normocephalic and atraumatic.  ?Cardiovascular:  ?   Rate and Rhythm: Normal rate.  ?Pulmonary:  ?   Effort: Pulmonary effort is normal.  ?Genitourinary: ?   Prostate: Not enlarged (no apparent nodule or appreciable enlargement.), not tender and no nodules present.  ?Musculoskeletal:  ?   Comments: C-spine pain-free range of motion, does not reproduce shoulder symptoms ?Right shoulder, no focal bony tenderness.  Internal rotation approximately 2-3 vertebrae lower than left side.  Otherwise overall equal range of motion.  Pain with empty can, no weakness.  Negative drop arm.  Negative Neer, Hawkins, crossover.  Neurovascular intact distally.  ?Neurological:  ?   Mental Status: He is alert and oriented to person, place, and time.  ?Psychiatric:     ?   Mood and Affect: Mood normal.  ? ? ? ? ? ?Assessment & Plan:  ?Joseph Sharp is a 71 y.o. male . ?Right rotator cuff  tendinitis - Plan: meloxicam (MOBIC) 7.5 MG tablet, Ambulatory referral to Physical Therapy ?Chronic right shoulder pain - Plan: meloxicam (MOBIC) 7.5 MG tablet, Ambulatory referral to Physical Therapy ? -Suspect some component of chronic right shoulder pain, rotator cuff tendinosis likely based on current exam, supraspinatus.  Some underlying degenerative changes but less likely cause of current pain overall improved with meloxicam but discussed potential risks of long-term use of anti-inflammatories.  Referred to physical therapy, recheck 6 weeks ? ?Urinary dribbling - Plan: POCT urinalysis dipstick, Urine Microscopic, PSA ?Urgency of urination - Plan: POCT urinalysis dipstick, Urine Microscopic, PSA ? -New concern as above.  No appreciable nodules as on exam or apparent significant prostatomegaly.  Check urinalysis, PSA.  Minimal symptoms at this time, will hold on medications for now. consider low-dose alpha blocker.  Recheck 6 weeks with other labs.  ? ?In office urinalysis with trace ketones but  negative nitrite/LE. ? ?Meds ordered this encounter  ?Medications  ? meloxicam (MOBIC) 7.5 MG tablet  ?  Sig: Take 1 tablet (7.5 mg total) by mouth daily.  ?  Dispense:  30 tablet  ?  Refill:  0  ? ?Patient Instructions  ?Mobic refilled, but try to lessen use as pain improves. I will refer you to physical therapy, recheck in 6 weeks.  ? ? ? ?Rotator Cuff Tendinitis ?Rotator cuff tendinitis is inflammation of the tendons in the rotator cuff. Tendons are tough, cord-like bands that connect muscle to bone. The rotator cuff includes all of the muscles and tendons that connect the arm to the shoulder. The rotator cuff holds the head of the humerus, or the upper arm bone, in the cup of the shoulder blade (scapula). ?This condition can lead to a long-term or chronic tear. The tear may be partial or complete. ?What are the causes? ?This condition is usually caused by overusing the rotator cuff. ?What increases the risk? ?This  condition is more likely to develop in athletes and workers who frequently use their shoulder or reach over their heads. This can include activities such as: ?Tennis. ?Baseball or softball. ?Swimming. ?Architect work

## 2022-02-14 ENCOUNTER — Encounter: Payer: Self-pay | Admitting: Family Medicine

## 2022-02-15 ENCOUNTER — Ambulatory Visit (INDEPENDENT_AMBULATORY_CARE_PROVIDER_SITE_OTHER): Payer: Medicare Other | Admitting: Rehabilitative and Restorative Service Providers"

## 2022-02-15 ENCOUNTER — Encounter: Payer: Self-pay | Admitting: Rehabilitative and Restorative Service Providers"

## 2022-02-15 DIAGNOSIS — M25511 Pain in right shoulder: Secondary | ICD-10-CM | POA: Diagnosis not present

## 2022-02-15 DIAGNOSIS — M6281 Muscle weakness (generalized): Secondary | ICD-10-CM

## 2022-02-15 DIAGNOSIS — M25611 Stiffness of right shoulder, not elsewhere classified: Secondary | ICD-10-CM

## 2022-02-15 NOTE — Therapy (Signed)
?OUTPATIENT PHYSICAL THERAPY SHOULDER EVALUATION ? ? ?Patient Name: Joseph Sharp ?MRN: [id number] ?DOB:Jul 23, 1951, 71 y.o., male ?Today's Date: 02/15/2022 ? ? PT End of Session - 02/15/22 1601   ? ? Visit Number 1   ? Number of Visits 10   ? Date for PT Re-Evaluation 03/29/22   ? PT Start Time 0930   ? PT Stop Time 1015   ? PT Time Calculation (min) 45 min   ? Activity Tolerance Patient tolerated treatment well;No increased pain   ? Behavior During Therapy [facility] for tasks assessed/performed   ? ?  ?  ? ?  ? ? ?Past Medical History:  ?Diagnosis Date  ? Allergy   ? Diabetes mellitus without complication ([place])   ? ?Past Surgical History:  ?Procedure Laterality Date  ? EYE SURGERY  01/2013  ? Eyelid surgery  ? ?Patient Active Problem List  ? Diagnosis Date Noted  ? ED (erectile dysfunction) of organic origin 09/04/2014  ? Allergic rhinitis 08/10/2014  ? Diabetes mellitus, stable ([place]) 06/17/2013  ? Pure hypercholesterolemia 06/17/2013  ? ? ?PCP: [name], MD ? ?REFERRING PROVIDER: [name], MD ? ?REFERRING DIAG:  ?M25.511,G89.29 (ICD-10-CM) - Chronic right shoulder pain  ?M75.81 (ICD-10-CM) - Right rotator cuff tendinitis  ? ? ?THERAPY DIAG:  ?Stiffness of right shoulder, not elsewhere classified - Plan: PT plan of care cert/re-cert ? ?Right shoulder pain, unspecified chronicity - Plan: PT plan of care cert/re-cert ? ?Muscle weakness (generalized) - Plan: PT plan of care cert/re-cert ? ? ?ONSET DATE: 8 months ago.  Had a previous R shoulder episode "years ago." ? ?SUBJECTIVE:                                                                                                                                                                                     ? ?SUBJECTIVE STATEMENT: ?Joseph Sharp started having R shoulder pain about 8 months ago.  He has had previous R shoulder pain.  He is having some R shoulder pain at night.  He wanted to get it addressed before it got any worse. ? ?PERTINENT  HISTORY: ?Diabetes, previous R shoulder pain ? ?PAIN:  ?Are you having pain? Yes: NPRS scale: 0-2/10 ?Pain location: R shoulder  ?Pain description: Can be sharp ?Aggravating factors: Opening a jar, sleeping, reaching and overhead function ?Relieving factors: Medication ? ?PRECAUTIONS: Shoulder ? ?WEIGHT BEARING RESTRICTIONS Yes R shoulder ? ?FALLS:  ?Has patient fallen in last 6 months? No ? ?LIVING ENVIRONMENT: ?Lives with: lives with their spouse ?Lives in: House/apartment ?Stairs: No problems ?Has following equipment at home: None ? 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 ? ?PLOF: Independent ? ?PATIENT GOALS  Ger back to pain-free R shoulder function. ? ?OBJECTIVE:  ? ?DIAGNOSTIC FINDINGS:  ?X-ray shows mild acromioclavicular and glenohumeral osteoarthritis. ? ?PATIENT SURVEYS:  ?FOTO 50 (Goal 69 in 11 visits) ? ?COGNITION: ? Overall cognitive status: Within functional limits for tasks assessed ?    ?SENSATION: ?Not tested ? ?UPPER EXTREMITY ROM:  ? ?Active ROM Right ?02/15/2022 Left ?02/15/2022  ?Shoulder flexion 135 150  ?Shoulder extension    ?Shoulder abduction    ?Shoulder horizontal adduction 20 40  ?Shoulder internal rotation 35 60  ?Shoulder external rotation 90 80  ?Elbow flexion    ?Elbow extension    ?Wrist flexion    ?Wrist extension    ?Wrist ulnar deviation    ?Wrist radial deviation    ?Wrist pronation    ?Wrist supination    ?(Blank rows = not tested) ? ?UPPER EXTREMITY MMT: ? ?Strength with hand-held dynamometer in pounds Right ?02/15/2022 Left ?02/15/2022  ?Shoulder flexion    ?Shoulder extension    ?Shoulder abduction    ?Shoulder adduction    ?Shoulder internal rotation 18.1 30.1  ?Shoulder external rotation 20.3 21.4  ?Middle trapezius    ?Lower trapezius    ?Elbow flexion    ?Elbow extension    ?Wrist flexion    ?Wrist extension    ?Wrist ulnar deviation    ?Wrist radial deviation    ?Wrist pronation    ?Wrist supination    ?Grip strength (lbs)    ?(Blank rows = not tested) ? ?  ?TODAY'S  TREATMENT:  ?02/15/2022 ?Supine arm raises 20X 3 seconds (serratus anterior) ?Supine IR AROM stretch 10X 10 seconds ?Shoulder blade pinches 10X 5 seconds ?Thumb up the back stretch 10X 10 seconds ?Theraband IR Green 10X 3 seconds slow eccentrics ? ?Functional Activities: Education as outlined below. ? ?PATIENT EDUCATION: ?Education details: Reviewed exam findings, shoulder anatomy and imaging.  Discussed mechanism of impingement with probable bursitis/tendonitis, possible partial RTC tear.  Started day 1 HEP. ?Person educated: Patient ?Education method: Explanation, Demonstration, Tactile cues, Verbal cues, and Handouts ?Education comprehension: verbalized understanding, returned demonstration, verbal cues required, tactile cues required, and needs further education ? ? ?HOME EXERCISE PROGRAM: ?Access Code: [email or web address] ?URL: [email or web address] ?Date: 02/15/2022 ?Prepared by: Vista Mink ? ?Exercises ?- Supine Shoulder Internal Rotation Stretch  - 2 x daily - 7 x weekly - 1 sets - 10-20 reps - 10 seconds hold ?- Standing Shoulder Internal Rotation Stretch with Hands Behind Back  - 2 x daily - 7 x weekly - 1 sets - 10 reps - 10 seconds hold ?- Supine Scapular Protraction in Flexion with Dumbbells  - 2 x daily - 7 x weekly - 1 sets - 20 reps - 3 seconds hold ?- Standing Scapular Retraction  - 5 x daily - 7 x weekly - 1 sets - 5 reps - 5 second hold ?- Shoulder Internal Rotation with Resistance  - 1 x daily - 7 x weekly - 2 sets - 10 reps - 3 seconds hold ? ?ASSESSMENT: ? ?CLINICAL IMPRESSION: ?Patient is a 71 y.o. male who was seen today for physical therapy evaluation and treatment for R shoulder pain.  Signs and symptoms are consistent with bursitis/tenoditis/possible partial RTC tear and impingement.  Focus of treatment will be to restore normal capsular flexibility, shoulder AROM and improve scapular and RTC strength to meet the below listed goals.  Prognosis is good with the recommended  POC. ? ? ?OBJECTIVE IMPAIRMENTS decreased endurance, decreased knowledge of condition, decreased ROM, decreased strength, decreased safety  awareness, impaired perceived functional ability, impaired flexibility, impaired UE functional use, and pain.  ? ?ACTIVITY LIMITATIONS community activity and yard work.  ? ?PERSONAL FACTORS Diabetes may also affect this patient's functional outcome.  ? ? ?REHAB POTENTIAL: Good ? ?CLINICAL DECISION MAKING: Stable/uncomplicated ? ?EVALUATION COMPLEXITY: Low ? ? ?GOALS: ?Goals reviewed with patient? Yes ? ?SHORT TERM GOALS: Target date: 03/01/2022 ? ?Improve R shoulder AROM for flexion to 150 degrees, IR to 50 degrees and horizontal adduction to 30 degrees. ?Baseline: See objective ?Goal status: INITIAL ? ? ?LONG TERM GOALS: Target date: 03/29/2022 ? ?Improve FOTO to 69 in 11 visits. ?Baseline: 50 ?Goal status: INITIAL ? ?2.  Improve R shoulder pain to consistently 0-1/10 on the VAS. ?Baseline: 0-2/10 ?Goal status: INITIAL ? ?3.  Improve R shoulder AROM for flexion to 170 degrees; IR to 60 degrees and horizontal adduction to 40 degrees. ?Baseline: See objective ?Goal status: INITIAL ? ?4.  Improve R shoulder strength to 90% or better of the uninvolved L. ?Baseline: 74% ?Goal status: INITIAL ? ?5.  Joseph Sharp will be independent with his long-term HEP at [place]. ?Baseline: Started 02/15/2022 ?Goal status: INITIAL ? ?PLAN: ?PT FREQUENCY: 1-2x/week ? ?PT DURATION: 6 weeks ? ?PLANNED INTERVENTIONS: Therapeutic exercises, Therapeutic activity, Neuromuscular re-education, Patient/Family education, Joint mobilization, Cryotherapy, and Manual therapy ? ?PLAN FOR NEXT SESSION: Review HEP.  Address posterior capsule tightness, scapular and RTC weakness. ? ? ?Farley Ly, PT, MPT ?02/15/2022, 4:06 PM  ?

## 2022-02-24 ENCOUNTER — Encounter: Payer: Self-pay | Admitting: Physical Therapy

## 2022-02-24 ENCOUNTER — Ambulatory Visit (INDEPENDENT_AMBULATORY_CARE_PROVIDER_SITE_OTHER): Payer: Medicare Other | Admitting: Physical Therapy

## 2022-02-24 DIAGNOSIS — M25611 Stiffness of right shoulder, not elsewhere classified: Secondary | ICD-10-CM | POA: Diagnosis not present

## 2022-02-24 DIAGNOSIS — M25511 Pain in right shoulder: Secondary | ICD-10-CM

## 2022-02-24 DIAGNOSIS — M6281 Muscle weakness (generalized): Secondary | ICD-10-CM

## 2022-02-24 NOTE — Therapy (Signed)
?OUTPATIENT PHYSICAL THERAPY TREATMENT NOTE ? ? ?Patient Name: Joseph Sharp ?MRN: [id number] ?DOB:24-Oct-1951, 71 y.o., male ?Today's Date: 02/24/2022 ? ?PCP: [name], MD ?REFERRING PROVIDER: Rhetta Mura, MD ? ?END OF SESSION:  ? PT End of Session - 02/24/22 0805   ? ? Visit Number 2   ? Number of Visits 10   ? Date for PT Re-Evaluation 03/29/22   ? PT Start Time 0800   ? PT Stop Time 0845   ? PT Time Calculation (min) 45 min   ? Activity Tolerance Patient tolerated treatment well;No increased pain   ? Behavior During Therapy [facility] for tasks assessed/performed   ? ?  ?  ? ?  ? ? ?Past Medical History:  ?Diagnosis Date  ? Allergy   ? Diabetes mellitus without complication ([place])   ? ?Past Surgical History:  ?Procedure Laterality Date  ? EYE SURGERY  01/2013  ? Eyelid surgery  ? ?Patient Active Problem List  ? Diagnosis Date Noted  ? ED (erectile dysfunction) of organic origin 09/04/2014  ? Allergic rhinitis 08/10/2014  ? Diabetes mellitus, stable ([place]) 06/17/2013  ? Pure hypercholesterolemia 06/17/2013  ? ? ?REFERRING DIAG:  ?M25.511,G89.29 (ICD-10-CM) - Chronic right shoulder pain  ?M75.81 (ICD-10-CM) - Right rotator cuff tendinitis  ? ? ?THERAPY DIAG:  ?Stiffness of right shoulder, not elsewhere classified ? ?Right shoulder pain, unspecified chronicity ? ?Muscle weakness (generalized) ? ?PERTINENT HISTORY: Diabetes, previous R shoulder pain ? ?PRECAUTIONS: None ? ?SUBJECTIVE: relays shoulder feels about the same ? ?PAIN:  ?Are you having pain? No pain at rest upon arrival ? ? ? ? ? ?OBJECTIVE:  ? ?  ?PATIENT SURVEYS:  ?02/15/22 FOTO 50 (Goal 69 in 11 visits) ?  ?UPPER EXTREMITY ROM:  ?  ?Active ROM Right ?02/15/2022 Left ?02/15/2022  ?Shoulder flexion 135 150  ?Shoulder extension      ?Shoulder abduction      ?Shoulder horizontal adduction 20 40  ?Shoulder internal rotation 35 60  ?Shoulder external rotation 90 80  ?(Blank rows = not tested) ?  ?UPPER EXTREMITY MMT: ?  ?Strength with hand-held  dynamometer in pounds Right ?02/15/2022 Left ?02/15/2022  ?Shoulder flexion      ?Shoulder extension      ?Shoulder abduction      ?Shoulder adduction      ?Shoulder internal rotation 18.1 30.1  ?Shoulder external rotation 20.3 21.4  ?(Blank rows = not tested) ?  ?             ?TODAY'S TREATMENT:  ?02/24/22 ?UBE L3 2.5 min fwd, 2.5 min reverse ?Pulleys 2 min flexion, 2 min abd ?IR stretch with strap behind back 2 min ?Scapular retractions 5 sec X 2 min ?Shoulder IR and ER with green 2X10 ea on Rt ?Bilat shoulder extensions with green 2X10 bilat ?Supine shoulder IR stretch 10 sec X 2 min ?Supine bilat shoulder protraction 3# 2X10 ?Rt shoulder PROM all planes but emphasis on IR with A-P mobs grade 2 ? ?02/15/2022 ?Supine arm raises 20X 3 seconds (serratus anterior) ?Supine IR AROM stretch 10X 10 seconds ?Shoulder blade pinches 10X 5 seconds ?Thumb up the back stretch 10X 10 seconds ?Theraband IR Green 10X 3 seconds slow eccentrics ?  ?Functional Activities: Education as outlined below. ?  ?PATIENT EDUCATION: ?Education details: Reviewed exam findings, shoulder anatomy and imaging.  Discussed mechanism of impingement with probable bursitis/tendonitis, possible partial RTC tear.  Started day 1 HEP. ?Person educated: Patient ?Education method: Explanation, Demonstration, Tactile cues, Verbal cues, and  Handouts ?Education comprehension: verbalized understanding, returned demonstration, verbal cues required, tactile cues required, and needs further education ?  ?  ?HOME EXERCISE PROGRAM: ?Access Code: [email or web address] ?URL: [email or web address] ?Date: 02/15/2022 ?Prepared by: Vista Mink ?  ?Exercises ?- Supine Shoulder Internal Rotation Stretch  - 2 x daily - 7 x weekly - 1 sets - 10-20 reps - 10 seconds hold ?- Standing Shoulder Internal Rotation Stretch with Hands Behind Back  - 2 x daily - 7 x weekly - 1 sets - 10 reps - 10 seconds hold ?- Supine Scapular Protraction in Flexion with Dumbbells  - 2 x daily - 7 x  weekly - 1 sets - 20 reps - 3 seconds hold ?- Standing Scapular Retraction  - 5 x daily - 7 x weekly - 1 sets - 5 reps - 5 second hold ?- Shoulder Internal Rotation with Resistance  - 1 x daily - 7 x weekly - 2 sets - 10 reps - 3 seconds hold ?  ?ASSESSMENT: ?  ?CLINICAL IMPRESSION: ?02/24/22 ?We reviewed his HEP and he shows good overall understanding. He was cautioned about not moving into pain ROM and keeping things in tolerable ROM. He will continue to benefit from PT to build up his [place] mobility and overall shoulder stabilization strength.  ? ?02/15/22 ?Patient is a 71 y.o. male who was seen today for physical therapy evaluation and treatment for R shoulder pain.  Signs and symptoms are consistent with bursitis/tenoditis/possible partial RTC tear and impingement.  Focus of treatment will be to restore normal capsular flexibility, shoulder AROM and improve scapular and RTC strength to meet the below listed goals.  Prognosis is good with the recommended POC. ?  ?  ?OBJECTIVE IMPAIRMENTS decreased endurance, decreased knowledge of condition, decreased ROM, decreased strength, decreased safety awareness, impaired perceived functional ability, impaired flexibility, impaired UE functional use, and pain.  ?  ?ACTIVITY LIMITATIONS community activity and yard work.  ?  ?PERSONAL FACTORS Diabetes may also affect this patient's functional outcome.  ?  ?  ?REHAB POTENTIAL: Good ?  ?CLINICAL DECISION MAKING: Stable/uncomplicated ?  ?EVALUATION COMPLEXITY: Low ?  ?  ?GOALS: ?Goals reviewed with patient? Yes ?  ?SHORT TERM GOALS: Target date: 03/01/2022 ?  ?Improve R shoulder AROM for flexion to 150 degrees, IR to 50 degrees and horizontal adduction to 30 degrees. ?Baseline: See objective ?Goal status: INITIAL ?  ?  ?LONG TERM GOALS: Target date: 03/29/2022 ?  ?Improve FOTO to 69 in 11 visits. ?Baseline: 50 ?Goal status: INITIAL ?  ?2.  Improve R shoulder pain to consistently 0-1/10 on the VAS. ?Baseline: 0-2/10 ?Goal status:  INITIAL ?  ?3.  Improve R shoulder AROM for flexion to 170 degrees; IR to 60 degrees and horizontal adduction to 40 degrees. ?Baseline: See objective ?Goal status: INITIAL ?  ?4.  Improve R shoulder strength to 90% or better of the uninvolved L. ?Baseline: 74% ?Goal status: INITIAL ?  ?5.  [name] will be independent with his long-term HEP at [place]. ?Baseline: Started 02/15/2022 ?Goal status: INITIAL ?  ?PLAN: ?PT FREQUENCY: 1-2x/week ?  ?PT DURATION: 6 weeks ?  ?PLANNED INTERVENTIONS: Therapeutic exercises, Therapeutic activity, Neuromuscular re-education, Patient/Family education, Joint mobilization, Cryotherapy, and Manual therapy ?  ?PLAN FOR NEXT SESSION: how is HEP going, continue to Address posterior capsule tightness, scapular and RTC weakness. ?  ? ? ? ?Debbe Odea, PT,PT,DPT ?02/24/2022, 8:06 AM ? ?  ? ?

## 2022-02-28 ENCOUNTER — Ambulatory Visit (INDEPENDENT_AMBULATORY_CARE_PROVIDER_SITE_OTHER): Payer: Medicare Other

## 2022-02-28 DIAGNOSIS — Z0000 Encounter for general adult medical examination without abnormal findings: Secondary | ICD-10-CM

## 2022-02-28 NOTE — Progress Notes (Addendum)
?I connected with Joseph Sharp today by telephone and verified that I am speaking with the correct person using two identifiers. ?Location patient: home ?Location provider: work ?Persons participating in the virtual visit: patient, provider. ?  ?I discussed the limitations, risks, security and privacy concerns of performing an evaluation and management service by telephone and the availability of in person appointments. I also discussed with the patient that there may be a patient responsible charge related to this service. The patient expressed understanding and verbally consented to this telephonic visit.  ?  ?Interactive audio and video telecommunications were attempted between this provider and patient, however failed, due to patient having technical difficulties OR patient did not have access to video capability.  We continued and completed visit with audio only. ? ?Some vital signs may be absent or patient reported.  ? ?Time Spent with patient on telephone encounter: 30 minutes ? ?Subjective:  ? Joseph Sharp is a 71 y.o. male who presents for Medicare Annual/Subsequent preventive examination. ? ?Review of Systems    ? ?Cardiac Risk Factors include: advanced age (>32mn, >>80women);diabetes mellitus;dyslipidemia;male gender ? ?   ?Objective:  ?  ?There were no vitals filed for this visit. ?There is no height or weight on file to calculate BMI. ? ? ?  02/28/2022  ?  2:52 PM 02/15/2022  ?  9:39 AM 08/28/2018  ?  2:19 PM  ?Advanced Directives  ?Does Patient Have a Medical Advance Directive? No No No  ?Would patient like information on creating a medical advance directive? No - Patient declined No - Patient declined Yes (MAU/Ambulatory/Procedural Areas - Information given)  ? ? ?Current Medications (verified) ?Outpatient Encounter Medications as of 02/28/2022  ?Medication Sig  ? Cyanocobalamin (B-12 PO) Take by mouth.  ? fluticasone (FLONASE) 50 MCG/ACT nasal spray Place 2 sprays into both nostrils  daily.  ? glimepiride (AMARYL) 4 MG tablet TAKE 1 TABLET BY MOUTH AT BEDTIME. (Patient taking differently: Take 4 mg by mouth. Take 1/2 tablet at bedtime)  ? glucose blood test strip 1 each by Other route as needed for other. Use as instructed to check blood sugar once daily.  ? meloxicam (MOBIC) 7.5 MG tablet Take 1 tablet (7.5 mg total) by mouth daily.  ? metFORMIN (GLUCOPHAGE-XR) 500 MG 24 hr tablet TAKE 4 TABLETS BY MOUTH EVERY DAY  ? OZEMPIC, 1 MG/DOSE, 4 MG/3ML SOPN INJECT '1MG'$  INTO THE SKIN ONCE A WEEK  ? pioglitazone (ACTOS) 30 MG tablet TAKE 1 TABLET BY MOUTH EVERY DAY  ? zinc gluconate 50 MG tablet Take by mouth.  ? pravastatin (PRAVACHOL) 20 MG tablet Take 1 tablet (20 mg total) by mouth daily. (Patient not taking: Reported on 02/13/2022)  ? ?No facility-administered encounter medications on file as of 02/28/2022.  ? ? ?Allergies (verified) ?Patient has no known allergies.  ? ?History: ?Past Medical History:  ?Diagnosis Date  ? Allergy   ? [place]   ? ?Past Surgical History:  ?Procedure Laterality Date  ? EYE SURGERY  01/2013  ? Eyelid surgery  ? ?Family History  ?Problem Relation Age of Onset  ? Diabetes Mother   ? Arthritis Mother   ? Diabetes Brother   ? Cancer Neg Hx   ? Heart disease Neg Hx   ? ?Social History  ? ?Socioeconomic History  ? Marital status: Married  ?  Spouse name: LLattie Sharp ? Number of children: 1  ? Years of education: Not on file  ? Highest education level:  Not on file  ?Occupational History  ? Occupation: Engineer, production  ?  Comment: [employer]  ?Tobacco Use  ? Smoking status: Never  ? Smokeless tobacco: Never  ?Substance and Sexual Activity  ? Alcohol use: No  ? Drug use: No  ? Sexual activity: Yes  ?Other Topics Concern  ? Not on file  ?Social History Narrative  ? Lives with his wife.  Their daughter lives in [place], [place].  ? ?Social Determinants of Health  ? ?Financial Resource Strain: Low Risk   ? Difficulty of Paying Living Expenses: Not  hard at all  ?Food Insecurity: No Food Insecurity  ? Worried About Charity fundraiser in the Last Year: Never true  ? Ran Out of Food in the Last Year: Never true  ?Transportation Needs: No Transportation Needs  ? Lack of Transportation (Medical): No  ? Lack of Transportation (Non-Medical): No  ?Physical Activity: Sufficiently Active  ? Days of Exercise per Week: 5 days  ? Minutes of Exercise per Session: 30 min  ?Stress: No Stress Concern Present  ? Feeling of Stress : Not at all  ?Social Connections: Socially Integrated  ? Frequency of Communication with Friends and Family: More than three times a week  ? Frequency of Social Gatherings with Friends and Family: More than three times a week  ? Attends Religious Services: More than 4 times per year  ? Active Member of Clubs or Organizations: Yes  ? Attends Archivist Meetings: More than 4 times per year  ? Marital Status: Married  ? ? ?Tobacco Counseling ?Counseling given: Not Answered ? ? ?Clinical Intake: ? ?Pre-visit preparation completed: Yes ? ?Pain : No/denies pain ? ?  ? ?Nutritional Risks: None ?Diabetes: Yes ?CBG done?: No ?Did pt. bring in CBG monitor from home?: No ? ?How often do you need to have someone help you when you read instructions, pamphlets, or other written materials from your doctor or pharmacy?: 1 - Never ?What is the last grade level you completed in school?: Bachelor of Science Degree ? ?Diabetic? yes ? ?Interpreter Needed?: No ? ?Information entered by :: Lisette Abu, LPN ? ? ?Activities of Daily Living ? ?  02/28/2022  ?  3:04 PM  ?In your present state of health, do you have any difficulty performing the following activities:  ?Hearing? 0  ?Vision? 0  ?Difficulty concentrating or making decisions? 0  ?Walking or climbing stairs? 0  ?Dressing or bathing? 0  ?Doing errands, shopping? 0  ?Preparing Food and eating ? N  ?Using the Toilet? N  ?In the past six months, have you accidently leaked urine? N  ?Do you have problems  with loss of bowel control? N  ?Managing your Medications? N  ?Managing your Finances? N  ?Housekeeping or managing your Housekeeping? N  ? ? ?Patient Care Team: ?[name], MD as PCP - General (Family Medicine) ?Elayne Snare, MD as Consulting Physician (Endocrinology) ? ?Indicate any recent Medical Services you may have received from other than [facility] providers in the past year (date may be approximate). ? ?   ?Assessment:  ? This is a routine wellness examination for [facility]. ? ?Hearing/Vision screen ?Hearing Screening - Comments:: Patient denied any hearing difficulty.   ?No hearing aids. ? ?Vision Screening - Comments:: Patient does wear corrective lenses/contacts.  ?Eye exam done by: [facility] ? ? ?Dietary issues and exercise activities discussed: ?Current Exercise Habits: Home exercise routine, Type of exercise: walking, Time (Minutes): 30, Frequency (Times/Week): 5, Weekly Exercise (  Minutes/Week): 150, Intensity: Moderate, Exercise limited by: None identified ? ? Goals Addressed   ? ?  ?  ?  ?  ? This Visit's Progress  ?  My goal is to keep my weight down and get down to 165 pounds.     ? ?  ?Depression Screen ? ?  02/28/2022  ?  2:57 PM 01/23/2022  ?  1:44 PM 03/11/2020  ?  4:24 PM 08/28/2018  ?  2:17 PM 03/08/2018  ? 10:58 AM 02/21/2018  ?  9:20 AM 01/03/2018  ?  8:35 AM  ?PHQ 2/9 Scores  ?PHQ - 2 Score 0 0 0 0 0 0 0  ?PHQ- 9 Score  2       ?  ?Fall Risk ? ?  02/28/2022  ?  2:55 PM 01/23/2022  ?  1:44 PM 03/11/2020  ?  4:23 PM 02/21/2018  ?  9:20 AM 01/03/2018  ?  8:35 AM  ?Fall Risk   ?Falls in the past year? 0 0 0 No No  ?Number falls in past yr: 0 0     ?Injury with Fall? 0 0     ?Risk for fall due to : No Fall Risks No Fall Risks     ?Follow up Falls evaluation completed Falls evaluation completed Falls evaluation completed    ? ? ?FALL RISK PREVENTION PERTAINING TO THE HOME: ? ?Any stairs in or around the home? Yes  ?If so, are there any without handrails? No  ?Home free of loose throw rugs in  walkways, pet beds, electrical cords, etc? Yes  ?Adequate lighting in your home to reduce risk of falls? Yes  ? ?ASSISTIVE DEVICES UTILIZED TO PREVENT FALLS: ? ?Life alert? No  ?Use of a cane, walker or w/c? No  ?

## 2022-02-28 NOTE — Patient Instructions (Signed)
[name] , ?Thank you for taking time to come for your Medicare Wellness Visit. I appreciate your ongoing commitment to your health goals. Please review the following plan we discussed and let me know if I can assist you in the future.  ? ?Screening recommendations/referrals: ?Colonoscopy: 10/20/2020; due every 10 years ?Recommended yearly ophthalmology/optometry visit for glaucoma screening and checkup ?Recommended yearly dental visit for hygiene and checkup ? ?Vaccinations: ?Influenza vaccine: 08/21/2021 ?Pneumococcal vaccine: 04/26/2016, 08/28/2018 ?Tdap vaccine: due ?Shingles vaccine: due ?Zoster vaccine: 09/04/2014   ?Covid-19: 12/03/2019, 12/24/2019, 08/21/2021 ? ?Advanced directives: No ? ?Conditions/risks identified: Yes ? ?Next appointment: Please schedule your next Medicare Wellness Visit with your Nurse Health Advisor in 1 year by calling [phone]. ? ?Preventive Care 69 Years and Older, Male ?Preventive care refers to lifestyle choices and visits with your health care provider that can promote health and wellness. ?What does preventive care include? ?A yearly physical exam. This is also called an annual well check. ?Dental exams once or twice a year. ?Routine eye exams. Ask your health care provider how often you should have your eyes checked. ?Personal lifestyle choices, including: ?Daily care of your teeth and gums. ?Regular physical activity. ?Eating a healthy diet. ?Avoiding tobacco and drug use. ?Limiting alcohol use. ?Practicing safe sex. ?Taking low doses of aspirin every day. ?Taking vitamin and mineral supplements as recommended by your health care provider. ?What happens during an annual well check? ?The services and screenings done by your health care provider during your annual well check will depend on your age, overall health, lifestyle risk factors, and family history of disease. ?Counseling  ?Your health care provider may ask you questions about your: ?Alcohol use. ?Tobacco use. ?Drug  use. ?Emotional well-being. ?Home and relationship well-being. ?Sexual activity. ?Eating habits. ?History of falls. ?Memory and ability to understand (cognition). ?Work and work Statistician. ?Screening  ?You may have the following tests or measurements: ?Height, weight, and BMI. ?Blood pressure. ?Lipid and cholesterol levels. These may be checked every 5 years, or more frequently if you are over 22 years old. ?Skin check. ?Lung cancer screening. You may have this screening every year starting at age 45 if you have a 30-pack-year history of smoking and currently smoke or have quit within the past 15 years. ?Fecal occult blood test (FOBT) of the stool. You may have this test every year starting at age 37. ?Flexible sigmoidoscopy or colonoscopy. You may have a sigmoidoscopy every 5 years or a colonoscopy every 10 years starting at age 55. ?Prostate cancer screening. Recommendations will vary depending on your family history and other risks. ?Hepatitis C blood test. ?Hepatitis B blood test. ?Sexually transmitted disease (STD) testing. ?Diabetes screening. This is done by checking your blood sugar (glucose) after you have not eaten for a while (fasting). You may have this done every 1-3 years. ?Abdominal aortic aneurysm (AAA) screening. You may need this if you are a current or former smoker. ?Osteoporosis. You may be screened starting at age 11 if you are at high risk. ?Talk with your health care provider about your test results, treatment options, and if necessary, the need for more tests. ?Vaccines  ?Your health care provider may recommend certain vaccines, such as: ?Influenza vaccine. This is recommended every year. ?Tetanus, diphtheria, and acellular pertussis (Tdap, Td) vaccine. You may need a Td booster every 10 years. ?Zoster vaccine. You may need this after age 75. ?Pneumococcal 13-valent conjugate (PCV13) vaccine. One dose is recommended after age 64. ?Pneumococcal polysaccharide (PPSV23) vaccine. One dose  is  recommended after age 24. ?Talk to your health care provider about which screenings and vaccines you need and how often you need them. ?This information is not intended to replace advice given to you by your health care provider. Make sure you discuss any questions you have with your health care provider. ?Document Released: 11/26/2015 Document Revised: 07/19/2016 Document Reviewed: 08/31/2015 ?Elsevier Interactive Patient Education ? 2017 [place]. ? ?Fall Prevention in the Home ?Falls can cause injuries. They can happen to people of all ages. There are many things you can do to make your home safe and to help prevent falls. ?What can I do on the outside of my home? ?Regularly fix the edges of walkways and driveways and fix any cracks. ?Remove anything that might make you trip as you walk through a door, such as a raised step or threshold. ?Trim any bushes or trees on the path to your home. ?Use bright outdoor lighting. ?Clear any walking paths of anything that might make someone trip, such as rocks or tools. ?Regularly check to see if handrails are loose or broken. Make sure that both sides of any steps have handrails. ?Any raised decks and porches should have guardrails on the edges. ?Have any leaves, snow, or ice cleared regularly. ?Use sand or salt on walking paths during winter. ?Clean up any spills in your garage right away. This includes oil or grease spills. ?What can I do in the bathroom? ?Use night lights. ?Install grab bars by the toilet and in the tub and shower. Do not use towel bars as grab bars. ?Use non-skid mats or decals in the tub or shower. ?If you need to sit down in the shower, use a plastic, non-slip stool. ?Keep the floor dry. Clean up any water that spills on the floor as soon as it happens. ?Remove soap buildup in the tub or shower regularly. ?Attach bath mats securely with double-sided non-slip rug tape. ?Do not have throw rugs and other things on the floor that can make you  trip. ?What can I do in the bedroom? ?Use night lights. ?Make sure that you have a light by your bed that is easy to reach. ?Do not use any sheets or blankets that are too big for your bed. They should not hang down onto the floor. ?Have a firm chair that has side arms. You can use this for support while you get dressed. ?Do not have throw rugs and other things on the floor that can make you trip. ?What can I do in the kitchen? ?Clean up any spills right away. ?Avoid walking on wet floors. ?Keep items that you use a lot in easy-to-reach places. ?If you need to reach something above you, use a strong step stool that has a grab bar. ?Keep electrical cords out of the way. ?Do not use floor polish or wax that makes floors slippery. If you must use wax, use non-skid floor wax. ?Do not have throw rugs and other things on the floor that can make you trip. ?What can I do with my stairs? ?Do not leave any items on the stairs. ?Make sure that there are handrails on both sides of the stairs and use them. Fix handrails that are broken or loose. Make sure that handrails are as long as the stairways. ?Check any carpeting to make sure that it is firmly attached to the stairs. Fix any carpet that is loose or worn. ?Avoid having throw rugs at the top or bottom of the stairs.  If you do have throw rugs, attach them to the floor with carpet tape. ?Make sure that you have a light switch at the top of the stairs and the bottom of the stairs. If you do not have them, ask someone to add them for you. ?What else can I do to help prevent falls? ?Wear shoes that: ?Do not have high heels. ?Have rubber bottoms. ?Are comfortable and fit you well. ?Are closed at the toe. Do not wear sandals. ?If you use a stepladder: ?Make sure that it is fully opened. Do not climb a closed stepladder. ?Make sure that both sides of the stepladder are locked into place. ?Ask someone to hold it for you, if possible. ?Clearly mark and make sure that you can  see: ?Any grab bars or handrails. ?First and last steps. ?Where the edge of each step is. ?Use tools that help you move around (mobility aids) if they are needed. These include: ?Canes. ?Walkers. ?Scooters. ?Crutches.

## 2022-03-01 ENCOUNTER — Encounter: Payer: Self-pay | Admitting: Physical Therapy

## 2022-03-01 ENCOUNTER — Ambulatory Visit (INDEPENDENT_AMBULATORY_CARE_PROVIDER_SITE_OTHER): Payer: Medicare Other | Admitting: Physical Therapy

## 2022-03-01 DIAGNOSIS — M25511 Pain in right shoulder: Secondary | ICD-10-CM

## 2022-03-01 DIAGNOSIS — M25611 Stiffness of right shoulder, not elsewhere classified: Secondary | ICD-10-CM

## 2022-03-01 DIAGNOSIS — M6281 Muscle weakness (generalized): Secondary | ICD-10-CM | POA: Diagnosis not present

## 2022-03-01 NOTE — Therapy (Signed)
?OUTPATIENT PHYSICAL THERAPY TREATMENT NOTE ? ? ?Patient Name: Joseph Sharp ?MRN: [id number] ?DOB:Aug 11, 1951, 71 y.o., male ?Today's Date: 03/01/2022 ? ?PCP: [name], MD ?REFERRING PROVIDER: [name], MD ? ?END OF SESSION:  ? PT End of Session - 03/01/22 0845   ? ? Visit Number 3   ? Number of Visits 10   ? Date for PT Re-Evaluation 03/29/22   ? PT Start Time 0845   ? PT Stop Time 0930   ? PT Time Calculation (min) 45 min   ? Activity Tolerance Patient tolerated treatment well;No increased pain   ? Behavior During Therapy [facility] for tasks assessed/performed   ? ?  ?  ? ?  ? ? ?Past Medical History:  ?Diagnosis Date  ? Allergy   ? Diabetes mellitus without complication ([place])   ? ?Past Surgical History:  ?Procedure Laterality Date  ? EYE SURGERY  01/2013  ? Eyelid surgery  ? ?Patient Active Problem List  ? Diagnosis Date Noted  ? ED (erectile dysfunction) of organic origin 09/04/2014  ? Allergic rhinitis 08/10/2014  ? Diabetes mellitus, stable ([place]) 06/17/2013  ? Pure hypercholesterolemia 06/17/2013  ? ? ?REFERRING DIAG:  ?M25.511,G89.29 (ICD-10-CM) - Chronic right shoulder pain  ?M75.81 (ICD-10-CM) - Right rotator cuff tendinitis  ? ? ?THERAPY DIAG:  ?Stiffness of right shoulder, not elsewhere classified ? ?Right shoulder pain, unspecified chronicity ? ?Muscle weakness (generalized) ? ?PERTINENT HISTORY: Diabetes, previous R shoulder pain ? ?PRECAUTIONS: None ? ?SUBJECTIVE: relays his shoulder is feeling good, everything else is sore from working on a shed but his shoulder did good during all of this ? ?PAIN:  ?Are you having pain? No pain at rest upon arrival ? ? ? ? ? ?OBJECTIVE:  ? ?  ?PATIENT SURVEYS:  ?02/15/22 FOTO 50 (Goal 69 in 11 visits) ?  ?UPPER EXTREMITY ROM:  ?  ?Active ROM Right ?02/15/2022 Left ?02/15/2022 Right ?03/01/22  ?Shoulder flexion 135 150 155  ?Shoulder extension       ?Shoulder abduction       ?Shoulder horizontal adduction 20 40   ?Shoulder internal rotation 35 60 62   ?Shoulder external rotation 90 80   ?(Blank rows = not tested) ?  ?UPPER EXTREMITY MMT: ?  ?Strength with hand-held dynamometer in pounds Right ?02/15/2022 Left ?02/15/2022  ?Shoulder flexion      ?Shoulder extension      ?Shoulder abduction      ?Shoulder adduction      ?Shoulder internal rotation 18.1 30.1  ?Shoulder external rotation 20.3 21.4  ?(Blank rows = not tested) ?  ?             ?TODAY'S TREATMENT:  ?03/01/22 ?Pulleys 2 min flexion, 2 min abd ?IR stretch with strap behind back 2 min ?Shoulder flexion stretch at doorway 5 sec X10 on Rt ?Shoulder Rows Green 2X10 ?Shoulder IR and ER with green 2X10 ea on Rt ?Bilat shoulder extensions with green 2X10 bilat ?Supine shoulder IR stretch 10 sec X 2 min ?Supine bilat shoulder protraction 4# 2X10 ?Rt shoulder PROM all planes but emphasis on IR with A-P mobs grade 2 ? ?02/24/22 ?UBE L3 2.5 min fwd, 2.5 min reverse ?Pulleys 2 min flexion, 2 min abd ?IR stretch with strap behind back 5 sec X10 ?Scapular retractions 5 sec X 2 min ?Shoulder IR and ER with green 2X10 ea on Rt ?Bilat shoulder extensions with green 2X10 bilat ?Supine shoulder IR stretch 10 sec X 2 min ?Supine bilat shoulder protraction 3#  2X10 ?Rt shoulder PROM all planes but emphasis on IR with A-P mobs grade 2 ?  ?PATIENT EDUCATION: ?Education details: Reviewed exam findings, shoulder anatomy and imaging.  Discussed mechanism of impingement with probable bursitis/tendonitis, possible partial RTC tear.  Started day 1 HEP. ?Person educated: Patient ?Education method: Explanation, Demonstration, Tactile cues, Verbal cues, and Handouts ?Education comprehension: verbalized understanding, returned demonstration, verbal cues required, tactile cues required, and needs further education ?  ?  ?HOME EXERCISE PROGRAM: ?Access Code: [email or web address] ?URL: [email or web address] ?Date: 02/15/2022 ?Prepared by: Vista Mink ?  ?Exercises ?- Supine Shoulder Internal Rotation Stretch  - 2 x daily - 7 x weekly - 1 sets  - 10-20 reps - 10 seconds hold ?- Standing Shoulder Internal Rotation Stretch with Hands Behind Back  - 2 x daily - 7 x weekly - 1 sets - 10 reps - 10 seconds hold ?- Supine Scapular Protraction in Flexion with Dumbbells  - 2 x daily - 7 x weekly - 1 sets - 20 reps - 3 seconds hold ?- Standing Scapular Retraction  - 5 x daily - 7 x weekly - 1 sets - 5 reps - 5 second hold ?- Shoulder Internal Rotation with Resistance  - 1 x daily - 7 x weekly - 2 sets - 10 reps - 3 seconds hold ?  ?ASSESSMENT: ?  ?CLINICAL IMPRESSION: ?03/01/22 ?He appears to be making good early progress with PT and his HEP for his Rt shoulder. He has now met his short term ROM goal for flexion but still with some limitations in IR. We will continue to work to improve his Melvin mobility and shoulder strength to improve function. ? ?02/15/22 ?Patient is a 71 y.o. male who was seen today for physical therapy evaluation and treatment for R shoulder pain.  Signs and symptoms are consistent with bursitis/tenoditis/possible partial RTC tear and impingement.  Focus of treatment will be to restore normal capsular flexibility, shoulder AROM and improve scapular and RTC strength to meet the below listed goals.  Prognosis is good with the recommended POC. ?  ?  ?OBJECTIVE IMPAIRMENTS decreased endurance, decreased knowledge of condition, decreased ROM, decreased strength, decreased safety awareness, impaired perceived functional ability, impaired flexibility, impaired UE functional use, and pain.  ?  ?ACTIVITY LIMITATIONS community activity and yard work.  ?  ?PERSONAL FACTORS Diabetes may also affect this patient's functional outcome.  ?  ?  ?REHAB POTENTIAL: Good ?  ?CLINICAL DECISION MAKING: Stable/uncomplicated ?  ?EVALUATION COMPLEXITY: Low ?  ?  ?GOALS: ?Goals reviewed with patient? Yes ?  ?SHORT TERM GOALS: Target date: 03/01/2022 ?  ?Improve R shoulder AROM for flexion to 150 degrees, IR to 50 degrees and horizontal adduction to 30 degrees. ?Baseline: See  objective ?Goal status: MET 03/01/22 ?  ?  ?LONG TERM GOALS: Target date: 03/29/2022 ?  ?Improve FOTO to 69 in 11 visits. ?Baseline: 50 ?Goal status: ongoing ?  ?2.  Improve R shoulder pain to consistently 0-1/10 on the VAS. ?Baseline: 0-2/10 ?Goal status: ongoing ?  ?3.  Improve R shoulder AROM for flexion to 170 degrees; IR to 60 degrees and horizontal adduction to 40 degrees. ?Baseline: See objective ?Goal status: ongoing ?  ?4.  Improve R shoulder strength to 90% or better of the uninvolved L. ?Baseline: 74% ?Goal status: ongoing ?  ?5.  [name] will be independent with his long-term HEP at [facility]. ?Baseline: Started 02/15/2022 ?Goal status: ongoing ?  ?PLAN: ?PT FREQUENCY: 1-2x/week ?  ?PT DURATION: 6 weeks ?  ?PLANNED  INTERVENTIONS: Therapeutic exercises, Therapeutic activity, Neuromuscular re-education, Patient/Family education, Joint mobilization, Cryotherapy, and Manual therapy ?  ?PLAN FOR NEXT SESSION: how is HEP going, continue to Address posterior capsule tightness, scapular and RTC weakness. ?  ? ? ? ?Debbe Odea, PT,PT,DPT ?03/01/2022, 8:46 AM ? ?  ? ?

## 2022-03-03 ENCOUNTER — Encounter: Payer: Self-pay | Admitting: Rehabilitative and Restorative Service Providers"

## 2022-03-03 ENCOUNTER — Other Ambulatory Visit: Payer: Self-pay

## 2022-03-03 ENCOUNTER — Ambulatory Visit (INDEPENDENT_AMBULATORY_CARE_PROVIDER_SITE_OTHER): Payer: Medicare Other | Admitting: Rehabilitative and Restorative Service Providers"

## 2022-03-03 DIAGNOSIS — M6281 Muscle weakness (generalized): Secondary | ICD-10-CM

## 2022-03-03 DIAGNOSIS — M25511 Pain in right shoulder: Secondary | ICD-10-CM | POA: Diagnosis not present

## 2022-03-03 DIAGNOSIS — M25611 Stiffness of right shoulder, not elsewhere classified: Secondary | ICD-10-CM

## 2022-03-03 NOTE — Therapy (Signed)
?OUTPATIENT PHYSICAL THERAPY TREATMENT NOTE ? ? ?Patient Name: Joseph Sharp ?MRN: [id number] ?DOB:04-29-1951, 71 y.o., male ?Today's Date: 03/03/2022 ? ?PCP: [name], MD ?REFERRING PROVIDER: [name], MD ? ?END OF SESSION:  ? PT End of Session - 03/03/22 0859   ? ? Visit Number 4   ? Number of Visits 10   ? Date for PT Re-Evaluation 03/29/22   ? PT Start Time [phone]   ? PT Stop Time [phone]   ? PT Time Calculation (min) 39 min   ? Activity Tolerance Patient tolerated treatment well   ? Behavior During Therapy [facility] for tasks assessed/performed   ? ?  ?  ? ?  ? ? ? ?Past Medical History:  ?Diagnosis Date  ? Allergy   ? Diabetes mellitus without complication ([place])   ? ?Past Surgical History:  ?Procedure Laterality Date  ? EYE SURGERY  01/2013  ? Eyelid surgery  ? ?Patient Active Problem List  ? Diagnosis Date Noted  ? ED (erectile dysfunction) of organic origin 09/04/2014  ? Allergic rhinitis 08/10/2014  ? Diabetes mellitus, stable ([place]) 06/17/2013  ? Pure hypercholesterolemia 06/17/2013  ? ? ?REFERRING DIAG:  ?M25.511,G89.29 (ICD-10-CM) - Chronic right shoulder pain  ?M75.81 (ICD-10-CM) - Right rotator cuff tendinitis  ? ? ?THERAPY DIAG:  ?Stiffness of right shoulder, not elsewhere classified ? ?Right shoulder pain, unspecified chronicity ? ?Muscle weakness (generalized) ? ?PERTINENT HISTORY: Diabetes, previous Rt shoulder pain ? ?PRECAUTIONS: None ? ?SUBJECTIVE:  Pt indicated no pain upon arrival today.  Pt indicated overall improvement at 40% at this time.  Pt didn't report too much specific pain or limitation in daily activity.  Reported tightness c reaching behind back stretching.  ? ?PAIN:  ?Are you having pain? No pain at rest upon arrival ? ? ?OBJECTIVE:  ?  ?PATIENT SURVEYS:  ?03/03/2022: update 68% ? ?02/15/22 FOTO 50 (Goal 69 in 11 visits) ?  ?UPPER EXTREMITY ROM:  ?  ?Active ROM Right ?02/15/2022 Left ?02/15/2022 Right ?03/01/22  ?Shoulder flexion 135 150 155  ?Shoulder extension       ?Shoulder  abduction       ?Shoulder horizontal adduction 20 40   ?Shoulder internal rotation 35 60 62  ?Shoulder external rotation 90 80   ?(Blank rows = not tested) ? ?03/03/2022: HBB Rt: T10, Lt: T5 ?  ?UPPER EXTREMITY MMT: ?  ? Right ?02/15/2022 Left ?02/15/2022 Right ?03/03/2022 Left ?03/03/2022  ?Shoulder flexion     5/5 5/5  ?Shoulder extension        ?Shoulder abduction     4/5 5/5  ?Shoulder adduction        ?Shoulder internal rotation 18.1 lbs 30.1 lbs 4/5 5/5  ?Shoulder external rotation 20.3 lbs 21.4 lbs 5/5 5/5  ?(Blank rows = not tested) ?  ?             ?TODAY'S TREATMENT:  ?03/03/22 ?Therex: ?Pulleys 2 min flexion, 2 min abd ?Standing gh Rows [place] 2 x 15  ?Standing gh ext green band 2 x 15 ?Standing shoulder IR c towel at side green band slow control eccentric focus 2 x 15 ?Standing shoulder ER c towel at side green band slow control eccentric focus 2 x 15 ?Standing wall push up c SA press hold 2-3 seconds 2 x 10 ?IR rope stretch Rt behind back 30 sec hold x 3 ?Posterior capsule stretch across body Rt 30 sec x 3 ? ? ?03/01/22 ?Pulleys 2 min flexion, 2 min abd ?IR stretch  with strap behind back 2 min ?Shoulder flexion stretch at doorway 5 sec X10 on Rt ?Shoulder R[place] 2X10 ?Shoulder IR and ER with green 2X10 ea on Rt ?Bilat shoulder extensions with green 2X10 bilat ?Supine shoulder IR stretch 10 sec X 2 min ?Supine bilat shoulder protraction 4# 2X10 ?Rt shoulder PROM all planes but emphasis on IR with A-P mobs grade 2 ? ?02/24/22 ?UBE L3 2.5 min fwd, 2.5 min reverse ?Pulleys 2 min flexion, 2 min abd ?IR stretch with strap behind back 5 sec X10 ?Scapular retractions 5 sec X 2 min ?Shoulder IR and ER with green 2X10 ea on Rt ?Bilat shoulder extensions with green 2X10 bilat ?Supine shoulder IR stretch 10 sec X 2 min ?Supine bilat shoulder protraction 3# 2X10 ?Rt shoulder PROM all planes but emphasis on IR with A-P mobs grade 2 ?  ?PATIENT EDUCATION: ?02/24/2022 ?Education details: Reviewed exam findings, shoulder  anatomy and imaging.  Discussed mechanism of impingement with probable bursitis/tendonitis, possible partial RTC tear.  Started day 1 HEP. ?Person educated: Patient ?Education method: Explanation, Demonstration, Tactile cues, Verbal cues, and Handouts ?Education comprehension: verbalized understanding, returned demonstration, verbal cues required, tactile cues required, and needs further education ?  ?  ?HOME EXERCISE PROGRAM: ?02/24/2022 ?Access Code: [email or web address] ?URL: [email or web address] ?Date: 02/15/2022 ?Prepared by: Vista Mink ?  ?Exercises ?- Supine Shoulder Internal Rotation Stretch  - 2 x daily - 7 x weekly - 1 sets - 10-20 reps - 10 seconds hold ?- Standing Shoulder Internal Rotation Stretch with Hands Behind Back  - 2 x daily - 7 x weekly - 1 sets - 10 reps - 10 seconds hold ?- Supine Scapular Protraction in Flexion with Dumbbells  - 2 x daily - 7 x weekly - 1 sets - 20 reps - 3 seconds hold ?- Standing Scapular Retraction  - 5 x daily - 7 x weekly - 1 sets - 5 reps - 5 second hold ?- Shoulder Internal Rotation with Resistance  - 1 x daily - 7 x weekly - 2 sets - 10 reps - 3 seconds hold ?  ?ASSESSMENT: ?  ?CLINICAL IMPRESSION: ?Overall mobility showing well with mild deficits in Rt reaching behind back vs. Lt but functional ability present for tucking shirt in, self care activity from Rt arm.  Strength in Rt arm lacking in a few directions as noted in objective chart.  Continued skilled PT services warranted with focus on long term HEP planning when appropriate.  ?  ?  ?OBJECTIVE IMPAIRMENTS decreased endurance, decreased knowledge of condition, decreased ROM, decreased strength, decreased safety awareness, impaired perceived functional ability, impaired flexibility, impaired UE functional use, and pain.  ?  ?ACTIVITY LIMITATIONS community activity and yard work.  ?  ?PERSONAL FACTORS Diabetes may also affect this patient's functional outcome.  ?  ?  ?REHAB POTENTIAL: Good ?  ?CLINICAL  DECISION MAKING: Stable/uncomplicated ?  ?EVALUATION COMPLEXITY: Low ?  ?  ?GOALS: ?Goals reviewed with patient? Yes ?  ?SHORT TERM GOALS: Target date: 03/01/2022 ?  ?Improve R shoulder AROM for flexion to 150 degrees, IR to 50 degrees and horizontal adduction to 30 degrees. ?Baseline: See objective ?Goal status: MET 03/01/22 ?  ?  ?LONG TERM GOALS: Target date: 03/29/2022 ?  ?Improve FOTO to 69 in 11 visits. ?Baseline: 50 ?Goal status: ongoing ?  ?2.  Improve Rt shoulder pain to consistently 0-1/10 on the VAS. ?Baseline: 0-2/10 ?Goal status: ongoing ?  ?3.  Improve Rt shoulder AROM for flexion to 170 degrees; IR  to 60 degrees and horizontal adduction to 40 degrees. ?Baseline: See objective ?Goal status: ongoing ?  ?4.  Improve Rt shoulder strength to 90% or better of the uninvolved L. ?Baseline: 74% ?Goal status: ongoing ?  ?5.  [name] will be independent with his long-term HEP at DC. ?Baseline: Started 02/15/2022 ?Goal status: ongoing ?  ?PLAN: ?PT FREQUENCY: 1-2x/week ?  ?PT DURATION: 6 weeks ?  ?PLANNED INTERVENTIONS: Therapeutic exercises, Therapeutic activity, Neuromuscular re-education, Patient/Family education, Joint mobilization, Cryotherapy, and Manual therapy ?  ?PLAN FOR NEXT SESSION: IR/hand behind back mobility, progressive strengthening c HEP transitioning focus as able.  ?  ?Scot Jun, PT, DPT, OCS, ATC ?03/03/22  9:24 AM ? ? ?  ? ?

## 2022-03-06 ENCOUNTER — Encounter: Payer: Medicare Other | Admitting: Physical Therapy

## 2022-03-10 ENCOUNTER — Other Ambulatory Visit: Payer: Self-pay

## 2022-03-10 ENCOUNTER — Ambulatory Visit (INDEPENDENT_AMBULATORY_CARE_PROVIDER_SITE_OTHER): Payer: Medicare Other | Admitting: Rehabilitative and Restorative Service Providers"

## 2022-03-10 ENCOUNTER — Encounter: Payer: Self-pay | Admitting: Rehabilitative and Restorative Service Providers"

## 2022-03-10 DIAGNOSIS — M6281 Muscle weakness (generalized): Secondary | ICD-10-CM | POA: Diagnosis not present

## 2022-03-10 DIAGNOSIS — M25511 Pain in right shoulder: Secondary | ICD-10-CM | POA: Diagnosis not present

## 2022-03-10 DIAGNOSIS — M25611 Stiffness of right shoulder, not elsewhere classified: Secondary | ICD-10-CM | POA: Diagnosis not present

## 2022-03-10 NOTE — Therapy (Addendum)
OUTPATIENT PHYSICAL THERAPY TREATMENT NOTE   Patient Name: Joseph Sharp MRN: [id number] DOB:10-21-1951, 71 y.o., male Today's Date: 03/10/2022  PCP: [name], MD REFERRING PROVIDER: [name], MD  PHYSICAL THERAPY DISCHARGE SUMMARY  Visits from Start of Care: 5  Current functional level related to goals / functional outcomes: See note   Remaining deficits: See note   Education / Equipment: HEP   Patient agrees to discharge. Patient goals were partially met. Patient is being discharged due to being pleased with the current functional level.   END OF SESSION:   PT End of Session - 03/10/22 0847     Visit Number 5    Number of Visits 10    Date for PT Re-Evaluation 03/29/22    PT Start Time 0845    PT Stop Time 0911    PT Time Calculation (min) 26 min    Activity Tolerance Patient tolerated treatment well    Behavior During Therapy [facility] for tasks assessed/performed               Past Medical History:  Diagnosis Date   Allergy    Diabetes mellitus without complication ([place])    Past Surgical History:  Procedure Laterality Date   EYE SURGERY  01/2013   Eyelid surgery   Patient Active Problem List   Diagnosis Date Noted   ED (erectile dysfunction) of organic origin 09/04/2014   Allergic rhinitis 08/10/2014   Diabetes mellitus, stable ([place]) 06/17/2013   Pure hypercholesterolemia 06/17/2013    REFERRING DIAG:  M25.511,G89.29 (ICD-10-CM) - Chronic right shoulder pain  M75.81 (ICD-10-CM) - Right rotator cuff tendinitis    THERAPY DIAG:  Stiffness of right shoulder, not elsewhere classified  Right shoulder pain, unspecified chronicity  Muscle weakness (generalized)  PERTINENT HISTORY: Diabetes, previous Rt shoulder pain  PRECAUTIONS: None  SUBJECTIVE:  Pt indicated no pain complaints upon arrival today.  Still having some tightness behind back reported.   PAIN:  Are you having pain? No pain at rest upon  arrival   OBJECTIVE:    PATIENT SURVEYS:  03/03/2022: update 68%  02/15/22 FOTO 50 (Goal 69 in 11 visits)   UPPER EXTREMITY ROM:    Active ROM Right 02/15/2022 Left 02/15/2022 Right 03/01/22  Shoulder flexion 135 150 155  Shoulder extension       Shoulder abduction       Shoulder horizontal adduction 20 40   Shoulder internal rotation 35 60 62  Shoulder external rotation 90 80   (Blank rows = not tested)  03/03/2022: HBB Rt: T10, Lt: T5   UPPER EXTREMITY MMT:    Right 02/15/2022 Left 02/15/2022 Right 03/03/2022 Left 03/03/2022  Shoulder flexion     5/5 5/5  Shoulder extension        Shoulder abduction     4/5 5/5  Shoulder adduction        Shoulder internal rotation 18.1 lbs 30.1 lbs 4/5 5/5  Shoulder external rotation 20.3 lbs 21.4 lbs 5/5 5/5  (Blank rows = not tested)                TODAY'S TREATMENT:  03/10/22 Therex: UBE fwd/back 4 mins each way lvl 2.5 Review of existing HEP for trial HEP.  Additional time spent in review of program on [place] website v visual watching of videos and verbal discussion of techniques.  Pt verbalized understanding and comprehension of activity for plan of use at home.   03/03/22 Therex: Pulleys 2 min flexion, 2 min abd  Standing gh Rows [place] 2 x 15  Standing gh ext green band 2 x 15 Standing shoulder IR c towel at side green band slow control eccentric focus 2 x 15 Standing shoulder ER c towel at side green band slow control eccentric focus 2 x 15 Standing wall push up c SA press hold 2-3 seconds 2 x 10 IR rope stretch Rt behind back 30 sec hold x 3 Posterior capsule stretch across body Rt 30 sec x 3   03/01/22 Pulleys 2 min flexion, 2 min abd IR stretch with strap behind back 2 min Shoulder flexion stretch at doorway 5 sec X10 on Rt Shoulder Rows [place] 2X10 Shoulder IR and ER with green 2X10 ea on Rt Bilat shoulder extensions with green 2X10 bilat Supine shoulder IR stretch 10 sec X 2 min Supine bilat shoulder protraction 4#  2X10 Rt shoulder PROM all planes but emphasis on IR with A-P mobs grade 2   PATIENT EDUCATION: 02/24/2022 Education details: Reviewed exam findings, shoulder anatomy and imaging.  Discussed mechanism of impingement with probable bursitis/tendonitis, possible partial RTC tear.  Started day 1 HEP. Person educated: Patient Education method: Explanation, Demonstration, Tactile cues, Verbal cues, and Handouts Education comprehension: verbalized understanding, returned demonstration, verbal cues required, tactile cues required, and needs further education     HOME EXERCISE PROGRAM: 02/24/2022 Access Code: [email or web address] URL: [email or web address] Date: 02/15/2022 Prepared by: Vista Mink   Exercises - Supine Shoulder Internal Rotation Stretch  - 2 x daily - 7 x weekly - 1 sets - 10-20 reps - 10 seconds hold - Standing Shoulder Internal Rotation Stretch with Hands Behind Back  - 2 x daily - 7 x weekly - 1 sets - 10 reps - 10 seconds hold - Supine Scapular Protraction in Flexion with Dumbbells  - 2 x daily - 7 x weekly - 1 sets - 20 reps - 3 seconds hold - Standing Scapular Retraction  - 5 x daily - 7 x weekly - 1 sets - 5 reps - 5 second hold - Shoulder Internal Rotation with Resistance  - 1 x daily - 7 x weekly - 2 sets - 10 reps - 3 seconds hold   ASSESSMENT:   CLINICAL IMPRESSION: Pt has self reported good improvement to this point.  Noted improvement in mobility overall at this time.  Due to minimal pain complaints and overall gains, attempt at trial HEP at this time with return in 2-3 weeks for follow up to check status.    OBJECTIVE IMPAIRMENTS decreased endurance, decreased knowledge of condition, decreased ROM, decreased strength, decreased safety awareness, impaired perceived functional ability, impaired flexibility, impaired UE functional use, and pain.    ACTIVITY LIMITATIONS community activity and yard work.    PERSONAL FACTORS Diabetes may also affect this patient's  functional outcome.      REHAB POTENTIAL: Good   CLINICAL DECISION MAKING: Stable/uncomplicated   EVALUATION COMPLEXITY: Low     GOALS: Goals reviewed with patient? Yes   SHORT TERM GOALS: Target date: 03/01/2022   Improve R shoulder AROM for flexion to 150 degrees, IR to 50 degrees and horizontal adduction to 30 degrees. Baseline: See objective Goal status: MET 03/01/22     LONG TERM GOALS: Target date: 03/29/2022   Improve FOTO to 69 in 11 visits. Baseline: 50 Goal status: ongoing   2.  Improve Rt shoulder pain to consistently 0-1/10 on the VAS. Baseline: 0-2/10 Goal status: ongoing   3.  Improve Rt shoulder AROM for flexion to 170 degrees; IR  to 60 degrees and horizontal adduction to 40 degrees. Baseline: See objective Goal status: ongoing   4.  Improve Rt shoulder strength to 90% or better of the uninvolved L. Baseline: 74% Goal status: ongoing   5.  Joseph Sharp will be independent with his long-term HEP at DC. Baseline: Started 02/15/2022 Goal status: ongoing   PLAN: PT FREQUENCY: 1-2x/week   PT DURATION: 6 weeks   PLANNED INTERVENTIONS: Therapeutic exercises, Therapeutic activity, Neuromuscular re-education, Patient/Family education, Joint mobilization, Cryotherapy, and Manual therapy   PLAN FOR NEXT SESSION: FOTO and update objective data.    Scot Jun, PT, DPT, OCS, ATC 03/10/22  9:14 AM  Farley Ly PT, MPT

## 2022-03-13 ENCOUNTER — Encounter: Payer: Medicare Other | Admitting: Rehabilitative and Restorative Service Providers"

## 2022-03-23 ENCOUNTER — Encounter: Payer: Medicare Other | Admitting: Rehabilitative and Restorative Service Providers"

## 2022-03-24 ENCOUNTER — Other Ambulatory Visit: Payer: Self-pay | Admitting: Family Medicine

## 2022-03-24 DIAGNOSIS — G8929 Other chronic pain: Secondary | ICD-10-CM

## 2022-03-24 DIAGNOSIS — M7581 Other shoulder lesions, right shoulder: Secondary | ICD-10-CM

## 2022-03-24 NOTE — Telephone Encounter (Signed)
Patient is requesting a refill of the following medications: ?Requested Prescriptions  ? ?Pending Prescriptions Disp Refills  ? meloxicam (MOBIC) 7.5 MG tablet [Pharmacy Med Name: MELOXICAM 7.5 MG TABLET] 30 tablet 0  ?  Sig: TAKE 1 TABLET BY MOUTH EVERY DAY  ? ? ?Date of patient request: 03/24/2022 ?Last office visit: 02/13/2022 ?Date of last refill: 02/13/2022 ?Last refill amount: 30 tablets  ?Follow up time period per chart: 03/30/2022 ? ?

## 2022-03-24 NOTE — Telephone Encounter (Signed)
Follow-up appointment planned next week.  Meloxicam refilled for now. ?

## 2022-03-28 ENCOUNTER — Encounter: Payer: Medicare Other | Admitting: Rehabilitative and Restorative Service Providers"

## 2022-03-30 ENCOUNTER — Encounter: Payer: Self-pay | Admitting: Family Medicine

## 2022-03-30 ENCOUNTER — Ambulatory Visit (INDEPENDENT_AMBULATORY_CARE_PROVIDER_SITE_OTHER): Payer: Medicare Other | Admitting: Family Medicine

## 2022-03-30 VITALS — BP 136/78 | HR 84 | Temp 97.9°F | Resp 16 | Ht 69.0 in | Wt 172.4 lb

## 2022-03-30 DIAGNOSIS — G8929 Other chronic pain: Secondary | ICD-10-CM

## 2022-03-30 DIAGNOSIS — N3943 Post-void dribbling: Secondary | ICD-10-CM

## 2022-03-30 DIAGNOSIS — M7581 Other shoulder lesions, right shoulder: Secondary | ICD-10-CM

## 2022-03-30 DIAGNOSIS — R3915 Urgency of urination: Secondary | ICD-10-CM

## 2022-03-30 DIAGNOSIS — M25511 Pain in right shoulder: Secondary | ICD-10-CM | POA: Diagnosis not present

## 2022-03-30 NOTE — Patient Instructions (Addendum)
If still having soreness in shoulder after restarting home exercises, follow up with physical therapy.  If urinary symptoms more persistent, I would recommend trying a new med or meeting with urology - let me know.  Follow up with endocrine as planned. I do recommend starting pravastatin prescribed by Dr. Dwyane Dee.

## 2022-03-30 NOTE — Progress Notes (Signed)
Subjective:  Patient ID: Joseph Sharp, male    DOB: 12-08-1950  Age: 71 y.o. MRN: [id number]  CC:  Chief Complaint  Patient presents with   Shoulder Pain    Pt reports he is doing okay, notes he has recently had what he believes to be a pinched nerve that has prevented him from doing stretches last week and has increased shoulder discomfort due to this.     HPI Joseph Sharp presents for   Shoulder pain: Follow-up from April 3 visit.  Initially evaluated in March.  Some chronic pain but flare since July of last year.  Rotator cuff tendinosis.  Treated initially with Mobic, imaging with mild acromioclavicular and glenohumeral osteoarthritis.  Improved at last visit, continue Mobic and referred to physical therapy. In PT since last visit.  Last treatment 03/10/22. Was improving until last 1.5 weeks. Pinched nerve in R hip past week, better. Treated by chiropractor. Had to stop HEP for shoulder temporarily. No planned PT follow up date.  Still some soreness. No limitations.  No pain meds for shoulder. Not needing   Urinary dribbling/urgency.  Discussed 4/3. No daytime symptoms. No changes. Some hesitancy, slow stream at night only. Nocturia once per night. PSA normal.  Declines meds at this time.  Lab Results  Component Value Date   PSA1 0.5 08/28/2018   PSA 0.34 02/13/2022   DM followed by endocrine. Next appt in August.  Lab Results  Component Value Date   HGBA1C 6.7 (H) 01/03/2022    Pravastatin '20mg'$  ordered in February by endocrine - Dr. Dwyane Dee - did not take as having shoulder pain. Did not tolerate lovastatin.   Lab Results  Component Value Date   CHOL 144 08/25/2021   HDL 36.40 (L) 08/25/2021   LDLCALC 86 08/25/2021   LDLDIRECT 95.0 04/18/2019   TRIG 107.0 08/25/2021   CHOLHDL 4 08/25/2021   Lab Results  Component Value Date   ALT 15 08/25/2021   AST 17 08/25/2021   ALKPHOS 56 08/25/2021   BILITOT 0.4 08/25/2021        History Patient Active  Problem List   Diagnosis Date Noted   ED (erectile dysfunction) of organic origin 09/04/2014   Allergic rhinitis 08/10/2014   Diabetes mellitus, stable ([place]) 06/17/2013   Pure hypercholesterolemia 06/17/2013   Past Medical History:  Diagnosis Date   Allergy    Diabetes mellitus without complication [facility])    Past Surgical History:  Procedure Laterality Date   EYE SURGERY  01/2013   Eyelid surgery   No Known Allergies Prior to Admission medications   Medication Sig Start Date End Date Taking? Authorizing Provider  Cyanocobalamin (B-12 PO) Take by mouth.   Yes [provider]  fluticasone (FLONASE) 50 MCG/ACT nasal spray Place 2 sprays into both nostrils daily. 10/13/21  Yes Carvel Getting, NP  glimepiride (AMARYL) 4 MG tablet TAKE 1 TABLET BY MOUTH AT BEDTIME. Patient taking differently: Take 4 mg by mouth. Take 1/2 tablet at bedtime 09/27/21  Yes Elayne Snare, MD  glucose blood test strip 1 each by Other route as needed for other. Use as instructed to check blood sugar once daily. 12/19/18  Yes Elayne Snare, MD  metFORMIN (GLUCOPHAGE-XR) 500 MG 24 hr tablet TAKE 4 TABLETS BY MOUTH EVERY DAY 07/26/21  Yes Elayne Snare, MD  OZEMPIC, 1 MG/DOSE, 4 MG/3ML SOPN INJECT '1MG'$  INTO THE SKIN ONCE A WEEK 12/06/21  Yes Elayne Snare, MD  pioglitazone (ACTOS) 30 MG tablet TAKE 1 TABLET  BY MOUTH EVERY DAY 10/26/21  Yes Elayne Snare, MD  zinc gluconate 50 MG tablet Take by mouth.   Yes [provider]  meloxicam (MOBIC) 7.5 MG tablet TAKE 1 TABLET BY MOUTH EVERY DAY Patient not taking: Reported on 03/30/2022 03/24/22   [name], MD  pravastatin (PRAVACHOL) 20 MG tablet Take 1 tablet (20 mg total) by mouth daily. Patient not taking: Reported on 02/13/2022 01/05/22   Elayne Snare, MD   Social History   Socioeconomic History   Marital status: Married    Spouse name: Joseph Sharp   Number of children: 1   Years of education: Not on file   Highest education level: Not on file  Occupational  History   Occupation: Engineering    Comment: [employer]  Tobacco Use   Smoking status: Never   Smokeless tobacco: Never  Substance and Sexual Activity   Alcohol use: No   Drug use: No   Sexual activity: Yes  Other Topics Concern   Not on file  Social History Narrative   Lives with his wife.  Their daughter lives in [place], [place].   Social Determinants of Health   Financial Resource Strain: Low Risk    Difficulty of Paying Living Expenses: Not hard at all  Food Insecurity: No Food Insecurity   Worried About Charity fundraiser in the Last Year: Never true   [place] in the Last Year: Never true  Transportation Needs: No Transportation Needs   Lack of Transportation (Medical): No   Lack of Transportation (Non-Medical): No  Physical Activity: Sufficiently Active   Days of Exercise per Week: 5 days   Minutes of Exercise per Session: 30 min  Stress: No Stress Concern Present   Feeling of Stress : Not at all  Social Connections: Socially Integrated   Frequency of Communication with Friends and Family: More than three times a week   Frequency of Social Gatherings with Friends and Family: More than three times a week   Attends Religious Services: More than 4 times per year   Active Member of [employer] or Organizations: Yes   Attends Music therapist: More than 4 times per year   Marital Status: Married  Human resources officer Violence: Not At Risk   Fear of Current or Ex-Partner: No   Emotionally Abused: No   Physically Abused: No   Sexually Abused: No    Review of Systems Per HPI.   Objective:   Vitals:   03/30/22 0916  BP: 136/78  Pulse: 84  Resp: 16  Temp: 97.9 F (36.6 C)  TempSrc: Temporal  SpO2: 97%  Weight: 172 lb 6.4 oz (78.2 kg)  Height: '5\' 9"'$  (1.753 m)     Physical Exam Constitutional:      General: He is not in acute distress.    Appearance: Normal appearance. He is well-developed.  HENT:     Head: Normocephalic and  atraumatic.  Cardiovascular:     Rate and Rhythm: Normal rate.  Pulmonary:     Effort: Pulmonary effort is normal.  Abdominal:     General: There is no distension.     Tenderness: There is no abdominal tenderness. There is no right CVA tenderness or left CVA tenderness.  Musculoskeletal:     Comments: Right shoulder Near full range of motion with only slight limitation in internal rotation by approximately 2-3 vertebral levels on the right versus left. Minimal discomfort with resisted empty can, but no weakness, negative drop arm.  Other rotator cuff testing pain-free and full/equal strength.  Minimal discomfort with Neer, negative Hawkins.  Neurovascular intact distally.  Neurological:     Mental Status: He is alert and oriented to person, place, and time.  Psychiatric:        Mood and Affect: Mood normal.       Assessment & Plan:  Joseph Sharp is a 71 y.o. male . Chronic right shoulder pain Right rotator cuff tendinitis  -Stable, some improvement with PT.  Recently had to discontinue his home exercise program due to some back issues which are now improving, followed by chiropractor.  RTC precautions given to discuss/eval that separately if persistent or worsening symptoms.  Recommended restart HEP, then follow-up with PT if not significantly improved.  RTC precautions if persistent symptoms as may need Ortho eval.  Urinary dribbling Urgency of urination  -Possible BPH, minimal symptoms and only at night.  Option of low-dose alpha-blocker, declined at this time.  RTC precautions given.  Recheck in 2 months for physical.  Recommended he start statin as ordered by endocrinology.  No orders of the defined types were placed in this encounter.  Patient Instructions  If still having soreness in shoulder after restarting home exercises, follow up with physical therapy.  If urinary symptoms more persistent, I would recommend trying a new med or meeting with urology - let me  know.  Follow up with endocrine as planned. I do recommend starting pravastatin prescribed by Dr. Dwyane Dee.        Signed,   Merri Ray, MD [place], [place] [facility] 03/30/22 10:01 AM

## 2022-04-11 ENCOUNTER — Other Ambulatory Visit: Payer: Self-pay | Admitting: Endocrinology

## 2022-05-11 LAB — HM DIABETES EYE EXAM

## 2022-05-23 ENCOUNTER — Other Ambulatory Visit: Payer: Self-pay | Admitting: Endocrinology

## 2022-06-07 ENCOUNTER — Ambulatory Visit (INDEPENDENT_AMBULATORY_CARE_PROVIDER_SITE_OTHER): Payer: Medicare Other | Admitting: Family Medicine

## 2022-06-07 ENCOUNTER — Encounter: Payer: Self-pay | Admitting: Family Medicine

## 2022-06-07 VITALS — BP 116/70 | HR 74 | Temp 98.0°F | Resp 16 | Ht 68.5 in | Wt 166.8 lb

## 2022-06-07 DIAGNOSIS — Z0000 Encounter for general adult medical examination without abnormal findings: Secondary | ICD-10-CM | POA: Diagnosis not present

## 2022-06-07 DIAGNOSIS — M25511 Pain in right shoulder: Secondary | ICD-10-CM | POA: Diagnosis not present

## 2022-06-07 DIAGNOSIS — G8929 Other chronic pain: Secondary | ICD-10-CM

## 2022-06-07 NOTE — Progress Notes (Signed)
Subjective:  Patient ID: Joseph Sharp, male    DOB: August 22, 1951  Age: 71 y.o. MRN: [id number]  CC:  Chief Complaint  Patient presents with   Annual Exam    Pt notes he is fasting, stopped glimipride due to getting out of the habit     HPI Joseph Sharp presents for Annual Exam  Care team: PCP, me Endocrinology, Dr. Dwyane Dee  Diabetes: Followed by endocrinology.  Last appointment in February.  Treated with metformin 2 g, pioglitazone 30 mg, Ozempic 0.5 mg weekly, glimepiride 4 mg at night.  Stable on most recent A1c in February.  Appointment with Dr. Dwyane Dee on August 24.  Has been off his glimepiride several months - out of routine.  No home readings: No change in thirst, urination, or blurry vision.  Statins have been discussed previously.  Pravastatin ordered by endocrinology in February, not taken due to having shoulder pain.  Did not tolerate lovastatin. Underwent physical therapy for shoulder with some improvement.  Also treated by chiropractor for back pain when discussed in May.  Plan for restart home exercise program for shoulder symptoms with option of Ortho eval. Still sore, some exercises at home, hurts to lift in front - declines ortho eval at this time.  Did not take pravastatin.   Diabetic Foot Exam - Simple   No data filed     Lab Results  Component Value Date   HGBA1C 6.7 (H) 01/03/2022   HGBA1C 6.6 (H) 08/25/2021   HGBA1C 7.1 (H) 05/26/2021   Lab Results  Component Value Date   MICROALBUR 0.8 01/03/2022   [place] 86 08/25/2021   CREATININE 1.06 01/03/2022        06/07/2022    9:19 AM 03/30/2022    9:19 AM 02/28/2022    2:57 PM 01/23/2022    1:44 PM 03/11/2020    4:24 PM  Depression screen PHQ 2/9  Decreased Interest 0 0 0 0 0  Down, Depressed, Hopeless 0 0 0 0 0  PHQ - 2 Score 0 0 0 0 0  Altered sleeping 1 0  0   Tired, decreased energy '1 1  1   '$ Change in appetite 0 0  1   Feeling bad or failure about yourself  0 0  0   Trouble  concentrating 0 0  0   Moving slowly or fidgety/restless 0 0  0   Suicidal thoughts 0 0  0   PHQ-9 Score '2 1  2   '$ Difficult doing work/chores    Not difficult at all   Denies depression, occasional insomnia - wakes up early at times. 5 hrs sleep per night at times. Naps during day. No snoring, no hx of OSA.   Health Maintenance  Topic Date Due   FOOT EXAM  05/30/2022   COVID-19 Vaccine (4 - Pfizer series) 06/23/2022 (Originally 12/22/2021)   Zoster Vaccines- Shingrix (1 of 2) 09/07/2022 (Originally 01/28/2003)   TETANUS/TDAP  02/14/2023 (Originally 01/28/1972)   INFLUENZA VACCINE  06/13/2022   HEMOGLOBIN A1C  07/03/2022   URINE MICROALBUMIN  01/03/2023   OPHTHALMOLOGY EXAM  05/12/2023   COLONOSCOPY (Pts 45-45yr Insurance coverage will need to be confirmed)  10/20/2030   Pneumonia Vaccine 71 Years old  Completed   Hepatitis C Screening  Completed   HPV VACCINES  Aged Out  Colonoscopy 10/20/2020, repeat 10 years Prostate: does not have family history of prostate cancer, PSA normal in April.  Discussed urinary dribbling/urgency at that time, minimal symptoms, declined meds.  No new symptoms. Improved past month.  Lab Results  Component Value Date   PSA1 0.5 08/28/2018   PSA 0.34 02/13/2022    Immunization History  Administered Date(s) Administered   Influenza, High Dose Seasonal PF 08/28/2018, 08/26/2019, 08/21/2021   Influenza,inj,Quad PF,6+ Mos 09/04/2014, 08/18/2015, 08/08/2016, 10/12/2017   PFIZER(Purple Top)SARS-COV-2 Vaccination 12/03/2019, 12/24/2019   Pfizer Covid-19 Vaccine Bivalent Booster 6yr & up 08/21/2021   Pneumococcal Conjugate-13 04/26/2016   Pneumococcal Polysaccharide-23 08/28/2018   Zoster, Live 09/04/2014  COVID bivalent booster in October 2022, repeat booster recommended. Plans with flu shot in next few months.  Shingrix - recommended at pharmacy.    No results found. Optho - Dr. [facility]. Yearly.   Dental:Yes and Within Last 6  months  Alcohol:none  Tobacco: none  Exercise: less past few months with some joint issues. Back better with chiropracter. Still active with gardening.   History Patient Active Problem List   Diagnosis Date Noted   ED (erectile dysfunction) of organic origin 09/04/2014   Allergic rhinitis 08/10/2014   Diabetes mellitus, stable ([place] 06/17/2013   Pure hypercholesterolemia 06/17/2013   Past Medical History:  Diagnosis Date   Allergy    Diabetes mellitus without complication ([facility]    Past Surgical History:  Procedure Laterality Date   EYE SURGERY  01/2013   Eyelid surgery   No Known Allergies Prior to Admission medications   Medication Sig Start Date End Date Taking? Authorizing Provider  Cyanocobalamin (B-12 PO) Take by mouth.   Yes [provider]  fluticasone (FLONASE) 50 MCG/ACT nasal spray Place 2 sprays into both nostrils daily. 10/13/21  Yes KCarvel Getting NP  metFORMIN ++++(GLUCOPHAGE-XR) 500 MG 24 hr tablet TAKE 4 TABLETS BY MOUTH EVERY DAY 04/12/22  Yes KElayne Snare MD  OZEMPIC, 1 MG/DOSE, 4 MG/3ML SOPN INJECT '1MG'$  INTO THE SKIN ONCE A WEEK 05/24/22  Yes KElayne Snare MD  pioglitazone (ACTOS) 30 MG tablet TAKE 1 TABLET BY MOUTH EVERY DAY 04/12/22  Yes KElayne Snare MD  zinc gluconate 50 MG tablet Take by mouth.   Yes [provider]  glimepiride (AMARYL) 4 MG tablet TAKE 1 TABLET BY MOUTH AT BEDTIME. Patient not taking: Reported on 06/07/2022 09/27/21   KElayne Snare MD  glucose blood test strip 1 each by Other route as needed for other. Use as instructed to check blood sugar once daily. Patient not taking: Reported on 06/07/2022 12/19/18   KElayne Snare MD  meloxicam (MOBIC) 7.5 MG tablet TAKE 1 TABLET BY MOUTH EVERY DAY Patient not taking: Reported on 03/30/2022 03/24/22   GWendie Agreste MD  pravastatin (PRAVACHOL) 20 MG tablet Take 1 tablet (20 mg total) by mouth daily. Patient not taking: Reported on 02/13/2022 01/05/22   KElayne Snare MD   Social History    Socioeconomic History   Marital status: Married    Spouse name: LLattie Haw  Number of children: 1   Years of education: Not on file   Highest education level: Not on file  Occupational History   Occupation: Engineering    Comment: [employer]  Tobacco Use   Smoking status: Never   Smokeless tobacco: Never  Substance and Sexual Activity   Alcohol use: No   Drug use: No   Sexual activity: Yes  Other Topics Concern   Not on file  Social History Narrative   Lives with his wife.  [place] in [place]   Social Determinants of Health   Financial Resource Strain: Low  Risk  (02/28/2022)   Overall Financial Resource Strain (CARDIA)    Difficulty of Paying Living Expenses: Not hard at all  Food Insecurity: No Food Insecurity (02/28/2022)   Hunger Vital Sign    Worried About Running Out of Food in the Last Year: Never true    Ran Out of Food in the Last Year: Never true  Transportation Needs: No Transportation Needs (02/28/2022)   PRAPARE - Hydrologist (Medical): No    Lack of Transportation (Non-Medical): No  Physical Activity: Sufficiently Active (02/28/2022)   Exercise Vital Sign    Days of Exercise per Week: 5 days    Minutes of Exercise per Session: 30 min  Stress: No Stress Concern Present (02/28/2022)   [place]    Feeling of Stress : Not at all  Social Connections: [place] (02/28/2022)   Social Connection and Isolation Panel [NHANES]    Frequency of Communication with Friends and Family: More than three times a week    Frequency of Social Gatherings with Friends and Family: More than three times a week    Attends Religious Services: More than 4 times per year    Active Member of [employer] or Organizations: Yes    Attends Archivist Meetings: More than 4 times per year    Marital Status: Married  Human resources officer Violence: Not At Risk  (02/28/2022)   Humiliation, Afraid, Rape, and Kick questionnaire    Fear of Current or Ex-Partner: No    Emotionally Abused: No    Physically Abused: No    Sexually Abused: No    Review of Systems   Objective:   Vitals:   06/07/22 0916  BP: 116/70  Pulse: 74  Resp: 16  Temp: 98 F (36.7 C)  TempSrc: Oral  SpO2: 97%  Weight: 166 lb 12.8 oz (75.7 kg)  Height: 5' 8.5" (1.74 m)     Physical Exam Vitals reviewed.  Constitutional:      Appearance: He is well-developed.  HENT:     Head: Normocephalic and atraumatic.     Right Ear: External ear normal.     Left Ear: External ear normal.  Eyes:     Conjunctiva/sclera: Conjunctivae normal.     Pupils: Pupils are equal, round, and reactive to light.  Neck:     Thyroid: No thyromegaly.  Cardiovascular:     Rate and Rhythm: Normal rate and regular rhythm.     Heart sounds: Normal heart sounds.  Pulmonary:     Effort: Pulmonary effort is normal. No respiratory distress.     Breath sounds: Normal breath sounds. No wheezing.  Abdominal:     General: There is no distension.     Palpations: Abdomen is soft.     Tenderness: There is no abdominal tenderness.  Musculoskeletal:        General: No tenderness. Normal range of motion.     Cervical back: Normal range of motion and neck supple.     Comments: Right shoulder, no focal bony tenderness.  Full range of motion.  Discomfort with Neer's, flexion at 90 to approximately 120 but negative [place].  Negative empty can, no apparent weakness with RTC testing.  Lymphadenopathy:     Cervical: No cervical adenopathy.  Skin:    General: Skin is warm and dry.  Neurological:     Mental Status: He is alert and oriented to person, place, and time.     Deep  Tendon Reflexes: Reflexes are normal and symmetric.  Psychiatric:        Behavior: Behavior normal.        Assessment & Plan:  Joseph Sharp is a 71 y.o. male . Annual physical exam - Plan: CANCELED: Comprehensive  metabolic panel, CANCELED: Lipid panel  - -anticipatory guidance as below in AVS, screening labs ordered with his endocrinologist, will avoid labs today.Marland Kitchen Health maintenance items as above in HPI discussed/recommended as applicable.   -Rationale for use of statin with diabetes discussed.  Has upcoming lab work with endocrinologist.  Intermittent statin as an option.  -Recommended follow-up if specific joint pain or limiting his exercise.  Chronic right shoulder pain - Plan: Ambulatory referral to Orthopedic Surgery  -Possible component of impingement, chronic pain.  Refer to Ortho.  No orders of the defined types were placed in this encounter.  Patient Instructions  I would still recommend taking pravastatin, even few days per week may be helpful.  Keep follow up with Dr. Dwyane Dee as planned.  It appears he has ordered the tests that I would typically check for your physical so no blood work today. Try to avoid daytime naps to see if longer sleep at night. Return if any of your symptoms worsen or new symptoms occur. Shingles vaccine at your pharmacy I will refer you to orthopedics for the shoulder pain.  Please follow-up if other joint pains limiting exercise.  Thanks for coming in today and let me know if there are questions.     Signed,   Merri Ray, MD [place] 06/07/22 10:13 AM

## 2022-06-07 NOTE — Patient Instructions (Addendum)
I would still recommend taking pravastatin, even few days per week may be helpful.  Keep follow up with Dr. Dwyane Dee as planned.  It appears he has ordered the tests that I would typically check for your physical so no blood work today. Try to avoid daytime naps to see if longer sleep at night. Return if any of your symptoms worsen or new symptoms occur. Shingles vaccine at your pharmacy I will refer you to orthopedics for the shoulder pain.  Please follow-up if other joint pains limiting exercise.  Thanks for coming in today and let me know if there are questions.

## 2022-06-15 ENCOUNTER — Ambulatory Visit (INDEPENDENT_AMBULATORY_CARE_PROVIDER_SITE_OTHER): Payer: Medicare Other | Admitting: Orthopaedic Surgery

## 2022-06-15 DIAGNOSIS — M25511 Pain in right shoulder: Secondary | ICD-10-CM | POA: Diagnosis not present

## 2022-06-15 DIAGNOSIS — G8929 Other chronic pain: Secondary | ICD-10-CM | POA: Diagnosis not present

## 2022-06-15 MED ORDER — METHYLPREDNISOLONE ACETATE 40 MG/ML IJ SUSP
40.0000 mg | INTRAMUSCULAR | Status: AC | PRN
  Administered 2022-06-15: 40 mg via INTRA_ARTICULAR

## 2022-06-15 MED ORDER — LIDOCAINE HCL 1 % IJ SOLN
3.0000 mL | INTRAMUSCULAR | Status: AC | PRN
  Administered 2022-06-15: 3 mL

## 2022-06-15 MED ORDER — BUPIVACAINE HCL 0.5 % IJ SOLN
3.0000 mL | INTRAMUSCULAR | Status: AC | PRN
  Administered 2022-06-15: 3 mL via INTRA_ARTICULAR

## 2022-06-15 NOTE — Progress Notes (Signed)
Office Visit Note   Patient: Joseph Sharp           Date of Birth: Sep 11, 1951           MRN: [id number] Visit Date: 06/15/2022              Requested by: Joseph Agreste, MD [address] [place],  [place] [address] PCP: Joseph Agreste, MD   Assessment & Plan: Visit Diagnoses:  1. Chronic right shoulder pain     Plan: Impression is chronic right shoulder pain etiology likely rotator cuff tendinitis and possibly AC joint arthritis.  Treatment options were reviewed and patient elected for subacromial injection today.  Will give this 6 weeks combined with relative rest.  If symptoms persist will need MRI.  Follow-Up Instructions: No follow-ups on file.   Orders:  No orders of the defined types were placed in this encounter.  No orders of the defined types were placed in this encounter.     Procedures: Large Joint Inj: R subacromial bursa on 06/15/2022 9:38 AM Indications: pain Details: 22 G needle  Arthrogram: No  Medications: 3 mL lidocaine 1 %; 3 mL bupivacaine 0.5 %; 40 mg methylPREDNISolone acetate 40 MG/ML Outcome: tolerated well, no immediate complications Consent was given by the patient. Patient was prepped and draped in the usual sterile fashion.       Clinical Data: No additional findings.   Subjective: Chief Complaint  Patient presents with   Right Shoulder - Pain    HPI Joseph Sharp is a very pleasant 71 year old gentleman here for chronic right shoulder pain.  I originally saw primary care doctor who sent him to physical therapy which she has been doing since the spring.  Has not felt much improvement.  He is fairly active and has pain when using his hands above his head.  Denies radicular symptoms.  Pain in the posterior lateral region of the shoulder which radiates down the arm.  No pain at rest. Review of Systems  Constitutional: Negative.   All other systems reviewed and are negative.    Objective: Vital Signs: There were no vitals  taken for this visit.  Physical Exam Vitals and nursing note reviewed.  Constitutional:      Appearance: He is well-developed.  HENT:     Head: Normocephalic and atraumatic.  Eyes:     Pupils: Pupils are equal, round, and reactive to light.  Pulmonary:     Effort: Pulmonary effort is normal.  Abdominal:     Palpations: Abdomen is soft.  Musculoskeletal:        General: Normal range of motion.     Cervical back: Neck supple.  Skin:    General: Skin is warm.  Neurological:     Mental Status: He is alert and oriented to person, place, and time.  Psychiatric:        Behavior: Behavior normal.        Thought Content: Thought content normal.        Judgment: Judgment normal.     Ortho Exam Examination of right shoulder girdle shows no masses lesions swelling.  Normal passive and active range of motion.  Pain with empty can test.  No pain with impingement test.  There is crepitus with glenohumeral motion.  AC joint is slightly tender to palpation. Specialty Comments:  No specialty comments available.  Imaging: No results found.   PMFS History: Patient Active Problem List   Diagnosis Date Noted   ED (  erectile dysfunction) of organic origin 09/04/2014   Allergic rhinitis 08/10/2014   Diabetes mellitus, stable ([place]) 06/17/2013   Pure hypercholesterolemia 06/17/2013   Past Medical History:  Diagnosis Date   Allergy    Diabetes mellitus without complication ([place])     Family History  Problem Relation Age of Onset   Diabetes Mother    Arthritis Mother    Diabetes Brother    Cancer Neg Hx    Heart disease Neg Hx     Past Surgical History:  Procedure Laterality Date   EYE SURGERY  01/2013   Eyelid surgery   Social History   Occupational History   Occupation: Engineer, production    Comment: [employer]  Tobacco Use   Smoking status: Never   Smokeless tobacco: Never  Substance and Sexual Activity   Alcohol use: No   Drug use: No   Sexual activity: Yes

## 2022-07-04 ENCOUNTER — Other Ambulatory Visit (INDEPENDENT_AMBULATORY_CARE_PROVIDER_SITE_OTHER): Payer: Medicare Other

## 2022-07-04 DIAGNOSIS — E119 Type 2 diabetes mellitus without complications: Secondary | ICD-10-CM | POA: Diagnosis not present

## 2022-07-04 DIAGNOSIS — E785 Hyperlipidemia, unspecified: Secondary | ICD-10-CM | POA: Diagnosis not present

## 2022-07-04 LAB — COMPREHENSIVE METABOLIC PANEL
ALT: 15 U/L (ref 0–53)
AST: 17 U/L (ref 0–37)
Albumin: 4.2 g/dL (ref 3.5–5.2)
Alkaline Phosphatase: 54 U/L (ref 39–117)
BUN: 25 mg/dL — ABNORMAL HIGH (ref 6–23)
CO2: 25 mEq/L (ref 19–32)
Calcium: 9.7 mg/dL (ref 8.4–10.5)
Chloride: 107 mEq/L (ref 96–112)
Creatinine, Ser: 1.11 mg/dL (ref 0.40–1.50)
GFR: 67.79 mL/min (ref >60.00)
Glucose, Bld: 100 mg/dL — ABNORMAL HIGH (ref 70–99)
Potassium: 4.4 mEq/L (ref 3.5–5.1)
Sodium: 142 mEq/L (ref 135–145)
Total Bilirubin: 0.5 mg/dL (ref 0.2–1.2)
Total Protein: 6.5 g/dL (ref 6.0–8.3)

## 2022-07-04 LAB — LIPID PANEL
Cholesterol: 119 mg/dL (ref 0–200)
HDL: 40.7 mg/dL (ref >39.00)
LDL Cholesterol: 59 mg/dL (ref 0–99)
NonHDL: 78.09
Total CHOL/HDL Ratio: 3
Triglycerides: 94 mg/dL (ref 0.0–149.0)
VLDL: 18.8 mg/dL (ref 0.0–40.0)

## 2022-07-04 LAB — HEMOGLOBIN A1C: Hgb A1c MFr Bld: 6.9 % — ABNORMAL HIGH (ref 4.6–6.5)

## 2022-07-06 ENCOUNTER — Ambulatory Visit (INDEPENDENT_AMBULATORY_CARE_PROVIDER_SITE_OTHER): Payer: Medicare Other | Admitting: Endocrinology

## 2022-07-06 ENCOUNTER — Encounter: Payer: Self-pay | Admitting: Endocrinology

## 2022-07-06 VITALS — BP 108/68 | HR 71 | Ht 69.0 in | Wt 167.0 lb

## 2022-07-06 DIAGNOSIS — E1165 Type 2 diabetes mellitus with hyperglycemia: Secondary | ICD-10-CM

## 2022-07-06 DIAGNOSIS — E785 Hyperlipidemia, unspecified: Secondary | ICD-10-CM

## 2022-07-06 NOTE — Progress Notes (Signed)
Patient ID: Joseph Sharp, male   DOB: [birth year], 71 y.o.   MRN: [id number]   Reason for Appointment: follow-up for diabetes and blood pressure   History of Present Illness    Diagnosis: Type 2 DIABETES MELITUS, diagnosed in 1977     PAST history: He has had long-standing diabetes managed with multiple drugs and usually well controlled His blood sugar control is somewhat dependent on his compliance with diet and exercise regimen Amaryl was stopped in 2014 because of tendency to hypoglycemia and Prandin was started  In 11 /14 his blood sugars were higher overall and A1c was 7% which was higher than usual Because of this he was started on Trulicity injections, 2.11 mg He did benefit overall from starting 9.41 mg Trulicity  RECENT history:  His A1c is about the same at 6.9, previously was higher at 7.1 Has been as low as 6.4   Non-insulin hypoglycemic drugs: Amaryl 4 mg at night, metformin 2g, pioglitazone 30 mg, Ozempic 0.5 mg weekly  Current management, blood sugar patterns and problems identified: He has been checking his blood sugars as usual only a few days before his visit  He now is using a [employer] brand monitor as he lost his previous monitor and did not call us Usually will have slightly higher readings after breakfast but only has a few readings after dinner which are not consistently high Because of having musculoskeletal problems lately he has not been able to exercise much However he still has lost 5 pounds since May He thinks he is still planning his meals well No hypoglycemic symptoms He is taking the same dose of 0.5 mg Ozempic, dialing halfway on the 1 mg pen out of a whole month   Side effects from medications: None  Monitors blood glucose:  1+ times a day     Glucometer:          Blood Glucose readings by download of meter:   PRE-MEAL Fasting Lunch Dinner Bedtime Overall  Glucose range: 94-117 146 0.5    Mean/median:     136   POST-MEAL PC  Breakfast PC Lunch PC Dinner  Glucose range: 166, 172  137-172  Mean/median:      Previously:  PRE-MEAL Fasting Midday Dinner Bedtime Overall  Glucose range: 107-134 107-154 82-123    Mean/median: 123 129 117  16   POST-MEAL PC Breakfast PC Lunch PC Dinner  Glucose range:   ?  Mean/median:       Dinner usually at 7 pm  EXERCISE: With walking 3-4/7  days    Wt Readings from Last 3 Encounters:  07/06/22 167 lb (75.8 kg)  06/07/22 166 lb 12.8 oz (75.7 kg)  03/30/22 172 lb 6.4 oz (78.2 kg)   Lab Results  Component Value Date   HGBA1C 6.9 (H) 07/04/2022   HGBA1C 6.7 (H) 01/03/2022   HGBA1C 6.6 (H) 08/25/2021   Lab Results  Component Value Date   MICROALBUR 0.8 01/03/2022   LDLCALC 59 07/04/2022   CREATININE 1.11 07/04/2022     Allergies as of 07/06/2022   No Known Allergies      Medication List        Accurate as of July 06, 2022  8:59 AM. If you have any questions, ask your nurse or doctor.          B-12 PO Take by mouth.   fluticasone 50 MCG/ACT nasal spray Commonly known as: FLONASE Place 2 sprays into both nostrils daily.  glucose blood test strip 1 each by Other route as needed for other. Use as instructed to check blood sugar once daily.   metFORMIN 500 MG 24 hr tablet Commonly known as: GLUCOPHAGE-XR TAKE 4 TABLETS BY MOUTH EVERY DAY   Ozempic (1 MG/DOSE) 4 MG/3ML Sopn Generic drug: Semaglutide (1 MG/DOSE) INJECT '1MG'$  INTO THE SKIN ONCE A WEEK   pioglitazone 30 MG tablet Commonly known as: ACTOS TAKE 1 TABLET BY MOUTH EVERY DAY   pravastatin 20 MG tablet Commonly known as: PRAVACHOL Take 1 tablet (20 mg total) by mouth daily.   zinc gluconate 50 MG tablet Take by mouth.        Allergies: No Known Allergies  Past Medical History:  Diagnosis Date   Allergy    Diabetes mellitus without complication [facility])     Past Surgical History:  Procedure Laterality Date   EYE SURGERY  01/2013   Eyelid surgery    Family History   Problem Relation Age of Onset   Diabetes Mother    Arthritis Mother    Diabetes Brother    Cancer Neg Hx    Heart disease Neg Hx     Social History:  reports that he has never smoked. He has never used smokeless tobacco. He reports that he does not drink alcohol and does not use drugs.   Review of Systems    Blood pressure is consistently in the normal range without pharmacological therapy   BP Readings from Last 3 Encounters:  07/06/22 108/68  06/07/22 116/70  03/30/22 136/78   CREATININE level stable  Lab Results  Component Value Date   CREATININE 1.11 07/04/2022   CREATININE 1.06 01/03/2022   CREATININE 1.01 08/25/2021     HYPERLIPIDEMIA: The lipid abnormality consists of elevated LDL and triglycerides and low HDL LDL previously well controlled with lovastatin Has low HDL from metabolic syndrome and history of high triglycerides  He had stopped lovastatin in 2020 and again after a trial subsequently because of leg pain  His triglycerides have been consistently normal  He is not taking pravastatin for cardiovascular risk reduction  LDL consistently under 100   Lab Results  Component Value Date   CHOL 119 07/04/2022   CHOL 144 08/25/2021   CHOL 139 02/24/2021   Lab Results  Component Value Date   HDL 40.70 07/04/2022   HDL 36.40 (L) 08/25/2021   HDL 36.50 (L) 02/24/2021   Lab Results  Component Value Date   LDLCALC 59 07/04/2022   LDLCALC 86 08/25/2021   LDLCALC 79 02/24/2021   Lab Results  Component Value Date   TRIG 94.0 07/04/2022   TRIG 107.0 08/25/2021   TRIG 114.0 02/24/2021   Lab Results  Component Value Date   CHOLHDL 3 07/04/2022   CHOLHDL 4 08/25/2021   CHOLHDL 4 02/24/2021   Lab Results  Component Value Date   LDLDIRECT 95.0 04/18/2019     He takes vitamin D 1000 units OTC as recommended for supplementation with normal levels  Lab Results  Component Value Date   VD25OH 66.01 05/26/2021   VD25OH 54.76 03/29/2020           Examination:   BP 108/68   Pulse 71   Ht '5\' 9"'$  (1.753 m)   Wt 167 lb (75.8 kg)   SpO2 99%   BMI 24.66 kg/m   Body mass index is 24.66 kg/m.       ASSESSMENT/ PLAN:    DIABETES, non-insulin-dependent:   See history of present illness  for detailed discussion of current diabetes management, blood sugar patterns and problems identified  His A1c is fairly well controlled at 6.9  He is on multiple drugs including metformin, Ozempic, pioglitazone and glimepiride Blood sugars are somewhat variable with occasional high postprandial readings especially breakfast Diet is generally excellent Also has lost weight despite not exercising He was given a One Touch Verio monitor today to start checking blood sugars with the meter that can be downloaded and prescription sent He will continue same dose of Ozempic for now  Cardiovascular risk reduction: He has a better HDL and low LDL with pravastatin which he is tolerating now  He will continue the same regimen  Chemistry panel reviewed and is normal  There are no Patient Instructions on file for this visit.   Elayne Snare 07/06/2022, 8:59 AM   Note: This office note was prepared with Dragon voice recognition system technology. Any transcriptional errors that result from this process are unintentional.

## 2022-07-06 NOTE — Patient Instructions (Addendum)
Check blood sugars on waking up days a week  Also check blood sugars about 2 hours after meals and do this after different meals by rotation  Recommended blood sugar levels on waking up are 90-130 and about 2 hours after meal is 130-160  Please bring your blood sugar monitor to each visit, thank you  Walk daily

## 2022-07-16 IMAGING — DX DG SHOULDER 2+V*R*
3 series · 3 of 3 positions shown · non-contrast
Comparison: None.

CLINICAL DATA: Right shoulder pain for 8 months

EXAM:
RIGHT SHOULDER - 2+ VIEW

[grashey]
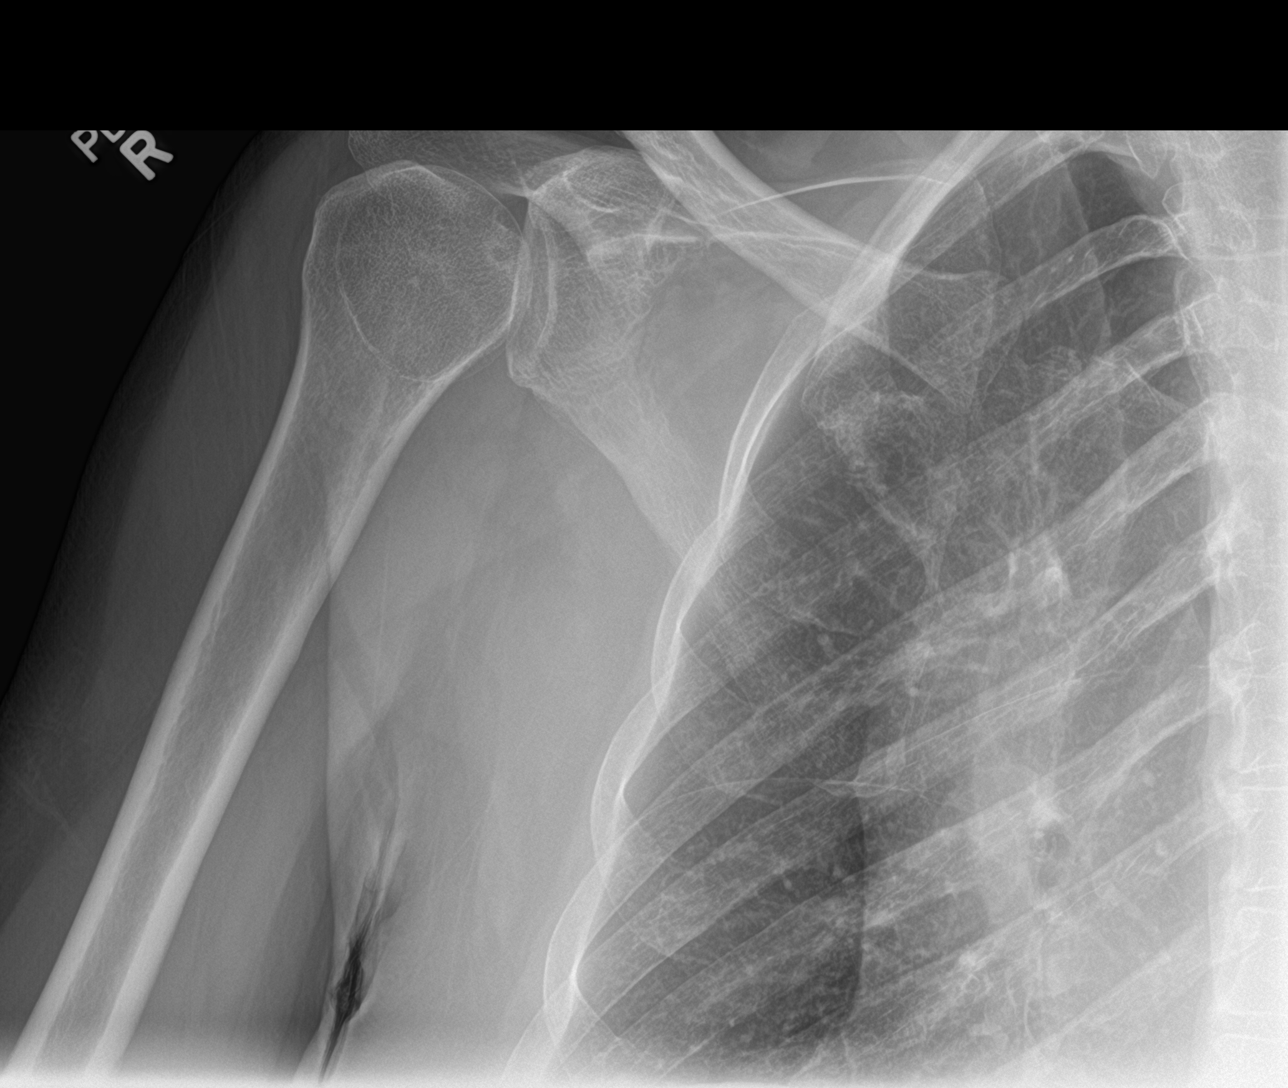

[y view]
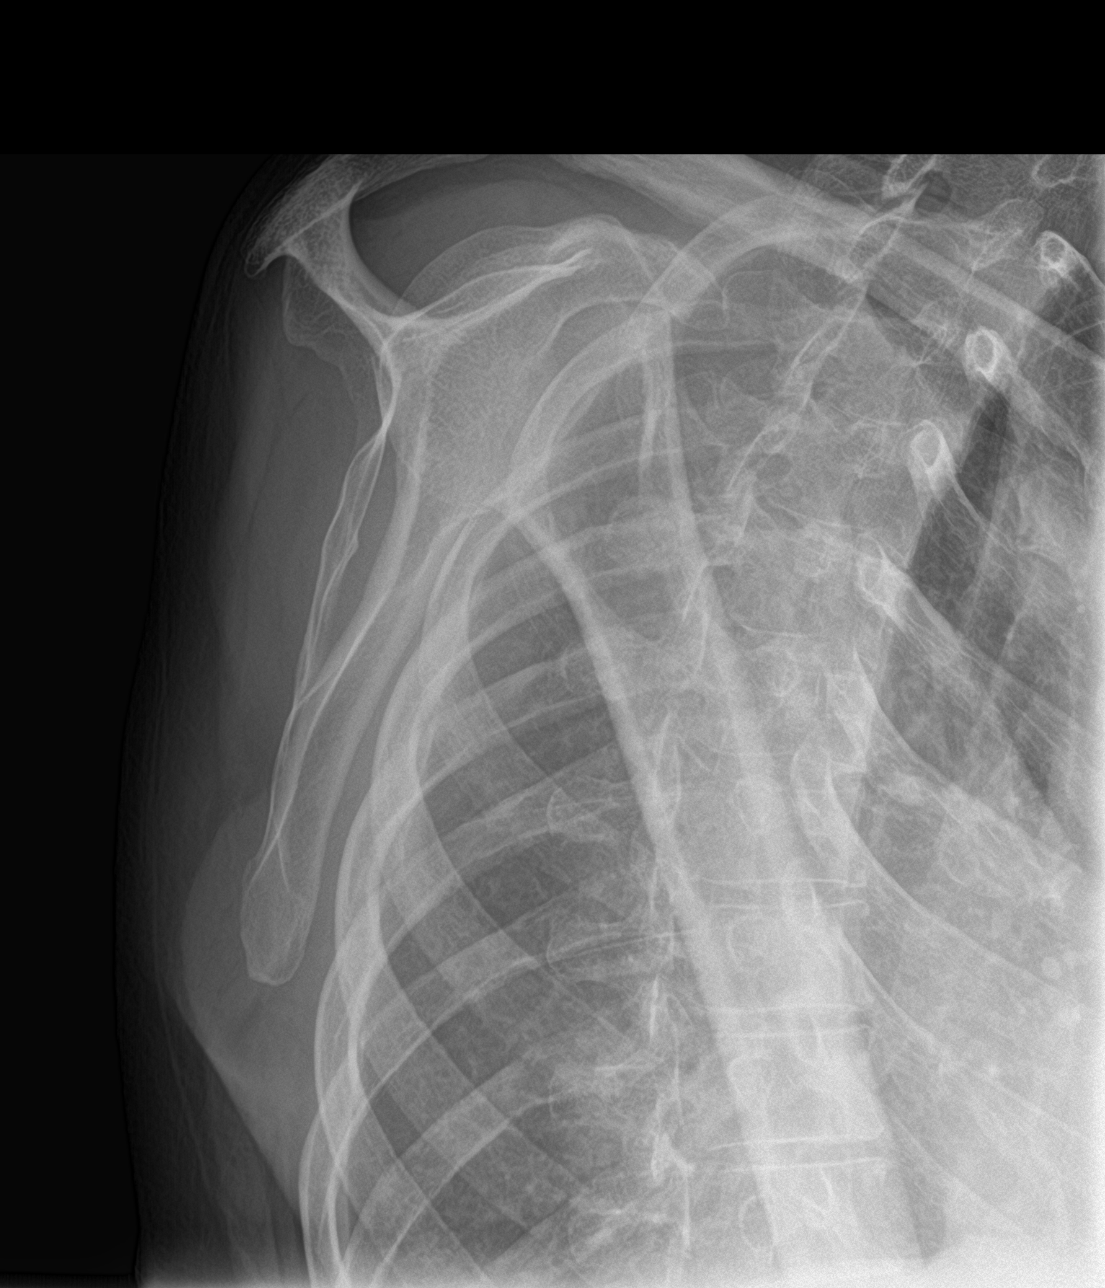

[shoulder axial]
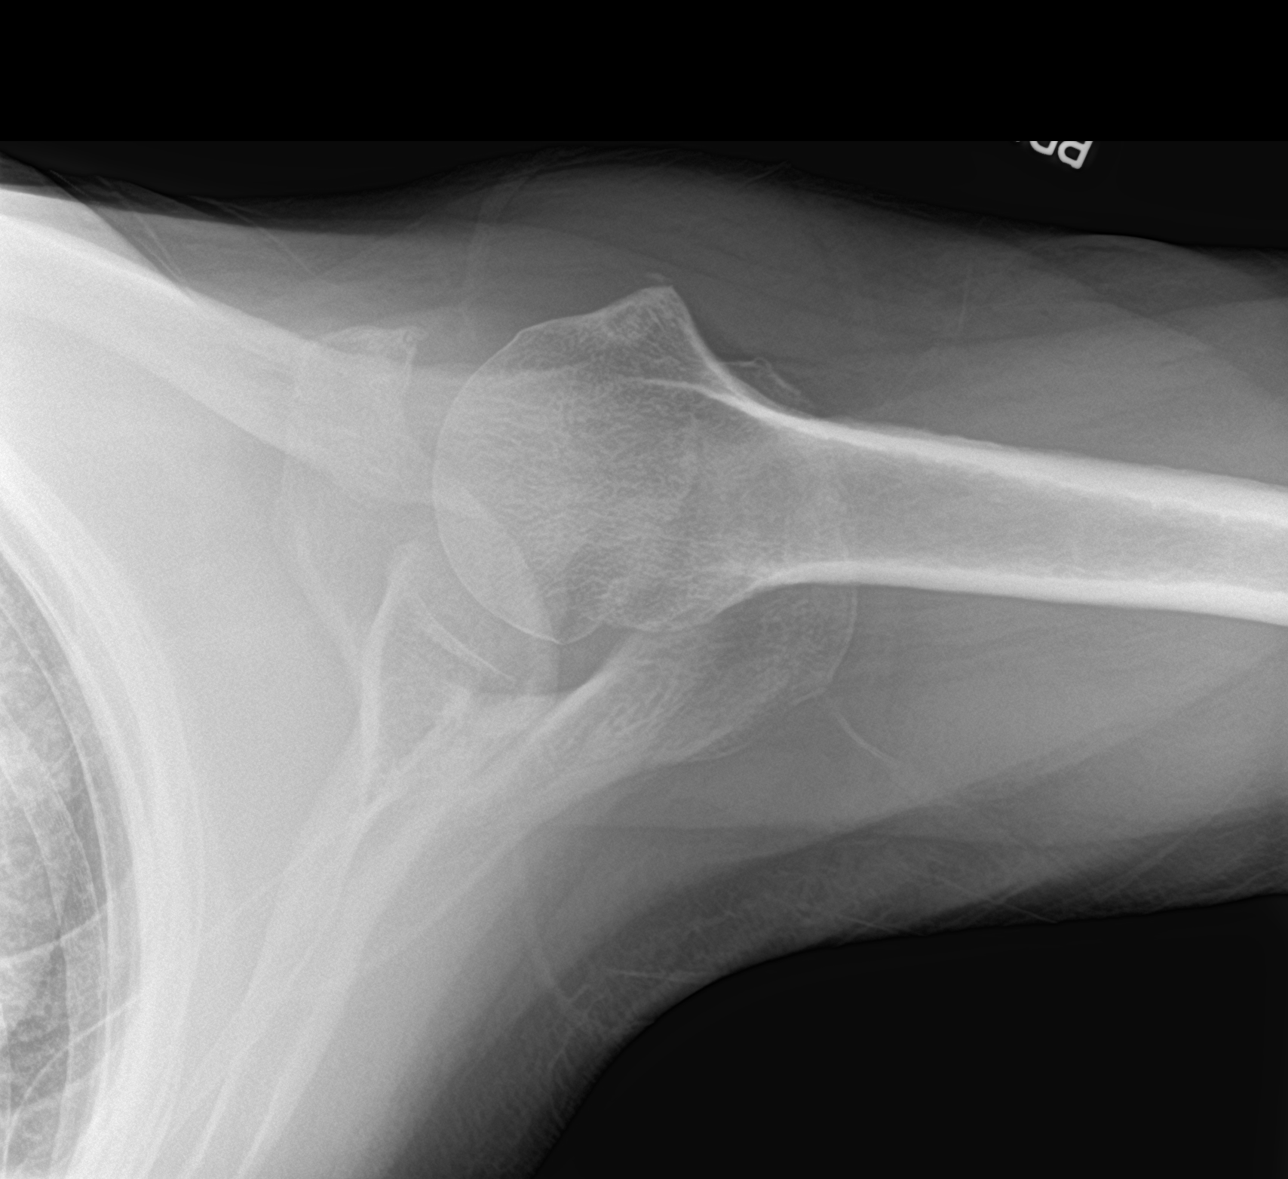

[3 of 3 positions shown; findings below may reference images not displayed]

FINDINGS: There is no evidence of fracture or dislocation. There mild
glenohumeral and acromioclavicular joint space narrowing with
minimal spurring. Soft tissues are unremarkable.
IMPRESSION: 1.  No evidence of fracture or dislocation.

2.  Mild acromioclavicular and glenohumeral osteoarthritis.

## 2022-09-12 ENCOUNTER — Encounter: Payer: Self-pay | Admitting: Endocrinology

## 2022-09-12 LAB — HM DIABETES EYE EXAM

## 2022-10-18 ENCOUNTER — Other Ambulatory Visit: Payer: Self-pay | Admitting: Endocrinology

## 2022-11-14 ENCOUNTER — Other Ambulatory Visit: Payer: Self-pay | Admitting: Endocrinology

## 2022-11-14 DIAGNOSIS — E1165 Type 2 diabetes mellitus with hyperglycemia: Secondary | ICD-10-CM

## 2022-11-15 ENCOUNTER — Other Ambulatory Visit (HOSPITAL_COMMUNITY): Payer: Self-pay

## 2022-11-15 NOTE — Telephone Encounter (Signed)
It does not appear that the medication requires a prior authorization at this time. Diagnosis code of E11.42 may need be to processed through on pharmacy end. Please let us know if anything further is needed. Thank you.

## 2022-11-20 ENCOUNTER — Telehealth: Payer: Self-pay

## 2022-11-20 NOTE — Telephone Encounter (Signed)
Pharmacy called in for Prior Auth for Joseph Sharp. Please start PA

## 2022-11-21 ENCOUNTER — Other Ambulatory Visit (HOSPITAL_COMMUNITY): Payer: Self-pay

## 2022-11-21 NOTE — Telephone Encounter (Signed)
Pharmacy Patient Advocate Encounter   Received notification from [place] that prior authorization for Ozempic is required/requested.  Per Test Claim: $40 - pt has met his deductible.    Called the Pharmacy, but they are still getting a rejection saying it's not medically nec. She said they were billing his [employer]. Not sure why it's rejecting there at [facility]. He may need to call his ins co.

## 2022-11-22 ENCOUNTER — Other Ambulatory Visit: Payer: Self-pay | Admitting: Endocrinology

## 2022-12-15 ENCOUNTER — Encounter: Payer: Self-pay | Admitting: Endocrinology

## 2022-12-15 ENCOUNTER — Other Ambulatory Visit: Payer: Self-pay

## 2022-12-15 DIAGNOSIS — E1165 Type 2 diabetes mellitus with hyperglycemia: Secondary | ICD-10-CM

## 2022-12-15 MED ORDER — OZEMPIC (1 MG/DOSE) 4 MG/3ML ~~LOC~~ SOPN: PEN_INJECTOR | SUBCUTANEOUS | 2 refills | Status: DC

## 2022-12-16 ENCOUNTER — Other Ambulatory Visit: Payer: Self-pay | Admitting: Endocrinology

## 2022-12-27 ENCOUNTER — Other Ambulatory Visit: Payer: Self-pay | Admitting: Endocrinology

## 2023-01-09 ENCOUNTER — Other Ambulatory Visit (INDEPENDENT_AMBULATORY_CARE_PROVIDER_SITE_OTHER): Payer: Medicare PPO

## 2023-01-09 DIAGNOSIS — E1165 Type 2 diabetes mellitus with hyperglycemia: Secondary | ICD-10-CM

## 2023-01-09 LAB — COMPREHENSIVE METABOLIC PANEL
ALT: 13 U/L (ref 0–53)
AST: 12 U/L (ref 0–37)
Albumin: 4.1 g/dL (ref 3.5–5.2)
Alkaline Phosphatase: 72 U/L (ref 39–117)
BUN: 22 mg/dL (ref 6–23)
CO2: 23 mEq/L (ref 19–32)
Calcium: 10.3 mg/dL (ref 8.4–10.5)
Chloride: 108 mEq/L (ref 96–112)
Creatinine, Ser: 0.94 mg/dL (ref 0.40–1.50)
GFR: 82.46 mL/min (ref >60.00)
Glucose, Bld: 173 mg/dL — ABNORMAL HIGH (ref 70–99)
Potassium: 4.1 mEq/L (ref 3.5–5.1)
Sodium: 139 mEq/L (ref 135–145)
Total Bilirubin: 0.6 mg/dL (ref 0.2–1.2)
Total Protein: 6.9 g/dL (ref 6.0–8.3)

## 2023-01-09 LAB — MICROALBUMIN / CREATININE URINE RATIO
Creatinine,U: 110.5 mg/dL
Microalb Creat Ratio: 0.6 mg/g (ref 0.0–30.0)
Microalb, Ur: <0.7 mg/dL (ref 0.0–1.9)

## 2023-01-09 LAB — HEMOGLOBIN A1C: Hgb A1c MFr Bld: 8.4 % — ABNORMAL HIGH (ref 4.6–6.5)

## 2023-01-10 ENCOUNTER — Encounter: Payer: Self-pay | Admitting: Endocrinology

## 2023-01-11 ENCOUNTER — Encounter: Payer: Self-pay | Admitting: Family Medicine

## 2023-01-11 ENCOUNTER — Ambulatory Visit: Payer: Medicare PPO | Admitting: Family Medicine

## 2023-01-11 ENCOUNTER — Ambulatory Visit: Payer: Medicare Other | Admitting: Endocrinology

## 2023-01-11 VITALS — BP 122/60 | HR 92 | Temp 98.5°F | Ht 69.0 in | Wt 166.8 lb

## 2023-01-11 DIAGNOSIS — R197 Diarrhea, unspecified: Secondary | ICD-10-CM | POA: Diagnosis not present

## 2023-01-11 NOTE — Progress Notes (Signed)
Subjective:  Patient ID: Joseph Sharp, male    DOB: 09/08/1951  Age: 72 y.o. MRN: [id number]  CC:  Chief Complaint  Patient presents with   Diarrhea    Pt notes 01/04/23 through yesterday had severe Diarrhea, notes today they are back to normal, notes during Diarrhea also had very sulfery burps     HPI [name] presents for   Diarrhea Occurred over the past week, initial sx' - watery diarrhea on 8/22. No abd pain, some cramping that would resolve with BM. No melena/hematochezia. No fever/n/v. Sulphur belching - that has improved.  Improved. Clear watery diarrhea yesterday afternoon. Small formed BM today - more normal today. No belching today.  Able to drink fluids, urinating normally. Feeling ok today. Declines testing today.  Eating normally now, adjusted when more diarrhea.  No known sick contact.  No new foods/raw foods prior.  Had traveled to [place] on 2/22. Diarrhea started that day.    History Patient Active Problem List   Diagnosis Date Noted   ED (erectile dysfunction) of organic origin 09/04/2014   Allergic rhinitis 08/10/2014   Diabetes mellitus, stable ([place]) 06/17/2013   Pure hypercholesterolemia 06/17/2013   Past Medical History:  Diagnosis Date   Allergy    Diabetes mellitus without complication [facility])    Past Surgical History:  Procedure Laterality Date   EYE SURGERY  01/2013   Eyelid surgery   No Known Allergies Prior to Admission medications   Medication Sig Start Date End Date Taking? Authorizing Provider  Cyanocobalamin (B-12 PO) Take by mouth.   Yes [provider]  fluticasone (FLONASE) 50 MCG/ACT nasal spray Place 2 sprays into both nostrils daily. 10/13/21  Yes Carvel Getting, NP  metFORMIN (GLUCOPHAGE-XR) 500 MG 24 hr tablet TAKE 4 TABLETS BY MOUTH EVERY DAY 10/18/22  Yes Elayne Snare, MD  pioglitazone (ACTOS) 30 MG tablet TAKE 1 TABLET BY MOUTH EVERY DAY 11/22/22  Yes Elayne Snare, MD  pravastatin (PRAVACHOL) 20 MG  tablet Take 1 tablet (20 mg total) by mouth daily. 01/05/22  Yes Elayne Snare, MD  Semaglutide, 1 MG/DOSE, (OZEMPIC, 1 MG/DOSE,) 4 MG/3ML SOPN INJECT 1 MG INTO THE SKIN ONE TIME PER WEEK 12/15/22  Yes Elayne Snare, MD  zinc gluconate 50 MG tablet Take by mouth.   Yes [provider]  glucose blood test strip 1 each by Other route as needed for other. Use as instructed to check blood sugar once daily. Patient not taking: Reported on 06/07/2022 12/19/18   Elayne Snare, MD   Social History   Socioeconomic History   Marital status: Married    Spouse name: [name]   Number of children: 1   Years of education: Not on file   Highest education level: Not on file  Occupational History   Occupation: Engineering    Comment: [employer]  Tobacco Use   Smoking status: Never   Smokeless tobacco: Never  Substance and Sexual Activity   Alcohol use: No   Drug use: No   Sexual activity: Yes  Other Topics Concern   Not on file  Social History Narrative   Lives with his wife.  Their daughter lives in [place], [place].   Social Determinants of Health   Financial Resource Strain: Low Risk  (02/28/2022)   Overall Financial Resource Strain (CARDIA)    Difficulty of Paying Living Expenses: Not hard at all  Food Insecurity: No Food Insecurity (02/28/2022)   Hunger Vital Sign    Worried About Running Out  of Food in the Last Year: Never true    [place] in the Last Year: Never true  Transportation Needs: No Transportation Needs (02/28/2022)   PRAPARE - Hydrologist (Medical): No    Lack of Transportation (Non-Medical): No  Physical Activity: Sufficiently Active (02/28/2022)   Exercise Vital Sign    Days of Exercise per Week: 5 days    Minutes of Exercise per Session: 30 min  Stress: No Stress Concern Present (02/28/2022)   [name]    Feeling of Stress : Not at all  Social Connections: [place] (02/28/2022)   Social Connection and Isolation Panel [NHANES]    Frequency of Communication with Friends and Family: More than three times a week    Frequency of Social Gatherings with Friends and Family: More than three times a week    Attends Religious Services: More than 4 times per year    Active Member of [employer] or Organizations: Yes    Attends Archivist Meetings: More than 4 times per year    Marital Status: Married  Human resources officer Violence: Not At Risk (02/28/2022)   Humiliation, Afraid, Rape, and Kick questionnaire    Fear of Current or Ex-Partner: No    Emotionally Abused: No    Physically Abused: No    Sexually Abused: No    Review of Systems Per HPI  Objective:   Vitals:   01/11/23 1035  BP: 122/60  Pulse: 92  Temp: 98.5 F (36.9 C)  TempSrc: Temporal  SpO2: 98%  Weight: 166 lb 12.8 oz (75.7 kg)  Height: '5\' 9"'$  (1.753 m)     Physical Exam Vitals reviewed.  Constitutional:      Appearance: He is well-developed.  HENT:     Head: Normocephalic and atraumatic.  Neck:     Vascular: No carotid bruit or JVD.  Cardiovascular:     Rate and Rhythm: Normal rate and regular rhythm.     Heart sounds: Normal heart sounds. No murmur heard. Pulmonary:     Effort: Pulmonary effort is normal.     Breath sounds: Normal breath sounds. No rales.  Abdominal:     General: Abdomen is flat. Bowel sounds are normal. There is no distension.     Palpations: Abdomen is soft.     Tenderness: There is no abdominal tenderness. There is no guarding.  Musculoskeletal:     Right lower leg: No edema.     Left lower leg: No edema.  Skin:    General: Skin is warm and dry.  Neurological:     Mental Status: He is alert and oriented to person, place, and time.  Psychiatric:        Mood and Affect: Mood normal.     Assessment & Plan:  ACENCION TONI is a 72 y.o. male . Diarrhea of presumed infectious origin 1-week of symptoms, now resolving.  Suspected  infectious enteritis, infectious diarrhea.  Differential includes norovirus.  No bloody diarrhea, no nausea or vomiting.  Abdomen nontender, reassuring exam at present.  Drinking fluids, appears well-hydrated, tolerating p.o. intake including foods at this time.  Deferred further testing at this point, with RTC precautions.  Bland foods, continue hydration with RTC precautions given.  No orders of the defined types were placed in this encounter.  Patient Instructions  Glad to hear that you have improved.  I suspect you had a viral intestinal infection, and possibly  norovirus.  As long as your symptoms are continuing to improve, no further testing needed at this time.  Make sure to drink plenty of fluids, bland foods initially and slowly increase diet as tolerated.  If any worsening or return of symptoms, let me know.  Take care.     Signed,   Merri Ray, MD [place] 01/11/23 11:10 AM

## 2023-01-11 NOTE — Patient Instructions (Signed)
Glad to hear that you have improved.  I suspect you had a viral intestinal infection, and possibly norovirus.  As long as your symptoms are continuing to improve, no further testing needed at this time.  Make sure to drink plenty of fluids, bland foods initially and slowly increase diet as tolerated.  If any worsening or return of symptoms, let me know.  Take care.

## 2023-03-01 ENCOUNTER — Telehealth: Payer: Self-pay | Admitting: Family Medicine

## 2023-03-01 NOTE — Telephone Encounter (Signed)
Called patient to schedule Medicare Annual Wellness Visit (AWV). Left message for patient to call back and schedule Medicare Annual Wellness Visit (AWV).  Last date of AWV: 02/28/2022   Please schedule an AWVS appointment at any time with [facility] ANNUAL WELLNESS VISIT.  If any questions, please contact me at [phone].    Thank you,  [facility] Support [facility] Direct dial  [phone]

## 2023-03-22 ENCOUNTER — Other Ambulatory Visit: Payer: Self-pay | Admitting: Endocrinology

## 2023-03-22 ENCOUNTER — Other Ambulatory Visit (INDEPENDENT_AMBULATORY_CARE_PROVIDER_SITE_OTHER): Payer: Medicare PPO

## 2023-03-22 DIAGNOSIS — E1165 Type 2 diabetes mellitus with hyperglycemia: Secondary | ICD-10-CM | POA: Diagnosis not present

## 2023-03-22 LAB — BASIC METABOLIC PANEL
BUN: 20 mg/dL (ref 6–23)
CO2: 26 mEq/L (ref 19–32)
Calcium: 9.5 mg/dL (ref 8.4–10.5)
Chloride: 108 mEq/L (ref 96–112)
Creatinine, Ser: 1.07 mg/dL (ref 0.40–1.50)
GFR: 70.49 mL/min (ref >60.00)
Glucose, Bld: 158 mg/dL — ABNORMAL HIGH (ref 70–99)
Potassium: 4.4 mEq/L (ref 3.5–5.1)
Sodium: 141 mEq/L (ref 135–145)

## 2023-03-22 LAB — LIPID PANEL
Cholesterol: 143 mg/dL (ref 0–200)
HDL: 40.4 mg/dL (ref >39.00)
LDL Cholesterol: 84 mg/dL (ref 0–99)
NonHDL: 103.04
Total CHOL/HDL Ratio: 4
Triglycerides: 97 mg/dL (ref 0.0–149.0)
VLDL: 19.4 mg/dL (ref 0.0–40.0)

## 2023-03-23 DIAGNOSIS — N486 Induration penis plastica: Secondary | ICD-10-CM | POA: Diagnosis not present

## 2023-03-23 DIAGNOSIS — N5201 Erectile dysfunction due to arterial insufficiency: Secondary | ICD-10-CM | POA: Diagnosis not present

## 2023-03-24 LAB — FRUCTOSAMINE: Fructosamine: 300 umol/L — ABNORMAL HIGH (ref 0–285)

## 2023-03-27 ENCOUNTER — Encounter: Payer: Self-pay | Admitting: Endocrinology

## 2023-03-27 ENCOUNTER — Ambulatory Visit: Payer: Medicare PPO | Admitting: Endocrinology

## 2023-03-27 VITALS — BP 136/80 | HR 80 | Ht 69.0 in | Wt 170.6 lb

## 2023-03-27 DIAGNOSIS — Z7985 Long-term (current) use of injectable non-insulin antidiabetic drugs: Secondary | ICD-10-CM | POA: Diagnosis not present

## 2023-03-27 DIAGNOSIS — E1165 Type 2 diabetes mellitus with hyperglycemia: Secondary | ICD-10-CM

## 2023-03-27 DIAGNOSIS — E785 Hyperlipidemia, unspecified: Secondary | ICD-10-CM

## 2023-03-27 MED ORDER — GLIMEPIRIDE 2 MG PO TABS: 2.0000 mg | ORAL_TABLET | Freq: Every day | ORAL | 3 refills | Status: DC

## 2023-03-27 MED ORDER — PRAVASTATIN SODIUM 20 MG PO TABS: 20.0000 mg | ORAL_TABLET | Freq: Every day | ORAL | 1 refills | Status: DC

## 2023-03-27 NOTE — Progress Notes (Signed)
Patient ID: Joseph Sharp, male   DOB: June 26, 1951, 72 y.o.   MRN: [id number]   Reason for Appointment: follow-up for diabetes and blood pressure   History of Present Illness    Diagnosis: Type 2 DIABETES MELITUS, diagnosed in 1977     PAST history: He has had long-standing diabetes managed with multiple drugs and usually well controlled His blood sugar control is somewhat dependent on his compliance with diet and exercise regimen Amaryl was stopped in 2014 because of tendency to hypoglycemia and Prandin was started  In 11 /14 his blood sugars were higher overall and A1c was 7% which was higher than usual Because of this he was started on Trulicity injections, 0.75 mg He did benefit overall from starting 0.75 mg Trulicity  RECENT history:  His A1c is 8.4 as of 2/24 Fructosamine is 300 Previously lowest A1c 6.4  Last visit was 06/2022   Non-insulin hypoglycemic drugs: Metformin 2g, pioglitazone 30 mg, Ozempic 0.5 mg weekly  Current management, blood sugar patterns and problems identified: A1c was higher because of running out of Ozempic at least in January, with this his blood sugars were occasionally up to 300 He has been checking his blood sugars mostly in the last week or so  Recently fasting readings appear to be consistently high and also readings after dinner During the day with blood sugars are fairly good and no hypoglycemia Unclear why has not taken any Amaryl for some time and not clear when this was stopped He was told to start back on a brand-name glucose monitor on his last visit usual only a few days before his visit  Has not done much regular exercise and not motivated Usually trying to watch diet fairly well He is taking the same dose of 0.5 mg Ozempic, dialing halfway on the 1 mg pen to save cost Has gained weight   EXERCISE: With walking 2/7  days  Side effects from medications: None  Monitors blood glucose:  1+ times a day     Glucometer:           Blood Glucose readings by download of meter:   PRE-MEAL Fasting Lunch Dinner Bedtime Overall  Glucose range: 151-192 99 108, 129    Mean/median:     145   POST-MEAL PC Breakfast PC Lunch PC Dinner  Glucose range: 130, 144  177, 174  Mean/median:      Previously:  PRE-MEAL Fasting Lunch Dinner Bedtime Overall  Glucose range: 94-117 146 0.5    Mean/median:     136   POST-MEAL PC Breakfast PC Lunch PC Dinner  Glucose range: 166, 172  137-172  Mean/median:        Dinner usually at 7 pm     Wt Readings from Last 3 Encounters:  03/27/23 170 lb 9.6 oz (77.4 kg)  01/11/23 166 lb 12.8 oz (75.7 kg)  07/06/22 167 lb (75.8 kg)   Lab Results  Component Value Date   HGBA1C 8.4 (H) 01/09/2023   HGBA1C 6.9 (H) 07/04/2022   HGBA1C 6.7 (H) 01/03/2022   Lab Results  Component Value Date   MICROALBUR <0.7 01/09/2023   LDLCALC 84 03/22/2023   CREATININE 1.07 03/22/2023     Allergies as of 03/27/2023   No Known Allergies      Medication List        Accurate as of Mar 27, 2023 11:20 AM. If you have any questions, ask your nurse or doctor.  B-12 PO Take by mouth.   fluticasone 50 MCG/ACT nasal spray Commonly known as: FLONASE Place 2 sprays into both nostrils daily.   glucose blood test strip 1 each by Other route as needed for other. Use as instructed to check blood sugar once daily.   metFORMIN 500 MG 24 hr tablet Commonly known as: GLUCOPHAGE-XR TAKE 4 TABLETS BY MOUTH EVERY DAY   Ozempic (1 MG/DOSE) 4 MG/3ML Sopn Generic drug: Semaglutide (1 MG/DOSE) INJECT 1 MG INTO THE SKIN ONE TIME PER WEEK   pioglitazone 30 MG tablet Commonly known as: ACTOS TAKE 1 TABLET BY MOUTH EVERY DAY   pravastatin 20 MG tablet Commonly known as: PRAVACHOL Take 1 tablet (20 mg total) by mouth daily.   tadalafil 5 MG tablet Commonly known as: CIALIS Take 5 mg by mouth daily.   zinc gluconate 50 MG tablet Take by mouth.        Allergies: No Known  Allergies  Past Medical History:  Diagnosis Date   Allergy    Diabetes mellitus without complication [facility])     Past Surgical History:  Procedure Laterality Date   EYE SURGERY  01/2013   Eyelid surgery    Family History  Problem Relation Age of Onset   Diabetes Mother    Arthritis Mother    Diabetes Brother    Cancer Neg Hx    Heart disease Neg Hx     Social History:  reports that he has never smoked. He has never used smokeless tobacco. He reports that he does not drink alcohol and does not use drugs.   Review of Systems    Blood pressure is consistently in the normal range without pharmacological therapy   BP Readings from Last 3 Encounters:  03/27/23 136/80  01/11/23 122/60  07/06/22 108/68   CREATININE level stable  Lab Results  Component Value Date   CREATININE 1.07 03/22/2023   CREATININE 0.94 01/09/2023   CREATININE 1.11 07/04/2022     HYPERLIPIDEMIA: The lipid abnormality consists of elevated LDL and triglycerides and low HDL LDL previously well controlled with lovastatin Has low HDL from metabolic syndrome and history of high triglycerides  He had stopped lovastatin in 2020 and again after a trial subsequently because of leg pain  His triglycerides have been consistently normal  He is not taking pravastatin as he ran out, previously was started on this mostly for cardiovascular risk reduction  LDL consistently under 100   Lab Results  Component Value Date   CHOL 143 03/22/2023   CHOL 119 07/04/2022   CHOL 144 08/25/2021   Lab Results  Component Value Date   HDL 40.40 03/22/2023   HDL 40.70 07/04/2022   HDL 36.40 (L) 08/25/2021   Lab Results  Component Value Date   LDLCALC 84 03/22/2023   LDLCALC 59 07/04/2022   LDLCALC 86 08/25/2021   Lab Results  Component Value Date   TRIG 97.0 03/22/2023   TRIG 94.0 07/04/2022   TRIG 107.0 08/25/2021   Lab Results  Component Value Date   CHOLHDL 4 03/22/2023   CHOLHDL 3 07/04/2022    CHOLHDL 4 08/25/2021   Lab Results  Component Value Date   LDLDIRECT 95.0 04/18/2019     He takes vitamin D 1000 units OTC as recommended for supplementation with normal levels  Lab Results  Component Value Date   VD25OH 66.01 05/26/2021   VD25OH 54.76 03/29/2020     No complaints of numbness in the feet     Examination:  BP 136/80 (BP Location: Left Arm, Patient Position: Sitting, Cuff Size: Normal)   Pulse 80   Ht 5\' 9"  (1.753 m)   Wt 170 lb 9.6 oz (77.4 kg)   SpO2 98%   BMI 25.19 kg/m   Body mass index is 25.19 kg/m.     Diabetic Foot Exam - Simple   Simple Foot Form Diabetic Foot exam was performed with the following findings: Yes   Visual Inspection No deformities, no ulcerations, no other skin breakdown bilaterally: Yes Sensation Testing Intact to touch and monofilament testing bilaterally: Yes Pulse Check Posterior Tibialis and Dorsalis pulse intact bilaterally: Yes Comments    No edema  ASSESSMENT/ PLAN:    DIABETES, non-insulin-dependent:   See history of present illness for detailed discussion of current diabetes management, blood sugar patterns and problems identified  His A1c is significantly higher as of February but fructosamine is 300 now  However his fasting readings are significantly high likely from not taking Amaryl possibly from running out of the prescription Weight has gone up  He is doing better with going back on Ozempic However not clear why he is not taking Amaryl since last year With this his blood sugars are higher after dinner and fasting Also can do better with more exercise  For now he will start back on Amaryl 2 mg daily at dinnertime He will continue same dose of Ozempic 0.5 mg weekly  Cardiovascular risk reduction: Needs to get back on pravastatin and discussed benefits of this regardless of LDL level  Follow-up in 3 months  There are no Patient Instructions on file for this visit.   Reather Littler 03/27/2023, 11:20  AM   Note: This office note was prepared with Dragon voice recognition system technology. Any transcriptional errors that result from this process are unintentional.

## 2023-03-27 NOTE — Patient Instructions (Signed)
Start walking daily

## 2023-03-30 ENCOUNTER — Other Ambulatory Visit: Payer: Self-pay | Admitting: Endocrinology

## 2023-03-30 DIAGNOSIS — E1165 Type 2 diabetes mellitus with hyperglycemia: Secondary | ICD-10-CM

## 2023-04-06 ENCOUNTER — Encounter: Payer: Medicare PPO | Admitting: Family Medicine

## 2023-04-06 DIAGNOSIS — E785 Hyperlipidemia, unspecified: Secondary | ICD-10-CM | POA: Insufficient documentation

## 2023-04-06 DIAGNOSIS — E119 Type 2 diabetes mellitus without complications: Secondary | ICD-10-CM | POA: Insufficient documentation

## 2023-04-18 ENCOUNTER — Ambulatory Visit (INDEPENDENT_AMBULATORY_CARE_PROVIDER_SITE_OTHER): Payer: Medicare PPO | Admitting: *Deleted

## 2023-04-18 DIAGNOSIS — Z0000 Encounter for general adult medical examination without abnormal findings: Secondary | ICD-10-CM

## 2023-04-18 NOTE — Progress Notes (Signed)
Subjective:   Joseph Sharp is a 72 y.o. male who presents for Medicare Annual/Subsequent preventive examination.  I connected with  Bishop Limbo on 04/18/23 by a telephone enabled telemedicine application and verified that I am speaking with the correct person using two identifiers.   I discussed the limitations of evaluation and management by telemedicine. The patient expressed understanding and agreed to proceed.  Patient location: home  Provider location: telephone/home   Review of Systems     Cardiac Risk Factors include: advanced age (>3men, >25 women);diabetes mellitus;male gender     Objective:    Today's Vitals   04/18/23 1117  PainSc: 1    There is no height or weight on file to calculate BMI.     04/18/2023   11:29 AM 02/28/2022    2:52 PM 02/15/2022    9:39 AM 08/28/2018    2:19 PM  Advanced Directives  Does Patient Have a Medical Advance Directive? No No No No  Would patient like information on creating a medical advance directive? No - Patient declined No - Patient declined No - Patient declined Yes (MAU/Ambulatory/Procedural Areas - Information given)    Current Medications (verified) Outpatient Encounter Medications as of 04/18/2023  Medication Sig   aspirin 81 MG chewable tablet 1 tablet Orally Once a day   Cyanocobalamin (B-12 PO) Take by mouth.   fluticasone (FLONASE) 50 MCG/ACT nasal spray Place 2 sprays into both nostrils daily.   glimepiride (AMARYL) 2 MG tablet Take 1 tablet (2 mg total) by mouth daily with supper.   glucose blood test strip 1 each by Other route as needed for other. Use as instructed to check blood sugar once daily.   lovastatin (MEVACOR) 20 MG tablet TAKE 1 TABLET EVERY DAY WITH A MEAL orally once a day for 90 days   metFORMIN (GLUCOPHAGE-XR) 500 MG 24 hr tablet TAKE 4 TABLETS BY MOUTH EVERY DAY   pioglitazone (ACTOS) 30 MG tablet TAKE 1 TABLET BY MOUTH EVERY DAY   pravastatin (PRAVACHOL) 20 MG tablet Take 1 tablet  (20 mg total) by mouth daily.   Semaglutide, 1 MG/DOSE, (OZEMPIC, 1 MG/DOSE,) 4 MG/3ML SOPN INJECT 1 MG INTO THE SKIN ONE TIME PER WEEK   tadalafil (CIALIS) 5 MG tablet Take 5 mg by mouth daily.   zinc gluconate 50 MG tablet Take by mouth.   No facility-administered encounter medications on file as of 04/18/2023.    Allergies (verified) Patient has no known allergies.   History: Past Medical History:  Diagnosis Date   Allergy    Diabetes mellitus without complication (HCC)    Past Surgical History:  Procedure Laterality Date   EYE SURGERY  01/2013   Eyelid surgery   Family History  Problem Relation Age of Onset   Diabetes Mother    Arthritis Mother    Diabetes Brother    Cancer Neg Hx    Heart disease Neg Hx    Social History   Socioeconomic History   Marital status: Married    Spouse name: Misty Stanley   Number of children: 1   Years of education: Not on file   Highest education level: Not on file  Occupational History   Occupation: Engineering    Comment: [employer]  Tobacco Use   Smoking status: Never   Smokeless tobacco: Never  Substance and Sexual Activity   Alcohol use: No   Drug use: No   Sexual activity: Yes  Other Topics Concern   Not on file  Social  History Narrative   Lives with his wife.  Their daughter lives in [place], [place].   Social Determinants of Health   Financial Resource Strain: Low Risk  (04/18/2023)   Overall Financial Resource Strain (CARDIA)    Difficulty of Paying Living Expenses: Not hard at all  Food Insecurity: No Food Insecurity (04/18/2023)   Hunger Vital Sign    Worried About Running Out of Food in the Last Year: Never true    Ran Out of Food in the Last Year: Never true  Transportation Needs: No Transportation Needs (04/18/2023)   PRAPARE - Administrator, Civil Service (Medical): No    Lack of Transportation (Non-Medical): No  Physical Activity: Inactive (04/18/2023)   Exercise Vital Sign    Days of Exercise per  Week: 0 days    Minutes of Exercise per Session: 0 min  Stress: No Stress Concern Present (02/28/2022)   [employer] of Occupational Health - Occupational Stress Questionnaire    Feeling of Stress : Not at all  Social Connections: Moderately Integrated (04/18/2023)   Social Connection and Isolation Panel [NHANES]    Frequency of Communication with Friends and Family: More than three times a week    Frequency of Social Gatherings with Friends and Family: Never    Attends Religious Services: More than 4 times per year    Active Member of [employer] or Organizations: No    Attends Engineer, structural: Never    Marital Status: Married    Tobacco Counseling Counseling given: Not Answered   Clinical Intake:  Pre-visit preparation completed: Yes  Pain : 0-10 Pain Score: 1  Pain Location: Back Pain Descriptors / Indicators: Aching, Dull Pain Onset: More than a month ago Pain Frequency: Intermittent     Diabetes: Yes CBG done?: No Did pt. bring in CBG monitor from home?: No  How often do you need to have someone help you when you read instructions, pamphlets, or other written materials from your doctor or pharmacy?: 1 - Never  Diabetic?  Yes  Nutrition Risk Assessment:  Has the patient had any N/V/D within the last 2 months?  Yes  Does the patient have any non-healing wounds?  No  Has the patient had any unintentional weight loss or weight gain?  No   Diabetes:  Is the patient diabetic?  Yes  If diabetic, was a CBG obtained today?  No  Did the patient bring in their glucometer from home?  No  How often do you monitor your CBG's? 2  a month.   Financial Strains and Diabetes Management:  Are you having any financial strains with the device, your supplies or your medication? No .  Does the patient want to be seen by Chronic Care Management for management of their diabetes?  No  Would the patient like to be referred to a Nutritionist or for Diabetic Management?  No    Diabetic Exams:  Diabetic Eye Exam: Completed . Pt has been advised about the importance in completing this exam. A referral has been placed today. Message sent to referral coordinator for scheduling purposes. Advised pt to expect a call from office referred to regarding appt.  Diabetic Foot Exam: Completed . Pt has been advised about the importance in completing this exam.   Interpreter Needed?: No  Information entered by :: Remi Haggard LPN   Activities of Daily Living    04/18/2023   11:19 AM  In your present state of health, do you have any  difficulty performing the following activities:  Hearing? 1  Vision? 0  Difficulty concentrating or making decisions? 0  Walking or climbing stairs? 0  Dressing or bathing? 0  Doing errands, shopping? 0  Preparing Food and eating ? N  Using the Toilet? N  Do you have problems with loss of bowel control? N  Managing your Medications? N  Managing your Finances? N  Housekeeping or managing your Housekeeping? N    Patient Care Team: Shade Flood, MD as PCP - General (Family Medicine) Reather Littler, MD as Consulting Physician (Endocrinology)  Indicate any recent Medical Services you may have received from other than [facility] providers in the past year (date may be approximate).     Assessment:   This is a routine wellness examination for Doctor Phillips.  Hearing/Vision screen Hearing Screening - Comments:: Some trouble  Does not wear hearing Vision Screening - Comments:: Up to date My eye doctor [facility] diabetic  Dietary issues and exercise activities discussed: Current Exercise Habits: The patient does not participate in regular exercise at present   Goals Addressed             This Visit's Progress    Patient Stated       Travel over seas       Depression Screen    04/18/2023   11:23 AM 01/11/2023   10:33 AM 06/07/2022    9:19 AM 03/30/2022    9:19 AM 02/28/2022    2:57 PM 01/23/2022    1:44 PM 03/11/2020    4:24 PM  PHQ  2/9 Scores  PHQ - 2 Score 0 0 0 0 0 0 0  PHQ- 9 Score 2 0 2 1  2      Fall Risk    04/18/2023   11:18 AM 01/11/2023   10:33 AM 06/07/2022    9:19 AM 03/30/2022    9:19 AM 02/28/2022    2:55 PM  Fall Risk   Falls in the past year? 0 0 0 0 0  Number falls in past yr: 0 0 0 0 0  Injury with Fall? 0 0 0 0 0  Risk for fall due to :  No Fall Risks No Fall Risks No Fall Risks No Fall Risks  Follow up Falls evaluation completed;Education provided;Falls prevention discussed Falls evaluation completed Falls evaluation completed Falls evaluation completed Falls evaluation completed    FALL RISK PREVENTION PERTAINING TO THE HOME:  Any stairs in or around the home? Yes  If so, are there any without handrails? No  Home free of loose throw rugs in walkways, pet beds, electrical cords, etc? Yes  Adequate lighting in your home to reduce risk of falls? Yes   ASSISTIVE DEVICES UTILIZED TO PREVENT FALLS:  Life alert? No  Use of a cane, walker or w/c? No  Grab bars in the bathroom? Yes  Shower chair or bench in shower? Yes  Elevated toilet seat or a handicapped toilet? Yes   TIMED UP AND GO:  Was the test performed? No .   Cognitive Function:        04/18/2023   11:21 AM 02/28/2022    3:05 PM 08/28/2018    2:17 PM  6CIT Screen  What Year? 0 points 0 points 0 points  What month? 0 points 0 points 0 points  What time? 0 points 0 points 0 points  Count back from 20 0 points 0 points 0 points  Months in reverse 0 points 0 points 0 points  Repeat phrase  0 points 0 points 0 points  Total Score 0 points 0 points 0 points    Immunizations Immunization History  Administered Date(s) Administered   Influenza, High Dose Seasonal PF 08/28/2018, 08/26/2019, 08/21/2021   Influenza,inj,Quad PF,6+ Mos 09/04/2014, 08/18/2015, 08/08/2016, 10/12/2017   PFIZER(Purple Top)SARS-COV-2 Vaccination 12/03/2019, 12/24/2019, 04/29/2021   Pfizer Covid-19 Vaccine Bivalent Booster 57yrs & up 08/21/2021    Pneumococcal Conjugate-13 04/26/2016   Pneumococcal Polysaccharide-23 08/28/2018   Zoster, Live 09/04/2014    TDAP status: Due, Education has been provided regarding the importance of this vaccine. Advised may receive this vaccine at local pharmacy or Health Dept. Aware to provide a copy of the vaccination record if obtained from local pharmacy or Health Dept. Verbalized acceptance and understanding.  Flu Vaccine status: Up to date  Pneumococcal vaccine status: Up to date  Covid-19 vaccine status: Information provided on how to obtain vaccines.   Qualifies for Shingles Vaccine? Yes   Zostavax completed No   Shingrix Completed?: No.    Education has been provided regarding the importance of this vaccine. Patient has been advised to call insurance company to determine out of pocket expense if they have not yet received this vaccine. Advised may also receive vaccine at local pharmacy or Health Dept. Verbalized acceptance and understanding.  Screening Tests Health Maintenance  Topic Date Due   DTaP/Tdap/Td (1 - Tdap) Never done   COVID-19 Vaccine (5 - 2023-24 season) 05/04/2023 (Originally 07/14/2022)   Zoster Vaccines- Shingrix (1 of 2) 07/19/2023 (Originally 01/28/2003)   INFLUENZA VACCINE  06/14/2023   HEMOGLOBIN A1C  07/10/2023   OPHTHALMOLOGY EXAM  09/13/2023   Diabetic kidney evaluation - Urine ACR  01/10/2024   Diabetic kidney evaluation - eGFR measurement  03/21/2024   FOOT EXAM  03/26/2024   Medicare Annual Wellness (AWV)  04/17/2024   Colonoscopy  10/20/2030   Pneumonia Vaccine 44+ Years old  Completed   Hepatitis C Screening  Completed   HPV VACCINES  Aged Out    Health Maintenance  Health Maintenance Due  Topic Date Due   DTaP/Tdap/Td (1 - Tdap) Never done    Colorectal cancer screening: Type of screening: Colonoscopy. Completed 2021. Repeat every 10 years  Lung Cancer Screening: (Low Dose CT Chest recommended if Age 18-80 years, 30 pack-year currently smoking OR  have quit w/in 15years.) does not qualify.   Lung Cancer Screening Referral:   Additional Screening:  Hepatitis C Screening: does not qualify; Completed 2017  Vision Screening: Recommended annual ophthalmology exams for early detection of glaucoma and other disorders of the eye. Is the patient up to date with their annual eye exam?  Yes  Who is the provider or what is the name of the office in which the patient attends annual eye exams? [facility] If pt is not established with a provider, would they like to be referred to a provider to establish care? No .   Dental Screening: Recommended annual dental exams for proper oral hygiene  Community Resource Referral / Chronic Care Management: CRR required this visit?  No   CCM required this visit?  No      Plan:     I have personally reviewed and noted the following in the patient's chart:   Medical and social history Use of alcohol, tobacco or illicit drugs  Current medications and supplements including opioid prescriptions. Patient is not currently taking opioid prescriptions. Functional ability and status Nutritional status Physical activity Advanced directives List of other physicians Hospitalizations, surgeries, and ER visits in previous  12 months Vitals Screenings to include cognitive, depression, and falls Referrals and appointments  In addition, I have reviewed and discussed with patient certain preventive protocols, quality metrics, and best practice recommendations. A written personalized care plan for preventive services as well as general preventive health recommendations were provided to patient.     Remi Haggard, LPN   11/18/1094   Nurse Notes:

## 2023-04-18 NOTE — Patient Instructions (Signed)
Joseph Sharp , Thank you for taking time to come for your Medicare Wellness Visit. I appreciate your ongoing commitment to your health goals. Please review the following plan we discussed and let me know if I can assist you in the future.   Screening recommendations/referrals: Colonoscopy: up to date Recommended yearly ophthalmology/optometry visit for glaucoma screening and checkup Recommended yearly dental visit for hygiene and checkup  Vaccinations: Influenza vaccine: up to date Pneumococcal vaccine: up to date Tdap vaccine: Education provided Shingles vaccine: Education provided    Advanced directives: Education provided      Preventive Care 65 Years and Older, Male Preventive care refers to lifestyle choices and visits with your health care provider that can promote health and wellness. What does preventive care include? A yearly physical exam. This is also called an annual well check. Dental exams once or twice a year. Routine eye exams. Ask your health care provider how often you should have your eyes checked. Personal lifestyle choices, including: Daily care of your teeth and gums. Regular physical activity. Eating a healthy diet. Avoiding tobacco and drug use. Limiting alcohol use. Practicing safe sex. Taking low doses of aspirin every day. Taking vitamin and mineral supplements as recommended by your health care provider. What happens during an annual well check? The services and screenings done by your health care provider during your annual well check will depend on your age, overall health, lifestyle risk factors, and family history of disease. Counseling  Your health care provider may ask you questions about your: Alcohol use. Tobacco use. Drug use. Emotional well-being. Home and relationship well-being. Sexual activity. Eating habits. History of falls. Memory and ability to understand (cognition). Work and work Astronomer. Screening  You may have  the following tests or measurements: Height, weight, and BMI. Blood pressure. Lipid and cholesterol levels. These may be checked every 5 years, or more frequently if you are over 43 years old. Skin check. Lung cancer screening. You may have this screening every year starting at age 60 if you have a 30-pack-year history of smoking and currently smoke or have quit within the past 15 years. Fecal occult blood test (FOBT) of the stool. You may have this test every year starting at age 46. Flexible sigmoidoscopy or colonoscopy. You may have a sigmoidoscopy every 5 years or a colonoscopy every 10 years starting at age 49. Prostate cancer screening. Recommendations will vary depending on your family history and other risks. Hepatitis C blood test. Hepatitis B blood test. Sexually transmitted disease (STD) testing. Diabetes screening. This is done by checking your blood sugar (glucose) after you have not eaten for a while (fasting). You may have this done every 1-3 years. Abdominal aortic aneurysm (AAA) screening. You may need this if you are a current or former smoker. Osteoporosis. You may be screened starting at age 78 if you are at high risk. Talk with your health care provider about your test results, treatment options, and if necessary, the need for more tests. Vaccines  Your health care provider may recommend certain vaccines, such as: Influenza vaccine. This is recommended every year. Tetanus, diphtheria, and acellular pertussis (Tdap, Td) vaccine. You may need a Td booster every 10 years. Zoster vaccine. You may need this after age 48. Pneumococcal 13-valent conjugate (PCV13) vaccine. One dose is recommended after age 71. Pneumococcal polysaccharide (PPSV23) vaccine. One dose is recommended after age 68. Talk to your health care provider about which screenings and vaccines you need and how often you need them.  This information is not intended to replace advice given to you by your health  care provider. Make sure you discuss any questions you have with your health care provider. Document Released: 11/26/2015 Document Revised: 07/19/2016 Document Reviewed: 08/31/2015 Elsevier Interactive Patient Education  2017 [employer].  Fall Prevention in the Home Falls can cause injuries. They can happen to people of all ages. There are many things you can do to make your home safe and to help prevent falls. What can I do on the outside of my home? Regularly fix the edges of walkways and driveways and fix any cracks. Remove anything that might make you trip as you walk through a door, such as a raised step or threshold. Trim any bushes or trees on the path to your home. Use bright outdoor lighting. Clear any walking paths of anything that might make someone trip, such as rocks or tools. Regularly check to see if handrails are loose or broken. Make sure that both sides of any steps have handrails. Any raised decks and porches should have guardrails on the edges. Have any leaves, snow, or ice cleared regularly. Use sand or salt on walking paths during winter. Clean up any spills in your garage right away. This includes oil or grease spills. What can I do in the bathroom? Use night lights. Install grab bars by the toilet and in the tub and shower. Do not use towel bars as grab bars. Use non-skid mats or decals in the tub or shower. If you need to sit down in the shower, use a plastic, non-slip stool. Keep the floor dry. Clean up any water that spills on the floor as soon as it happens. Remove soap buildup in the tub or shower regularly. Attach bath mats securely with double-sided non-slip rug tape. Do not have throw rugs and other things on the floor that can make you trip. What can I do in the bedroom? Use night lights. Make sure that you have a light by your bed that is easy to reach. Do not use any sheets or blankets that are too big for your bed. They should not hang down onto the  floor. Have a firm chair that has side arms. You can use this for support while you get dressed. Do not have throw rugs and other things on the floor that can make you trip. What can I do in the kitchen? Clean up any spills right away. Avoid walking on wet floors. Keep items that you use a lot in easy-to-reach places. If you need to reach something above you, use a strong step stool that has a grab bar. Keep electrical cords out of the way. Do not use floor polish or wax that makes floors slippery. If you must use wax, use non-skid floor wax. Do not have throw rugs and other things on the floor that can make you trip. What can I do with my stairs? Do not leave any items on the stairs. Make sure that there are handrails on both sides of the stairs and use them. Fix handrails that are broken or loose. Make sure that handrails are as long as the stairways. Check any carpeting to make sure that it is firmly attached to the stairs. Fix any carpet that is loose or worn. Avoid having throw rugs at the top or bottom of the stairs. If you do have throw rugs, attach them to the floor with carpet tape. Make sure that you have a light switch at the  top of the stairs and the bottom of the stairs. If you do not have them, ask someone to add them for you. What else can I do to help prevent falls? Wear shoes that: Do not have high heels. Have rubber bottoms. Are comfortable and fit you well. Are closed at the toe. Do not wear sandals. If you use a stepladder: Make sure that it is fully opened. Do not climb a closed stepladder. Make sure that both sides of the stepladder are locked into place. Ask someone to hold it for you, if possible. Clearly mark and make sure that you can see: Any grab bars or handrails. First and last steps. Where the edge of each step is. Use tools that help you move around (mobility aids) if they are needed. These include: Canes. Walkers. Scooters. Crutches. Turn on the  lights when you go into a dark area. Replace any light bulbs as soon as they burn out. Set up your furniture so you have a clear path. Avoid moving your furniture around. If any of your floors are uneven, fix them. If there are any pets around you, be aware of where they are. Review your medicines with your doctor. Some medicines can make you feel dizzy. This can increase your chance of falling. Ask your doctor what other things that you can do to help prevent falls. This information is not intended to replace advice given to you by your health care provider. Make sure you discuss any questions you have with your health care provider. Document Released: 08/26/2009 Document Revised: 04/06/2016 Document Reviewed: 12/04/2014 Elsevier Interactive Patient Education  2017 [employer].

## 2023-05-08 DIAGNOSIS — M5451 Vertebrogenic low back pain: Secondary | ICD-10-CM | POA: Diagnosis not present

## 2023-05-29 DIAGNOSIS — M5451 Vertebrogenic low back pain: Secondary | ICD-10-CM | POA: Diagnosis not present

## 2023-06-11 ENCOUNTER — Other Ambulatory Visit: Payer: Medicare PPO

## 2023-06-11 ENCOUNTER — Encounter: Payer: Medicare Other | Admitting: Family Medicine

## 2023-06-11 DIAGNOSIS — E785 Hyperlipidemia, unspecified: Secondary | ICD-10-CM | POA: Diagnosis not present

## 2023-06-11 DIAGNOSIS — E1165 Type 2 diabetes mellitus with hyperglycemia: Secondary | ICD-10-CM | POA: Diagnosis not present

## 2023-06-11 LAB — BASIC METABOLIC PANEL
BUN: 19 mg/dL (ref 6–23)
CO2: 23 mEq/L (ref 19–32)
Calcium: 10.3 mg/dL (ref 8.4–10.5)
Chloride: 106 mEq/L (ref 96–112)
Creatinine, Ser: 1.02 mg/dL (ref 0.40–1.50)
GFR: 74.54 mL/min (ref >60.00)
Glucose, Bld: 123 mg/dL — ABNORMAL HIGH (ref 70–99)
Potassium: 4.4 mEq/L (ref 3.5–5.1)
Sodium: 138 mEq/L (ref 135–145)

## 2023-06-11 LAB — LIPID PANEL
Cholesterol: 132 mg/dL (ref 0–200)
HDL: 44.2 mg/dL (ref >39.00)
LDL Cholesterol: 60 mg/dL (ref 0–99)
NonHDL: 88.13
Total CHOL/HDL Ratio: 3
Triglycerides: 140 mg/dL (ref 0.0–149.0)
VLDL: 28 mg/dL (ref 0.0–40.0)

## 2023-06-11 LAB — HEMOGLOBIN A1C: Hgb A1c MFr Bld: 7 % — ABNORMAL HIGH (ref 4.6–6.5)

## 2023-06-12 DIAGNOSIS — H903 Sensorineural hearing loss, bilateral: Secondary | ICD-10-CM | POA: Diagnosis not present

## 2023-06-13 ENCOUNTER — Ambulatory Visit: Payer: Medicare PPO | Admitting: Endocrinology

## 2023-06-13 ENCOUNTER — Encounter: Payer: Self-pay | Admitting: Endocrinology

## 2023-06-13 VITALS — BP 125/85 | HR 79 | Ht 69.0 in | Wt 168.8 lb

## 2023-06-13 DIAGNOSIS — Z7985 Long-term (current) use of injectable non-insulin antidiabetic drugs: Secondary | ICD-10-CM | POA: Diagnosis not present

## 2023-06-13 DIAGNOSIS — E1165 Type 2 diabetes mellitus with hyperglycemia: Secondary | ICD-10-CM | POA: Diagnosis not present

## 2023-06-13 DIAGNOSIS — E785 Hyperlipidemia, unspecified: Secondary | ICD-10-CM | POA: Diagnosis not present

## 2023-06-13 NOTE — Progress Notes (Signed)
Patient ID: Joseph Sharp, male   DOB: 04-Jan-1951, 72 y.o.   MRN: [id number]   Reason for Appointment: follow-up for diabetes and blood pressure   History of Present Illness    Diagnosis: Type 2 DIABETES MELITUS, diagnosed in 1977     PAST history: He has had long-standing diabetes managed with multiple drugs and usually well controlled His blood sugar control is somewhat dependent on his compliance with diet and exercise regimen Amaryl was stopped in 2014 because of tendency to hypoglycemia and Prandin was started  In 11 /14 his blood sugars were higher overall and A1c was 7% which was higher than usual Because of this he was started on Trulicity injections, 0.75 mg He did benefit overall from starting 0.75 mg Trulicity  RECENT history:  His A1c is 7 compared to 8.4 in 2/24 Fructosamine is 300 Previously lowest A1c 6.4  Last visit was 06/2022   Non-insulin hypoglycemic drugs: Metformin 2g, pioglitazone 30 mg, Ozempic 0.5 mg weekly  Current management, blood sugar patterns and problems identified: A1c was higher previously because of running out of Ozempic not taking his Amaryl  With restarting and continuing both of his medications his blood sugars overall are better but still mildly increased  As before his monitoring is very sporadic and done only in the last 3 days in preparation for his visit Lab glucose was 123 random He has not been exercising and does not walk because of the hot weather He is generally trying to watch his portions with consistent weight since his last visit Taking relatively small dose of 0.5 mg Ozempic, dialing halfway on the 1 mg pen to save cost No hypoglycemia with Amaryl   EXERCISE: With walking 0-2/7  days  Side effects from medications: None  Monitors blood glucose:  1+ times a day     Glucometer:          Blood Glucose readings by download of meter:   PRE-MEAL Fasting Lunch Dinner Bedtime Overall  Glucose range: 119, 129  155 121    Mean/median:     138   POST-MEAL PC Breakfast PC Lunch PC Dinner  Glucose range:  161 142-175  Mean/median:        PRE-MEAL Fasting Lunch Dinner Bedtime Overall  Glucose range: 151-192 99 108, 129    Mean/median:     145   POST-MEAL PC Breakfast PC Lunch PC Dinner  Glucose range: 130, 144  177, 174  Mean/median:       Dinner usually at 7 pm     Wt Readings from Last 3 Encounters:  06/13/23 168 lb 12.8 oz (76.6 kg)  03/27/23 170 lb 9.6 oz (77.4 kg)  01/11/23 166 lb 12.8 oz (75.7 kg)   Lab Results  Component Value Date   HGBA1C 7.0 (H) 06/11/2023   HGBA1C 8.4 (H) 01/09/2023   HGBA1C 6.9 (H) 07/04/2022   Lab Results  Component Value Date   MICROALBUR <0.7 01/09/2023   LDLCALC 60 06/11/2023   CREATININE 1.02 06/11/2023     Allergies as of 06/13/2023   No Known Allergies      Medication List        Accurate as of June 13, 2023 12:00 PM. If you have any questions, ask your nurse or doctor.          aspirin 81 MG chewable tablet 1 tablet Orally Once a day   B-12 PO Take by mouth.   fluticasone 50 MCG/ACT nasal spray Commonly known  as: FLONASE Place 2 sprays into both nostrils daily.   glimepiride 2 MG tablet Commonly known as: AMARYL Take 1 tablet (2 mg total) by mouth daily with supper.   glucose blood test strip 1 each by Other route as needed for other. Use as instructed to check blood sugar once daily.   lovastatin 20 MG tablet Commonly known as: MEVACOR TAKE 1 TABLET EVERY DAY WITH A MEAL orally once a day for 90 days   metFORMIN 500 MG 24 hr tablet Commonly known as: GLUCOPHAGE-XR TAKE 4 TABLETS BY MOUTH EVERY DAY   Ozempic (1 MG/DOSE) 4 MG/3ML Sopn Generic drug: Semaglutide (1 MG/DOSE) INJECT 1 MG INTO THE SKIN ONE TIME PER WEEK   pioglitazone 30 MG tablet Commonly known as: ACTOS TAKE 1 TABLET BY MOUTH EVERY DAY   pravastatin 20 MG tablet Commonly known as: PRAVACHOL Take 1 tablet (20 mg total) by mouth daily.    tadalafil 5 MG tablet Commonly known as: CIALIS Take 5 mg by mouth daily.   zinc gluconate 50 MG tablet Take by mouth.        Allergies: No Known Allergies  Past Medical History:  Diagnosis Date   Allergy    Diabetes mellitus without complication [facility])     Past Surgical History:  Procedure Laterality Date   EYE SURGERY  01/2013   Eyelid surgery    Family History  Problem Relation Age of Onset   Diabetes Mother    Arthritis Mother    Diabetes Brother    Cancer Neg Hx    Heart disease Neg Hx     Social History:  reports that he has never smoked. He has never used smokeless tobacco. He reports that he does not drink alcohol and does not use drugs.   Review of Systems    Blood pressure is usually normal, has not required blood pressure medications   BP Readings from Last 3 Encounters:  06/13/23 125/85  03/27/23 136/80  01/11/23 122/60   CREATININE level stable  Lab Results  Component Value Date   CREATININE 1.02 06/11/2023   CREATININE 1.07 03/22/2023   CREATININE 0.94 01/09/2023     HYPERLIPIDEMIA: The lipid abnormality consists of elevated LDL and triglycerides and low HDL LDL previously well controlled with lovastatin Has low HDL from metabolic syndrome and history of high triglycerides  He had stopped lovastatin in 2020 and again after a trial subsequently because of leg pain  His triglycerides have been consistently normal  Back to taking pravastatin as recommended on the last visit when he had ran out of refills  LDL consistently under 100   Lab Results  Component Value Date   CHOL 132 06/11/2023   CHOL 143 03/22/2023   CHOL 119 07/04/2022   Lab Results  Component Value Date   HDL 44.20 06/11/2023   HDL 40.40 03/22/2023   HDL 40.70 07/04/2022   Lab Results  Component Value Date   LDLCALC 60 06/11/2023   LDLCALC 84 03/22/2023   LDLCALC 59 07/04/2022   Lab Results  Component Value Date   TRIG 140.0 06/11/2023   TRIG 97.0  03/22/2023   TRIG 94.0 07/04/2022   Lab Results  Component Value Date   CHOLHDL 3 06/11/2023   CHOLHDL 4 03/22/2023   CHOLHDL 3 07/04/2022   Lab Results  Component Value Date   LDLDIRECT 95.0 04/18/2019     He takes vitamin D 1000 units OTC as recommended for supplementation with normal levels  Lab Results  Component  Value Date   VD25OH 66.01 05/26/2021   VD25OH 54.76 03/29/2020     No complaints of numbness in the feet     Examination:   BP 125/85   Pulse 79   Ht 5\' 9"  (1.753 m)   Wt 168 lb 12.8 oz (76.6 kg)   SpO2 96%   BMI 24.93 kg/m   Body mass index is 24.93 kg/m.     ASSESSMENT/ PLAN:    DIABETES, non-insulin-dependent:   See history of present illness for detailed discussion of current diabetes management, blood sugar patterns and problems identified  He is on 4 drug regimen including Ozempic  His A1c is significantly higher as of February but fructosamine is 300 now  With restarting Amaryl and taking Ozempic consistently his fasting readings are improved and his highest reading recently is 175 However as before he monitors only for 3 days prior to his office visit With taking Ozempic regularly weight is down 2 pounds  No changes in his medication regimen Encouraged him to start walking in the morning regularly  Continue pravastatin and discussed the need to continue this consistently for cardiovascular risk reduction, tolerating this well  Follow-up in 4 months  There are no Patient Instructions on file for this visit.   Reather Littler 06/13/2023, 12:00 PM   Note: This office note was prepared with Dragon voice recognition system technology. Any transcriptional errors that result from this process are unintentional.

## 2023-06-18 DIAGNOSIS — N486 Induration penis plastica: Secondary | ICD-10-CM | POA: Diagnosis not present

## 2023-06-18 DIAGNOSIS — N5201 Erectile dysfunction due to arterial insufficiency: Secondary | ICD-10-CM | POA: Diagnosis not present

## 2023-06-20 ENCOUNTER — Encounter: Payer: Self-pay | Admitting: Endocrinology

## 2023-06-20 DIAGNOSIS — E119 Type 2 diabetes mellitus without complications: Secondary | ICD-10-CM | POA: Diagnosis not present

## 2023-06-20 DIAGNOSIS — H21233 Degeneration of iris (pigmentary), bilateral: Secondary | ICD-10-CM | POA: Diagnosis not present

## 2023-06-20 DIAGNOSIS — H25813 Combined forms of age-related cataract, bilateral: Secondary | ICD-10-CM | POA: Diagnosis not present

## 2023-06-20 DIAGNOSIS — H40013 Open angle with borderline findings, low risk, bilateral: Secondary | ICD-10-CM | POA: Diagnosis not present

## 2023-06-20 LAB — HM DIABETES EYE EXAM

## 2023-06-25 DIAGNOSIS — M5451 Vertebrogenic low back pain: Secondary | ICD-10-CM | POA: Diagnosis not present

## 2023-06-27 ENCOUNTER — Other Ambulatory Visit: Payer: Medicare PPO

## 2023-06-29 ENCOUNTER — Ambulatory Visit: Payer: Medicare PPO | Admitting: Endocrinology

## 2023-08-07 DIAGNOSIS — Z85828 Personal history of other malignant neoplasm of skin: Secondary | ICD-10-CM | POA: Diagnosis not present

## 2023-08-07 DIAGNOSIS — L814 Other melanin hyperpigmentation: Secondary | ICD-10-CM | POA: Diagnosis not present

## 2023-08-07 DIAGNOSIS — L821 Other seborrheic keratosis: Secondary | ICD-10-CM | POA: Diagnosis not present

## 2023-08-07 DIAGNOSIS — D2261 Melanocytic nevi of right upper limb, including shoulder: Secondary | ICD-10-CM | POA: Diagnosis not present

## 2023-08-07 DIAGNOSIS — L570 Actinic keratosis: Secondary | ICD-10-CM | POA: Diagnosis not present

## 2023-10-19 ENCOUNTER — Other Ambulatory Visit: Payer: Self-pay

## 2023-10-19 ENCOUNTER — Other Ambulatory Visit: Payer: Medicare PPO

## 2023-10-19 DIAGNOSIS — E1165 Type 2 diabetes mellitus with hyperglycemia: Secondary | ICD-10-CM | POA: Diagnosis not present

## 2023-10-20 LAB — BASIC METABOLIC PANEL
BUN: 23 mg/dL (ref 7–25)
CO2: 24 mmol/L (ref 20–32)
Calcium: 9.9 mg/dL (ref 8.6–10.3)
Chloride: 106 mmol/L (ref 98–110)
Creat: 1.04 mg/dL (ref 0.70–1.28)
Glucose, Bld: 119 mg/dL — ABNORMAL HIGH (ref 65–99)
Potassium: 4.5 mmol/L (ref 3.5–5.3)
Sodium: 140 mmol/L (ref 135–146)

## 2023-10-20 LAB — HEMOGLOBIN A1C
Hgb A1c MFr Bld: 7.3 %{Hb} — ABNORMAL HIGH (ref <5.7)
Mean Plasma Glucose: 163 mg/dL
eAG (mmol/L): 9 mmol/L

## 2023-10-24 ENCOUNTER — Encounter: Payer: Self-pay | Admitting: Endocrinology

## 2023-10-24 ENCOUNTER — Ambulatory Visit: Payer: Medicare PPO | Admitting: Endocrinology

## 2023-10-24 DIAGNOSIS — E1165 Type 2 diabetes mellitus with hyperglycemia: Secondary | ICD-10-CM

## 2023-10-24 DIAGNOSIS — Z7985 Long-term (current) use of injectable non-insulin antidiabetic drugs: Secondary | ICD-10-CM | POA: Diagnosis not present

## 2023-10-24 MED ORDER — GLIMEPIRIDE 2 MG PO TABS: 2.0000 mg | ORAL_TABLET | Freq: Every day | ORAL | 3 refills | Status: DC

## 2023-10-24 MED ORDER — PIOGLITAZONE HCL 30 MG PO TABS: 30.0000 mg | ORAL_TABLET | Freq: Every day | ORAL | 3 refills | Status: DC

## 2023-10-24 MED ORDER — PRAVASTATIN SODIUM 20 MG PO TABS: 20.0000 mg | ORAL_TABLET | Freq: Every day | ORAL | 3 refills | Status: DC

## 2023-10-24 MED ORDER — METFORMIN HCL ER 500 MG PO TB24: 2000.0000 mg | ORAL_TABLET | Freq: Every day | ORAL | 3 refills | Status: DC

## 2023-10-24 MED ORDER — OZEMPIC (1 MG/DOSE) 4 MG/3ML ~~LOC~~ SOPN
PEN_INJECTOR | SUBCUTANEOUS | 3 refills | Status: DC
Start: 2023-10-24 — End: 2024-09-17

## 2023-10-24 NOTE — Progress Notes (Signed)
Outpatient Endocrinology Note Joseph Gregory Barrick, MD  10/24/23  Patient's Name: Joseph Sharp    DOB: 11-18-1950    MRN: [id number]                                                    REASON OF VISIT: Follow up for type 2 diabetes mellitus  PCP: Shade Flood, MD  HISTORY OF PRESENT ILLNESS:   Joseph Sharp is a 72 y.o. old male with past medical history listed below, is here for follow up for type 2 diabetes mellitus.   Pertinent Diabetes History: Patient was previously seen by Dr. Lucianne Muss and was last time seen in July 2024.  Patient was diagnosed with type 2 diabetes mellitus in 1977.  He has usually well-controlled type 2 diabetes mellitus.  Chronic Diabetes Complications : Retinopathy: no. Last ophthalmology exam was done on 06/2023, following with ophthalmology regularly.  Nephropathy: no Peripheral neuropathy: no Coronary artery disease: no Stroke: no  Relevant comorbidities and cardiovascular risk factors: Obesity: no Body mass index is 24.63 kg/m.  Hypertension: no Hyperlipidemia : Yes, on statin   Current / Home Diabetic regimen includes:  Metformin XR 2000 mg daily.   Pioglitazone 30 mg daily.   Ozempic 0.5 mg weekly. Glimepiride/Amaryl 2 mg at bedtime.  Prior diabetic medications: Prandin, Trulicity in the past.  Reports history of hypoglycemia with higher dose of Amaryl 4 mg in the past.  Glycemic data:   One Touch Verio reflect glucometer.  Download from November 27 to October 24, 2023 reviewed.  Average blood sugar 157.  Highest blood sugar 256 and lowest 89.  He has been checking 2-3 times a day.  Most of the fasting blood sugar in the range of 140-160 range.  Blood sugar in the afternoon and evening are mostly acceptable 89-130 range.  Occasionally high blood sugar in the afternoon 184, 169 149, 230.  No hypoglycemia.  Hypoglycemia: Patient has no hypoglycemic episodes. Patient has hypoglycemia awareness.  Factors modifying glucose control: 1.   Diabetic diet assessment: 3 meals a day.  He has been eating healthy diet.  2.  Staying active or exercising: Walking.  3.  Medication compliance: compliant all of the time.  Interval history  Patient lab results reviewed.  Hemoglobin A1c slightly increased to 7.3%.  Renal function and electrolytes are normal.  He denies any complaints today.  He ran out of the pravastatin and not able to refill.  Diabetes regimen as noted above.  Glucometer data as reviewed above.  REVIEW OF SYSTEMS As per history of present illness.   PAST MEDICAL HISTORY: Past Medical History:  Diagnosis Date   Allergy    Diabetes mellitus without complication (HCC)     PAST SURGICAL HISTORY: Past Surgical History:  Procedure Laterality Date   EYE SURGERY  01/2013   Eyelid surgery    ALLERGIES: No Known Allergies  FAMILY HISTORY:  Family History  Problem Relation Age of Onset   Diabetes Mother    Arthritis Mother    Diabetes Brother    Cancer Neg Hx    Heart disease Neg Hx     SOCIAL HISTORY: Social History   Socioeconomic History   Marital status: Married    Spouse name: Misty Stanley   Number of children: 1   Years of education: Not on file   Highest  education level: Not on file  Occupational History   Occupation: Engineering    Comment: [employer]  Tobacco Use   Smoking status: Never   Smokeless tobacco: Never  Substance and Sexual Activity   Alcohol use: No   Drug use: No   Sexual activity: Yes  Other Topics Concern   Not on file  Social History Narrative   Lives with his wife.  Their daughter lives in [place], [place].   Social Determinants of Health   Financial Resource Strain: Low Risk  (04/18/2023)   Overall Financial Resource Strain (CARDIA)    Difficulty of Paying Living Expenses: Not hard at all  Food Insecurity: No Food Insecurity (04/18/2023)   Hunger Vital Sign    Worried About Running Out of Food in the Last Year: Never true    Ran Out of Food in the Last Year:  Never true  Transportation Needs: No Transportation Needs (04/18/2023)   PRAPARE - Administrator, Civil Service (Medical): No    Lack of Transportation (Non-Medical): No  Physical Activity: Inactive (04/18/2023)   Exercise Vital Sign    Days of Exercise per Week: 0 days    Minutes of Exercise per Session: 0 min  Stress: No Stress Concern Present (02/28/2022)   [employer] of Occupational Health - Occupational Stress Questionnaire    Feeling of Stress : Not at all  Social Connections: Moderately Integrated (04/18/2023)   Social Connection and Isolation Panel [NHANES]    Frequency of Communication with Friends and Family: More than three times a week    Frequency of Social Gatherings with Friends and Family: Never    Attends Religious Services: More than 4 times per year    Active Member of [employer] or Organizations: No    Attends Banker Meetings: Never    Marital Status: Married    MEDICATIONS:  Current Outpatient Medications  Medication Sig Dispense Refill   Cyanocobalamin (B-12 PO) Take by mouth.     fluticasone (FLONASE) 50 MCG/ACT nasal spray Place 2 sprays into both nostrils daily. 16 g 0   glucose blood test strip 1 each by Other route as needed for other. Use as instructed to check blood sugar once daily. 100 each 3   tadalafil (CIALIS) 5 MG tablet Take 5 mg by mouth daily.     zinc gluconate 50 MG tablet Take by mouth.     aspirin 81 MG chewable tablet 1 tablet Orally Once a day     glimepiride (AMARYL) 2 MG tablet Take 1 tablet (2 mg total) by mouth daily with supper. 90 tablet 3   metFORMIN (GLUCOPHAGE-XR) 500 MG 24 hr tablet Take 4 tablets (2,000 mg total) by mouth daily. 360 tablet 3   pioglitazone (ACTOS) 30 MG tablet Take 1 tablet (30 mg total) by mouth daily. 90 tablet 3   pravastatin (PRAVACHOL) 20 MG tablet Take 1 tablet (20 mg total) by mouth daily. 90 tablet 3   Semaglutide, 1 MG/DOSE, (OZEMPIC, 1 MG/DOSE,) 4 MG/3ML SOPN INJECT 1 MG INTO THE  SKIN ONE TIME PER WEEK 9 mL 3   No current facility-administered medications for this visit.    PHYSICAL EXAM: Vitals:   10/24/23 0826  BP: 130/60  Pulse: 81  Resp: 20  SpO2: 97%  Weight: 166 lb 12.8 oz (75.7 kg)  Height: 5\' 9"  (1.753 m)   Body mass index is 24.63 kg/m.  Wt Readings from Last 3 Encounters:  10/24/23 166 lb 12.8 oz (75.7 kg)  06/13/23 168 lb 12.8 oz (76.6 kg)  03/27/23 170 lb 9.6 oz (77.4 kg)    General: Well developed, well nourished male in no apparent distress.  HEENT: AT/, no external lesions.  Eyes: Conjunctiva clear and no icterus. Neck: Neck supple  Lungs: Respirations not labored Neurologic: Alert, oriented, normal speech Extremities / Skin: Dry. No sores or rashes noted.  Psychiatric: Does not appear depressed or anxious  Diabetic Foot Exam - Simple   No data filed    LABS Reviewed Lab Results  Component Value Date   HGBA1C 7.3 (H) 10/19/2023   HGBA1C 7.0 (H) 06/11/2023   HGBA1C 8.4 (H) 01/09/2023   Lab Results  Component Value Date   FRUCTOSAMINE 300 (H) 03/22/2023   FRUCTOSAMINE 265 11/11/2013   Lab Results  Component Value Date   CHOL 132 06/11/2023   HDL 44.20 06/11/2023   LDLCALC 60 06/11/2023   LDLDIRECT 95.0 04/18/2019   TRIG 140.0 06/11/2023   CHOLHDL 3 06/11/2023   Lab Results  Component Value Date   MICRALBCREAT 0.6 01/09/2023   MICRALBCREAT 0.6 01/03/2022   Lab Results  Component Value Date   CREATININE 1.04 10/19/2023   Lab Results  Component Value Date   GFR 74.54 06/11/2023    ASSESSMENT / PLAN  1. Uncontrolled type 2 diabetes mellitus with hyperglycemia, without long-term current use of insulin (HCC)     Diabetes Mellitus type 2, complicated by no known complications. - Diabetic status / severity: Fair control.  Lab Results  Component Value Date   HGBA1C 7.3 (H) 10/19/2023    - Hemoglobin A1c goal : <7%  Hemoglobin A1c elevated to 7.3%.  Recently on glucometer data occasional hyperglycemia  in the early morning and in the afternoon.  Blood sugar as low as 89 as well.  Will increase dose of Ozempic.  - Medications: See below.  I) continue metformin XR 2000 mg daily.   II) continue pioglitazone/Actos 30 mg daily. III) increase Ozempic from 0.5 to 1 mg weekly. IV) continue Amaryl 2 mg daily.  Patient has been taking Amaryl at bedtime, due to morning hyperglycemia.  Discussed to consider taking it with supper.  - Home glucose testing: At least in the morning fasting and at bedtime. - Discussed/ Gave Hypoglycemia treatment plan.  # Consult : not required at this time.   # Annual urine for microalbuminuria/ creatinine ratio, no microalbuminuria currently. Last  Lab Results  Component Value Date   MICRALBCREAT 0.6 01/09/2023    # Foot check nightly.  # Annual dilated diabetic eye exams.   - Diet: Make healthy diabetic food choices - Life style / activity / exercise: Discussed.  2. Blood pressure  -  BP Readings from Last 1 Encounters:  10/24/23 130/60    - Control is in target.  - No change in current plans.  3. Lipid status / Hyperlipidemia - Last  Lab Results  Component Value Date   LDLCALC 60 06/11/2023   - Continue pravastatin 20 mg daily.  Reordered.  Diagnoses and all orders for this visit:  Uncontrolled type 2 diabetes mellitus with hyperglycemia, without long-term current use of insulin (HCC) -     Semaglutide, 1 MG/DOSE, (OZEMPIC, 1 MG/DOSE,) 4 MG/3ML SOPN; INJECT 1 MG INTO THE SKIN ONE TIME PER WEEK -     Basic metabolic panel -     Hemoglobin A1c -     Microalbumin / creatinine urine ratio  Other orders -     glimepiride (AMARYL) 2 MG tablet;  Take 1 tablet (2 mg total) by mouth daily with supper. -     metFORMIN (GLUCOPHAGE-XR) 500 MG 24 hr tablet; Take 4 tablets (2,000 mg total) by mouth daily. -     pioglitazone (ACTOS) 30 MG tablet; Take 1 tablet (30 mg total) by mouth daily. -     pravastatin (PRAVACHOL) 20 MG tablet; Take 1 tablet (20  mg total) by mouth daily.    DISPOSITION Follow up in clinic in 3  months suggested.   All questions answered and patient verbalized understanding of the plan.  Joseph Sidonie Dexheimer, MD [facility] Endocrinology [facility] [address] [place], [address] [place], [place] [address] Phone # [phone]  At least part of this note was generated using voice recognition software. Inadvertent word errors may have occurred, which were not recognized during the proofreading process.

## 2023-10-24 NOTE — Patient Instructions (Signed)
Increase ozempic to 1mg  weekly, rest medications same.

## 2023-11-19 DIAGNOSIS — E119 Type 2 diabetes mellitus without complications: Secondary | ICD-10-CM | POA: Diagnosis not present

## 2023-11-19 DIAGNOSIS — H25043 Posterior subcapsular polar age-related cataract, bilateral: Secondary | ICD-10-CM | POA: Diagnosis not present

## 2023-11-19 DIAGNOSIS — H18413 Arcus senilis, bilateral: Secondary | ICD-10-CM | POA: Diagnosis not present

## 2023-11-19 DIAGNOSIS — H40013 Open angle with borderline findings, low risk, bilateral: Secondary | ICD-10-CM | POA: Diagnosis not present

## 2023-11-19 DIAGNOSIS — H2512 Age-related nuclear cataract, left eye: Secondary | ICD-10-CM | POA: Diagnosis not present

## 2023-11-19 DIAGNOSIS — H2513 Age-related nuclear cataract, bilateral: Secondary | ICD-10-CM | POA: Diagnosis not present

## 2024-01-01 DIAGNOSIS — E1136 Type 2 diabetes mellitus with diabetic cataract: Secondary | ICD-10-CM | POA: Diagnosis not present

## 2024-01-01 DIAGNOSIS — H2511 Age-related nuclear cataract, right eye: Secondary | ICD-10-CM | POA: Diagnosis not present

## 2024-01-01 DIAGNOSIS — H25041 Posterior subcapsular polar age-related cataract, right eye: Secondary | ICD-10-CM | POA: Diagnosis not present

## 2024-01-01 DIAGNOSIS — E785 Hyperlipidemia, unspecified: Secondary | ICD-10-CM | POA: Diagnosis not present

## 2024-01-01 DIAGNOSIS — H2512 Age-related nuclear cataract, left eye: Secondary | ICD-10-CM | POA: Diagnosis not present

## 2024-01-08 DIAGNOSIS — E785 Hyperlipidemia, unspecified: Secondary | ICD-10-CM | POA: Diagnosis not present

## 2024-01-08 DIAGNOSIS — E1136 Type 2 diabetes mellitus with diabetic cataract: Secondary | ICD-10-CM | POA: Diagnosis not present

## 2024-01-08 DIAGNOSIS — H25041 Posterior subcapsular polar age-related cataract, right eye: Secondary | ICD-10-CM | POA: Diagnosis not present

## 2024-01-08 DIAGNOSIS — H2511 Age-related nuclear cataract, right eye: Secondary | ICD-10-CM | POA: Diagnosis not present

## 2024-01-12 HISTORY — PX: CATARACT EXTRACTION: SUR2

## 2024-02-07 ENCOUNTER — Other Ambulatory Visit: Payer: Medicare PPO

## 2024-02-07 DIAGNOSIS — E1165 Type 2 diabetes mellitus with hyperglycemia: Secondary | ICD-10-CM | POA: Diagnosis not present

## 2024-02-08 ENCOUNTER — Encounter: Payer: Self-pay | Admitting: Endocrinology

## 2024-02-08 LAB — MICROALBUMIN / CREATININE URINE RATIO
Creatinine, Urine: 218 mg/dL (ref 20–320)
Microalb Creat Ratio: 3 mg/g{creat} (ref <30)
Microalb, Ur: 0.7 mg/dL

## 2024-02-08 LAB — BASIC METABOLIC PANEL WITH GFR
BUN: 24 mg/dL (ref 7–25)
CO2: 22 mmol/L (ref 20–32)
Calcium: 9.5 mg/dL (ref 8.6–10.3)
Chloride: 107 mmol/L (ref 98–110)
Creat: 1.04 mg/dL (ref 0.70–1.28)
Glucose, Bld: 97 mg/dL (ref 65–99)
Potassium: 4.6 mmol/L (ref 3.5–5.3)
Sodium: 141 mmol/L (ref 135–146)
eGFR: 77 mL/min/{1.73_m2} (ref >=60)

## 2024-02-08 LAB — HEMOGLOBIN A1C
Hgb A1c MFr Bld: 6.8 %{Hb} — ABNORMAL HIGH (ref <5.7)
Mean Plasma Glucose: 148 mg/dL
eAG (mmol/L): 8.2 mmol/L

## 2024-02-12 ENCOUNTER — Encounter: Payer: Self-pay | Admitting: Endocrinology

## 2024-02-12 ENCOUNTER — Ambulatory Visit: Payer: Medicare PPO | Admitting: Endocrinology

## 2024-02-12 VITALS — BP 114/64 | HR 78 | Resp 16 | Ht 69.0 in | Wt 163.4 lb

## 2024-02-12 DIAGNOSIS — Z7984 Long term (current) use of oral hypoglycemic drugs: Secondary | ICD-10-CM | POA: Diagnosis not present

## 2024-02-12 DIAGNOSIS — E785 Hyperlipidemia, unspecified: Secondary | ICD-10-CM

## 2024-02-12 DIAGNOSIS — E1165 Type 2 diabetes mellitus with hyperglycemia: Secondary | ICD-10-CM

## 2024-02-12 DIAGNOSIS — Z7985 Long-term (current) use of injectable non-insulin antidiabetic drugs: Secondary | ICD-10-CM

## 2024-02-12 NOTE — Progress Notes (Signed)
 Outpatient Endocrinology Note Joseph Olean Sangster, MD  02/12/24  Patient's Name: Joseph Sharp    DOB: September 28, 1951    MRN: [id number]                                                    REASON OF VISIT: Follow up for type 2 diabetes mellitus  PCP: Shade Flood, MD  HISTORY OF PRESENT ILLNESS:   Joseph Sharp is a 73 y.o. old male with past medical history listed below, is here for follow up for type 2 diabetes mellitus.   Pertinent Diabetes History: Patient was previously seen by Dr. Lucianne Muss and was last time seen in July 2024.  Patient was diagnosed with type 2 diabetes mellitus in 1977.  He has usually well-controlled type 2 diabetes mellitus.  Chronic Diabetes Complications : Retinopathy: no. Last ophthalmology exam was done on 06/2023, following with ophthalmology regularly.  Nephropathy: no Peripheral neuropathy: no Coronary artery disease: no Stroke: no  Relevant comorbidities and cardiovascular risk factors: Obesity: no Body mass index is 24.13 kg/m.  Hypertension: no Hyperlipidemia : Yes, on statin   Current / Home Diabetic regimen includes:  Metformin XR 2000 mg daily.   Pioglitazone 30 mg daily.   Ozempic 1 mg weekly. Glimepiride/Amaryl 2 mg at bedtime.  Prior diabetic medications: Prandin, Trulicity in the past.  Reports history of hypoglycemia with higher dose of Amaryl 4 mg in the past.  He tends to lose significant weight  when taking Ozempic 1 mg weekly in the past.  Glycemic data:   One Touch Verio reflect glucometer.  March 18 to February 12, 2024 reviewed.  Average blood sugar 127.  Highest blood sugar 179 and lowest 98.  He has been checking 2-3 times a day at different times of the day.  Fasting blood sugar 110, 145, 98.  Blood sugar in the afternoon 125, 105, 134, 131, blood sugar at bedtime 179.  Hypoglycemia: Patient has no hypoglycemic episodes. Patient has hypoglycemia awareness.  Factors modifying glucose control: 1.  Diabetic diet  assessment: 3 meals a day.  He has been eating healthy diet.  2.  Staying active or exercising: Walking.  3.  Medication compliance: compliant all of the time.  Interval history  Glucometer data as reviewed above.  Mostly acceptable blood sugar.  Diabetes regimen as reviewed and noted above.  He has been taking Ozempic 1 mg weekly and denies GI issues and tolerating well.  He lost about 3 pounds of weight in last 3 months.  Hemoglobin A1c improved to 6.8%.  Recent laboratory results reviewed.  No other complaints today.  REVIEW OF SYSTEMS As per history of present illness.   PAST MEDICAL HISTORY: Past Medical History:  Diagnosis Date   Allergy    Diabetes mellitus without complication (HCC)     PAST SURGICAL HISTORY: Past Surgical History:  Procedure Laterality Date   CATARACT EXTRACTION Bilateral 01/2024   EYE SURGERY  01/11/2013   Eyelid surgery    ALLERGIES: No Known Allergies  FAMILY HISTORY:  Family History  Problem Relation Age of Onset   Diabetes Mother    Arthritis Mother    Diabetes Brother    Cancer Neg Hx    Heart disease Neg Hx     SOCIAL HISTORY: Social History   Socioeconomic History   Marital status: Married  Spouse name: Misty Stanley   Number of children: 1   Years of education: Not on file   Highest education level: Not on file  Occupational History   Occupation: Engineering    Comment: [employer]  Tobacco Use   Smoking status: Never   Smokeless tobacco: Never  Substance and Sexual Activity   Alcohol use: No   Drug use: No   Sexual activity: Yes  Other Topics Concern   Not on file  Social History Narrative   Lives with his wife.  Their daughter lives in [place], [place].   Social Drivers of Corporate investment banker Strain: Low Risk  (04/18/2023)   Overall Financial Resource Strain (CARDIA)    Difficulty of Paying Living Expenses: Not hard at all  Food Insecurity: No Food Insecurity (04/18/2023)   Hunger Vital Sign    Worried  About Running Out of Food in the Last Year: Never true    Ran Out of Food in the Last Year: Never true  Transportation Needs: No Transportation Needs (04/18/2023)   PRAPARE - Administrator, Civil Service (Medical): No    Lack of Transportation (Non-Medical): No  Physical Activity: Inactive (04/18/2023)   Exercise Vital Sign    Days of Exercise per Week: 0 days    Minutes of Exercise per Session: 0 min  Stress: No Stress Concern Present (02/28/2022)   [employer] of Occupational Health - Occupational Stress Questionnaire    Feeling of Stress : Not at all  Social Connections: Moderately Integrated (04/18/2023)   Social Connection and Isolation Panel [NHANES]    Frequency of Communication with Friends and Family: More than three times a week    Frequency of Social Gatherings with Friends and Family: Never    Attends Religious Services: More than 4 times per year    Active Member of [employer] or Organizations: No    Attends Banker Meetings: Never    Marital Status: Married    MEDICATIONS:  Current Outpatient Medications  Medication Sig Dispense Refill   Cyanocobalamin (B-12 PO) Take by mouth.     fluticasone (FLONASE) 50 MCG/ACT nasal spray Place 2 sprays into both nostrils daily. 16 g 0   glimepiride (AMARYL) 2 MG tablet Take 1 tablet (2 mg total) by mouth daily with supper. 90 tablet 3   glucose blood test strip 1 each by Other route as needed for other. Use as instructed to check blood sugar once daily. 100 each 3   metFORMIN (GLUCOPHAGE-XR) 500 MG 24 hr tablet Take 4 tablets (2,000 mg total) by mouth daily. 360 tablet 3   pioglitazone (ACTOS) 30 MG tablet Take 1 tablet (30 mg total) by mouth daily. 90 tablet 3   pravastatin (PRAVACHOL) 20 MG tablet Take 1 tablet (20 mg total) by mouth daily. 90 tablet 3   Semaglutide, 1 MG/DOSE, (OZEMPIC, 1 MG/DOSE,) 4 MG/3ML SOPN INJECT 1 MG INTO THE SKIN ONE TIME PER WEEK 9 mL 3   tadalafil (CIALIS) 5 MG tablet Take 5 mg by  mouth daily.     zinc gluconate 50 MG tablet Take by mouth.     No current facility-administered medications for this visit.    PHYSICAL EXAM: Vitals:   02/12/24 0939  BP: 114/64  Pulse: 78  Resp: 16  SpO2: 98%  Weight: 163 lb 6.4 oz (74.1 kg)  Height: 5\' 9"  (1.753 m)    Body mass index is 24.13 kg/m.  Wt Readings from Last 3 Encounters:  02/12/24  163 lb 6.4 oz (74.1 kg)  10/24/23 166 lb 12.8 oz (75.7 kg)  06/13/23 168 lb 12.8 oz (76.6 kg)    General: Well developed, well nourished male in no apparent distress.  HEENT: AT/[place], no external lesions.  Eyes: Conjunctiva clear and no icterus. Neck: Neck supple  Lungs: Respirations not labored Neurologic: Alert, oriented, normal speech Extremities / Skin: Dry. No sores or rashes noted.  Psychiatric: Does not appear depressed or anxious  Diabetic Foot Exam - Simple   No data filed    LABS Reviewed Lab Results  Component Value Date   HGBA1C 6.8 (H) 02/07/2024   HGBA1C 7.3 (H) 10/19/2023   HGBA1C 7.0 (H) 06/11/2023   Lab Results  Component Value Date   FRUCTOSAMINE 300 (H) 03/22/2023   FRUCTOSAMINE 265 11/11/2013   Lab Results  Component Value Date   CHOL 132 06/11/2023   HDL 44.20 06/11/2023   LDLCALC 60 06/11/2023   LDLDIRECT 95.0 04/18/2019   TRIG 140.0 06/11/2023   CHOLHDL 3 06/11/2023   Lab Results  Component Value Date   MICRALBCREAT 3 02/07/2024   MICRALBCREAT 0.6 01/09/2023   Lab Results  Component Value Date   CREATININE 1.04 02/07/2024   Lab Results  Component Value Date   GFR 74.54 06/11/2023    ASSESSMENT / PLAN  1. Uncontrolled type 2 diabetes mellitus with hyperglycemia, without long-term current use of insulin (HCC)   2. Dyslipidemia, goal LDL below 100      Diabetes Mellitus type 2, complicated by no known complications. - Diabetic status / severity: Fair control.  Lab Results  Component Value Date   HGBA1C 6.8 (H) 02/07/2024    - Hemoglobin A1c goal : <6.5%  Improvement  on diabetes control.  He is still taking glimepiride at bedtime, advised to takewith supper time to avoid potential hypoglycemia when not eating.  Patient prefers not to take higher dose of Ozempic, does not want to lose any more weight.  - Medications: See below.  I) continue metformin XR 2000 mg daily.   II) continue pioglitazone/Actos 30 mg daily. III) continue Ozempic 1 mg weekly. IV) continue Amaryl 2 mg daily.  Patient has been taking Amaryl at bedtime, due to morning hyperglycemia.  Advised to take with supper.  - Home glucose testing: At least in the morning fasting and at bedtime. - Discussed/ Gave Hypoglycemia treatment plan.  # Consult : not required at this time.   # Annual urine for microalbuminuria/ creatinine ratio, no microalbuminuria currently. Last  Lab Results  Component Value Date   MICRALBCREAT 3 02/07/2024    # Foot check nightly.  # Annual dilated diabetic eye exams.   - Diet: Make healthy diabetic food choices - Life style / activity / exercise: Discussed.  2. Blood pressure  -  BP Readings from Last 1 Encounters:  02/12/24 114/64    - Control is in target.  - No change in current plans.  3. Lipid status / Hyperlipidemia - Last  Lab Results  Component Value Date   LDLCALC 60 06/11/2023   - Continue pravastatin 20 mg daily.    Diagnoses and all orders for this visit:  Uncontrolled type 2 diabetes mellitus with hyperglycemia, without long-term current use of insulin (HCC) -     Lipid panel -     Basic Metabolic Panel Without GFR -     Hemoglobin A1c  Dyslipidemia, goal LDL below 100     DISPOSITION Follow up in clinic in 4  months suggested.  Labs prior to follow-up visit as ordered.   All questions answered and patient verbalized understanding of the plan.  Joseph Delmore Sear, MD [facility] [address] [place], [address] [place], [place] [address] Phone # [phone]  At least part of this note was  generated using voice recognition software. Inadvertent word errors may have occurred, which were not recognized during the proofreading process.

## 2024-06-10 ENCOUNTER — Encounter: Payer: Self-pay | Admitting: Family Medicine

## 2024-06-13 ENCOUNTER — Other Ambulatory Visit

## 2024-06-13 DIAGNOSIS — E1165 Type 2 diabetes mellitus with hyperglycemia: Secondary | ICD-10-CM | POA: Diagnosis not present

## 2024-06-14 LAB — LIPID PANEL
Cholesterol: 153 mg/dL (ref <200)
HDL: 42 mg/dL (ref >=40)
LDL Cholesterol (Calc): 84 mg/dL
Non-HDL Cholesterol (Calc): 111 mg/dL (ref <130)
Total CHOL/HDL Ratio: 3.6 (calc) (ref <5.0)
Triglycerides: 176 mg/dL — ABNORMAL HIGH (ref <150)

## 2024-06-14 LAB — HEMOGLOBIN A1C
Hgb A1c MFr Bld: 7.5 % — ABNORMAL HIGH (ref <5.7)
Mean Plasma Glucose: 169 mg/dL
eAG (mmol/L): 9.3 mmol/L

## 2024-06-14 LAB — BASIC METABOLIC PANEL WITHOUT GFR
BUN: 19 mg/dL (ref 7–25)
CO2: 27 mmol/L (ref 20–32)
Calcium: 10 mg/dL (ref 8.6–10.3)
Chloride: 107 mmol/L (ref 98–110)
Creat: 1.04 mg/dL (ref 0.70–1.28)
Glucose, Bld: 145 mg/dL — ABNORMAL HIGH (ref 65–99)
Potassium: 4.6 mmol/L (ref 3.5–5.3)
Sodium: 141 mmol/L (ref 135–146)

## 2024-06-16 ENCOUNTER — Ambulatory Visit: Payer: Self-pay | Admitting: Endocrinology

## 2024-06-17 ENCOUNTER — Ambulatory Visit: Admitting: Endocrinology

## 2024-06-17 ENCOUNTER — Encounter: Payer: Self-pay | Admitting: Endocrinology

## 2024-06-17 VITALS — BP 138/74 | HR 73 | Resp 20 | Ht 69.0 in | Wt 164.2 lb

## 2024-06-17 DIAGNOSIS — E785 Hyperlipidemia, unspecified: Secondary | ICD-10-CM

## 2024-06-17 DIAGNOSIS — Z7985 Long-term (current) use of injectable non-insulin antidiabetic drugs: Secondary | ICD-10-CM | POA: Diagnosis not present

## 2024-06-17 DIAGNOSIS — Z7984 Long term (current) use of oral hypoglycemic drugs: Secondary | ICD-10-CM

## 2024-06-17 DIAGNOSIS — E1165 Type 2 diabetes mellitus with hyperglycemia: Secondary | ICD-10-CM

## 2024-06-17 NOTE — Progress Notes (Signed)
 Outpatient Endocrinology Note Joseph Denzal Meir, MD  06/17/24  Patient's Name: Joseph Sharp    DOB: 1951/04/28    MRN: [id number]                                                    REASON OF VISIT: Follow up for type 2 diabetes mellitus  PCP: Levora Reyes SAUNDERS, MD  HISTORY OF PRESENT ILLNESS:   Joseph Sharp is a 73 y.o. old male with past medical history listed below, is here for follow up for type 2 diabetes mellitus.   Pertinent Diabetes History: Patient was previously seen by Dr. Von and was last time seen in July 2024.  Patient was diagnosed with type 2 diabetes mellitus in 1977.  He has usually well-controlled type 2 diabetes mellitus.  Chronic Diabetes Complications : Retinopathy: no. Last ophthalmology exam was done on 06/2023, following with ophthalmology regularly.  Nephropathy: no Peripheral neuropathy: no Coronary artery disease: no Stroke: no  Relevant comorbidities and cardiovascular risk factors: Obesity: no Body mass index is 24.25 kg/m.  Hypertension: no Hyperlipidemia : Yes, on statin   Current / Home Diabetic regimen includes:  Metformin  XR 2000 mg daily.   Pioglitazone  30 mg daily.   Ozempic  1 mg weekly. Glimepiride /Amaryl  2 mg with supper.  Prior diabetic medications: Prandin , Trulicity  in the past.  Reports history of hypoglycemia with higher dose of Amaryl  4 mg in the past.  He tends to lose significant weight  when taking Ozempic  1 mg weekly in the past.  Glycemic data:   One Touch Verio reflect glucometer.  July 22 to August 5 , 2025 reviewed.  Average blood sugar 135.  Highest blood sugar 191 and lowest 61.  He has been checking 2-3 times a day at different times of the day.  Random mild hyperglycemia with blood sugar up to 170-190.  Most of the blood sugar acceptable.  Occasional blood sugar in the 90s 95, 111, 104.  No concerning hypoglycemia.    Hypoglycemia: Patient has no hypoglycemic episodes. Patient has hypoglycemia  awareness.  Factors modifying glucose control: 1.  Diabetic diet assessment: 3 meals a day.  He has been eating healthy diet.  2.  Staying active or exercising: Walking.  3.  Medication compliance: compliant all of the time.  Interval history  Glucometer data as reviewed above.  Random mild hyperglycemia.  Hemoglobin A1c worsened to 7.5%.  Diabetes regimen is reviewed and noted above.  Worsening continuation of diabetes control.  He is gradually losing weight with Ozempic .  Reports walking occasionally as a part of exercise.  No other complaints today.  Recent laboratory results reviewed.  Acceptable cholesterol level.  REVIEW OF SYSTEMS As per history of present illness.   PAST MEDICAL HISTORY: Past Medical History:  Diagnosis Date   Allergy    Diabetes mellitus without complication (HCC)     PAST SURGICAL HISTORY: Past Surgical History:  Procedure Laterality Date   CATARACT EXTRACTION Bilateral 01/2024   EYE SURGERY  01/11/2013   Eyelid surgery    ALLERGIES: No Known Allergies  FAMILY HISTORY:  Family History  Problem Relation Age of Onset   Diabetes Mother    Arthritis Mother    Diabetes Brother    Cancer Neg Hx    Heart disease Neg Hx     SOCIAL HISTORY: Social History  Socioeconomic History   Marital status: Married    Spouse name: Olam   Number of children: 1   Years of education: Not on file   Highest education level: Not on file  Occupational History   Occupation: Engineering    Comment: [employer]  Tobacco Use   Smoking status: Never   Smokeless tobacco: Never  Substance and Sexual Activity   Alcohol use: No   Drug use: No   Sexual activity: Yes  Other Topics Concern   Not on file  Social History Narrative   Lives with his wife.  Their daughter lives in [place], [place].   Social Drivers of Corporate investment banker Strain: Low Risk  (04/18/2023)   Overall Financial Resource Strain (CARDIA)    Difficulty of Paying Living  Expenses: Not hard at all  Food Insecurity: No Food Insecurity (04/18/2023)   Hunger Vital Sign    Worried About Running Out of Food in the Last Year: Never true    Ran Out of Food in the Last Year: Never true  Transportation Needs: No Transportation Needs (04/18/2023)   PRAPARE - Administrator, Civil Service (Medical): No    Lack of Transportation (Non-Medical): No  Physical Activity: Inactive (04/18/2023)   Exercise Vital Sign    Days of Exercise per Week: 0 days    Minutes of Exercise per Session: 0 min  Stress: No Stress Concern Present (02/28/2022)   [employer] of Occupational Health - Occupational Stress Questionnaire    Feeling of Stress : Not at all  Social Connections: Moderately Integrated (04/18/2023)   Social Connection and Isolation Panel    Frequency of Communication with Friends and Family: More than three times a week    Frequency of Social Gatherings with Friends and Family: Never    Attends Religious Services: More than 4 times per year    Active Member of [employer] or Organizations: No    Attends Banker Meetings: Never    Marital Status: Married    MEDICATIONS:  Current Outpatient Medications  Medication Sig Dispense Refill   Cyanocobalamin (B-12 PO) Take by mouth.     fluticasone  (FLONASE ) 50 MCG/ACT nasal spray Place 2 sprays into both nostrils daily. 16 g 0   glimepiride  (AMARYL ) 2 MG tablet Take 1 tablet (2 mg total) by mouth daily with supper. 90 tablet 3   glucose blood test strip 1 each by Other route as needed for other. Use as instructed to check blood sugar once daily. 100 each 3   metFORMIN  (GLUCOPHAGE -XR) 500 MG 24 hr tablet Take 4 tablets (2,000 mg total) by mouth daily. 360 tablet 3   pioglitazone  (ACTOS ) 30 MG tablet Take 1 tablet (30 mg total) by mouth daily. 90 tablet 3   pravastatin  (PRAVACHOL ) 20 MG tablet Take 1 tablet (20 mg total) by mouth daily. 90 tablet 3   Semaglutide , 1 MG/DOSE, (OZEMPIC , 1 MG/DOSE,) 4 MG/3ML SOPN  INJECT 1 MG INTO THE SKIN ONE TIME PER WEEK 9 mL 3   tadalafil  (CIALIS ) 5 MG tablet Take 5 mg by mouth daily.     zinc gluconate 50 MG tablet Take by mouth.     No current facility-administered medications for this visit.    PHYSICAL EXAM: Vitals:   06/17/24 0946 06/17/24 0947  BP: (!) 150/80 138/74  Pulse: 73   Resp: 20   SpO2: 97%   Weight: 164 lb 3.2 oz (74.5 kg)   Height: 5' 9 (1.753 m)  Body mass index is 24.25 kg/m.  Wt Readings from Last 3 Encounters:  06/17/24 164 lb 3.2 oz (74.5 kg)  02/12/24 163 lb 6.4 oz (74.1 kg)  10/24/23 166 lb 12.8 oz (75.7 kg)    General: Well developed, well nourished male in no apparent distress.  HEENT: AT/[place], no external lesions.  Eyes: Conjunctiva clear and no icterus. Neck: Neck supple  Lungs: Respirations not labored Neurologic: Alert, oriented, normal speech Extremities / Skin: Dry.  Psychiatric: Does not appear depressed or anxious  Diabetic Foot Exam - Simple   Simple Foot Form Diabetic Foot exam was performed with the following findings: Yes 06/17/2024 10:15 AM  Visual Inspection No deformities, no ulcerations, no other skin breakdown bilaterally: Yes Sensation Testing Intact to touch and monofilament testing bilaterally: Yes Pulse Check Posterior Tibialis and Dorsalis pulse intact bilaterally: Yes Comments    LABS Reviewed Lab Results  Component Value Date   HGBA1C 7.5 (H) 06/13/2024   HGBA1C 6.8 (H) 02/07/2024   HGBA1C 7.3 (H) 10/19/2023   Lab Results  Component Value Date   FRUCTOSAMINE 300 (H) 03/22/2023   FRUCTOSAMINE 265 11/11/2013   Lab Results  Component Value Date   CHOL 153 06/13/2024   HDL 42 06/13/2024   LDLCALC 84 06/13/2024   LDLDIRECT 95.0 04/18/2019   TRIG 176 (H) 06/13/2024   CHOLHDL 3.6 06/13/2024   Lab Results  Component Value Date   MICRALBCREAT 3 02/07/2024   MICRALBCREAT 2.9 01/26/2014   Lab Results  Component Value Date   CREATININE 1.04 06/13/2024   Lab Results   Component Value Date   GFR 74.54 06/11/2023    ASSESSMENT / PLAN  1. Uncontrolled type 2 diabetes mellitus with hyperglycemia, without long-term current use of insulin (HCC)   2. Dyslipidemia, goal LDL below 100     Diabetes Mellitus type 2, complicated by no other known complications. - Diabetic status / severity: Fair control.  Lab Results  Component Value Date   HGBA1C 7.5 (H) 06/13/2024    - Hemoglobin A1c goal : <6.5%  Discussed about increasing dose of Ozempic  due to worsening diabetes control, patient prefers not to take higher dose of Ozempic , does not want to lose any more weight.  - Medications: See below.  No change today.  Do not want to increase the dose of glimepiride  especially with frequent low normal blood sugar, avoid hypoglycemia.  I) continue metformin  XR 2000 mg daily.   II) continue pioglitazone /Actos  30 mg daily. III) continue Ozempic  1 mg weekly. IV) continue Amaryl  2 mg daily with supper.   - Home glucose testing: At least in the morning fasting and at bedtime.  - Discussed/ Gave Hypoglycemia treatment plan.  # Consult : not required at this time.   # Annual urine for microalbuminuria/ creatinine ratio, no microalbuminuria currently. Last  Lab Results  Component Value Date   MICRALBCREAT 3 02/07/2024    # Foot check nightly.  # Annual dilated diabetic eye exams.   - Diet: Make healthy diabetic food choices - Life style / activity / exercise: Discussed.  2. Blood pressure  -  BP Readings from Last 1 Encounters:  06/17/24 138/74    - Control is in target.  - No change in current plans.  3. Lipid status / Hyperlipidemia - Last  Lab Results  Component Value Date   LDLCALC 84 06/13/2024   - Continue pravastatin  20 mg daily.    Diagnoses and all orders for this visit:  Uncontrolled type 2 diabetes mellitus  with hyperglycemia, without long-term current use of insulin (HCC)  Dyslipidemia, goal LDL below  100    DISPOSITION Follow up in clinic in 3 months suggested.    All questions answered and patient verbalized understanding of the plan.  Joseph Kinjal Neitzke, MD [facility]in[facility] [address] [place], [address] [place], [place] [address] Phone # [phone]  At least part of this note was generated using voice recognition software. Inadvertent word errors may have occurred, which were not recognized during the proofreading process.

## 2024-06-25 DIAGNOSIS — H524 Presbyopia: Secondary | ICD-10-CM | POA: Diagnosis not present

## 2024-06-25 DIAGNOSIS — Z794 Long term (current) use of insulin: Secondary | ICD-10-CM | POA: Diagnosis not present

## 2024-06-25 DIAGNOSIS — H40013 Open angle with borderline findings, low risk, bilateral: Secondary | ICD-10-CM | POA: Diagnosis not present

## 2024-06-25 DIAGNOSIS — H21233 Degeneration of iris (pigmentary), bilateral: Secondary | ICD-10-CM | POA: Diagnosis not present

## 2024-06-25 DIAGNOSIS — E119 Type 2 diabetes mellitus without complications: Secondary | ICD-10-CM | POA: Diagnosis not present

## 2024-06-25 LAB — HM DIABETES EYE EXAM

## 2024-07-02 ENCOUNTER — Ambulatory Visit (INDEPENDENT_AMBULATORY_CARE_PROVIDER_SITE_OTHER)

## 2024-07-02 VITALS — Ht 69.0 in | Wt 164.0 lb

## 2024-07-02 DIAGNOSIS — Z0000 Encounter for general adult medical examination without abnormal findings: Secondary | ICD-10-CM

## 2024-07-02 NOTE — Patient Instructions (Signed)
 Mr. Ruz , Thank you for taking time out of your busy schedule to complete your Annual Wellness Visit with me. I enjoyed our conversation and look forward to speaking with you again next year. I, as well as your care team,  appreciate your ongoing commitment to your health goals. Please review the following plan we discussed and let me know if I can assist you in the future. Your Game plan/ To Do List    Follow up Visits: We will see or speak with you next year for your Next Medicare AWV with our clinical staff Have you seen your provider in the last 6 months (3 months if uncontrolled diabetes)? No. Last office visit on 01/11/2023.  Clinician Recommendations:  Aim for 30 minutes of exercise or brisk walking, 6-8 glasses of water, and 5 servings of fruits and vegetables each day. You are due for a tetanus and a Shingles vaccine.  You can get these at any local pharmacy.       This is a list of the screenings recommended for you:  Health Maintenance  Topic Date Due   DTaP/Tdap/Td vaccine (1 - Tdap) Never done   Zoster (Shingles) Vaccine (1 of 2) 01/28/2003   COVID-19 Vaccine (6 - 2024-25 season) 03/13/2024   Medicare Annual Wellness Visit  04/17/2024   Flu Shot  06/13/2024   Hemoglobin A1C  12/14/2024   Yearly kidney health urinalysis for diabetes  02/06/2025   Yearly kidney function blood test for diabetes  06/13/2025   Complete foot exam   06/17/2025   Eye exam for diabetics  06/25/2025   Colon Cancer Screening  10/20/2030   Pneumococcal Vaccine for age over 19  Completed   Hepatitis C Screening  Completed   HPV Vaccine  Aged Out   Meningitis B Vaccine  Aged Out    Advanced directives: (Copy Requested) Please bring a copy of your health care power of attorney and living will to the office to be added to your chart at your convenience. You can mail to [facility] [address] [place], [place] [address] or email to [email or web address] Advance Care  Planning is important because it:  [x]  Makes sure you receive the medical care that is consistent with your values, goals, and preferences  [x]  It provides guidance to your family and loved ones and reduces their decisional burden about whether or not they are making the right decisions based on your wishes.  Follow the link provided in your after visit summary or read over the paperwork we have mailed to you to help you started getting your Advance Directives in place. If you need assistance in completing these, please reach out to us  so that we can help you!  See attachments for Preventive Care and Fall Prevention Tips.

## 2024-07-02 NOTE — Progress Notes (Signed)
 Subjective:   Joseph Sharp is a 73 y.o. who presents for a Medicare Wellness preventive visit.  As a reminder, Annual Wellness Visits don't include a physical exam, and some assessments may be limited, especially if this visit is performed virtually. We may recommend an in-person follow-up visit with your provider if needed.  Visit Complete: Virtual I connected with  Vicenta FORBES Crimes on 07/02/24 by a audio enabled telemedicine application and verified that I am speaking with the correct person using two identifiers.  Patient Location: Home  Provider Location: Home Office  I discussed the limitations of evaluation and management by telemedicine. The patient expressed understanding and agreed to proceed.  Vital Signs: Because this visit was a virtual/telehealth visit, some criteria may be missing or patient reported. Any vitals not documented were not able to be obtained and vitals that have been documented are patient reported.  VideoDeclined- This patient declined Librarian, academic. Therefore the visit was completed with audio only.  Persons Participating in Visit: Patient.  AWV Questionnaire: No: Patient Medicare AWV questionnaire was not completed prior to this visit.  Cardiac Risk Factors include: advanced age (>27men, >63 women);diabetes mellitus;dyslipidemia;male gender     Objective:    Today's Vitals   07/02/24 1440  Weight: 164 lb (74.4 kg)  Height: 5' 9 (1.753 m)   Body mass index is 24.22 kg/m.     07/02/2024    2:57 PM 04/18/2023   11:29 AM 02/28/2022    2:52 PM 02/15/2022    9:39 AM 08/28/2018    2:19 PM  Advanced Directives  Does Patient Have a Medical Advance Directive? Yes No No No No   Type of Advance Directive Living will;Healthcare Power of Attorney      Copy of Healthcare Power of Attorney in Chart? No - copy requested      Would patient like information on creating a medical advance directive?  No - Patient  declined No - Patient declined No - Patient declined Yes (MAU/Ambulatory/Procedural Areas - Information given)      Data saved with a previous flowsheet row definition    Current Medications (verified) Outpatient Encounter Medications as of 07/02/2024  Medication Sig   Cyanocobalamin (B-12 PO) Take by mouth.   fluticasone  (FLONASE ) 50 MCG/ACT nasal spray Place 2 sprays into both nostrils daily.   glimepiride  (AMARYL ) 2 MG tablet Take 1 tablet (2 mg total) by mouth daily with supper.   glucose blood test strip 1 each by Other route as needed for other. Use as instructed to check blood sugar once daily.   metFORMIN  (GLUCOPHAGE -XR) 500 MG 24 hr tablet Take 4 tablets (2,000 mg total) by mouth daily.   pioglitazone  (ACTOS ) 30 MG tablet Take 1 tablet (30 mg total) by mouth daily.   pravastatin  (PRAVACHOL ) 20 MG tablet Take 1 tablet (20 mg total) by mouth daily.   Semaglutide , 1 MG/DOSE, (OZEMPIC , 1 MG/DOSE,) 4 MG/3ML SOPN INJECT 1 MG INTO THE SKIN ONE TIME PER WEEK   tadalafil  (CIALIS ) 5 MG tablet Take 5 mg by mouth daily.   zinc gluconate 50 MG tablet Take by mouth.   No facility-administered encounter medications on file as of 07/02/2024.    Allergies (verified) Patient has no known allergies.   History: Past Medical History:  Diagnosis Date   Allergy    Diabetes mellitus without complication [facility])    Past Surgical History:  Procedure Laterality Date   CATARACT EXTRACTION Bilateral 01/2024   EYE SURGERY  01/11/2013  Eyelid surgery   Family History  Problem Relation Age of Onset   Diabetes Mother    Arthritis Mother    Diabetes Brother    Cancer Neg Hx    Heart disease Neg Hx    Social History   Socioeconomic History   Marital status: Married    Spouse name: Olam   Number of children: 1   Years of education: Not on file   Highest education level: Not on file  Occupational History   Occupation: RETIRED/Engineering    Comment: [employer]  Tobacco Use    Smoking status: Never   Smokeless tobacco: Never  Vaping Use   Vaping status: Never Used  Substance and Sexual Activity   Alcohol use: No   Drug use: No   Sexual activity: Yes  Other Topics Concern   Not on file  Social History Narrative   Lives with his wife.  Their daughter lives in [place], [place].   Social Drivers of Corporate investment banker Strain: Low Risk  (07/02/2024)   Overall Financial Resource Strain (CARDIA)    Difficulty of Paying Living Expenses: Not hard at all  Food Insecurity: No Food Insecurity (07/02/2024)   Hunger Vital Sign    Worried About Running Out of Food in the Last Year: Never true    Ran Out of Food in the Last Year: Never true  Transportation Needs: No Transportation Needs (07/02/2024)   PRAPARE - Administrator, Civil Service (Medical): No    Lack of Transportation (Non-Medical): No  Physical Activity: Inactive (07/02/2024)   Exercise Vital Sign    Days of Exercise per Week: 0 days    Minutes of Exercise per Session: 0 min  Stress: No Stress Concern Present (07/02/2024)   [employer] of Occupational Health - Occupational Stress Questionnaire    Feeling of Stress: Not at all  Social Connections: Unknown (07/02/2024)   Social Connection and Isolation Panel    Frequency of Communication with Friends and Family: Once a week    Frequency of Social Gatherings with Friends and Family: Not on file    Attends Religious Services: More than 4 times per year    Active Member of [employer] or Organizations: Yes    Attends Banker Meetings: 1 to 4 times per year    Marital Status: Married    Tobacco Counseling Counseling given: Not Answered    Clinical Intake:  Pre-visit preparation completed: Yes  Pain : No/denies pain     BMI - recorded: 24.22 Nutritional Status: BMI of 19-24  Normal Nutritional Risks: Nausea/ vomitting/ diarrhea Diabetes: Yes Did pt. bring in CBG monitor from home?: No  Lab Results  Component  Value Date   HGBA1C 7.5 (H) 06/13/2024   HGBA1C 6.8 (H) 02/07/2024   HGBA1C 7.3 (H) 10/19/2023     How often do you need to have someone help you when you read instructions, pamphlets, or other written materials from your doctor or pharmacy?: 1 - Never  Interpreter Needed?: No  Information entered by :: Deaven Urwin, RMA   Activities of Daily Living     07/02/2024    2:41 PM  In your present state of health, do you have any difficulty performing the following activities:  Hearing? 1  Comment Wears hearing aides  Vision? 0  Difficulty concentrating or making decisions? 0  Walking or climbing stairs? 0  Dressing or bathing? 0  Doing errands, shopping? 0  Preparing Food and eating ? N  Using the Toilet? N  In the past six months, have you accidently leaked urine? N  Do you have problems with loss of bowel control? N  Managing your Medications? N  Managing your Finances? N  Housekeeping or managing your Housekeeping? N    Patient Care Team: Levora Reyes SAUNDERS, MD as PCP - General (Family Medicine)  I have updated your Care Teams any recent Medical Services you may have received from other providers in the past year.     Assessment:   This is a routine wellness examination for Hitchcock.  Hearing/Vision screen Hearing Screening - Comments:: Wears hearing aides Vision Screening - Comments:: Denies vision issues./[place]/Dr. Fleeta    Goals Addressed   None    Depression Screen     07/02/2024    3:00 PM 04/18/2023   11:23 AM 01/11/2023   10:33 AM 06/07/2022    9:19 AM 03/30/2022    9:19 AM 02/28/2022    2:57 PM 01/23/2022    1:44 PM  PHQ 2/9 Scores  PHQ - 2 Score 0 0 0 0 0 0 0  PHQ- 9 Score 1 2 0 2 1  2     Fall Risk     07/02/2024    2:57 PM 04/18/2023   11:18 AM 01/11/2023   10:33 AM 06/07/2022    9:19 AM 03/30/2022    9:19 AM  Fall Risk   Falls in the past year? 0 0 0 0 0  Number falls in past yr:  0 0 0 0  Injury  with Fall? 0 0 0 0 0  Risk for fall due to :   No Fall Risks No Fall Risks No Fall Risks  Follow up Falls evaluation completed;Falls prevention discussed Falls evaluation completed;Education provided;Falls prevention discussed Falls evaluation completed Falls evaluation completed  Falls evaluation completed      Data saved with a previous flowsheet row definition    MEDICARE RISK AT HOME:  Medicare Risk at Home Any stairs in or around the home?: Yes If so, are there any without handrails?: No Home free of loose throw rugs in walkways, pet beds, electrical cords, etc?: Yes Adequate lighting in your home to reduce risk of falls?: Yes Life alert?: No Use of a cane, walker or w/c?: No Grab bars in the bathroom?: No Shower chair or bench in shower?: Yes Elevated toilet seat or a handicapped toilet?: Yes  TIMED UP AND GO:  Was the test performed?  No  Cognitive Function: Declined/Normal: No cognitive concerns noted by patient or family. Patient alert, oriented, able to answer questions appropriately and recall recent events. No signs of memory loss or confusion.        04/18/2023   11:21 AM 02/28/2022    3:05 PM 08/28/2018    2:17 PM  6CIT Screen  What Year? 0 points 0 points 0 points  What month? 0 points 0 points 0 points  What time? 0 points 0 points 0 points  Count back from 20 0 points 0 points 0 points  Months in reverse 0 points 0 points 0 points  Repeat phrase 0 points 0 points 0 points  Total Score 0 points 0 points 0 points    Immunizations Immunization History  Administered Date(s) Administered   Influenza, High Dose Seasonal PF 08/28/2018, 08/26/2019, 08/21/2021   Influenza,inj,Quad PF,6+ Mos 09/04/2014, 08/18/2015, 08/08/2016, 10/12/2017   PFIZER(Purple Top)SARS-COV-2 Vaccination 12/03/2019, 12/24/2019, 04/29/2021   Pfizer Covid-19 Vaccine Bivalent Booster 61yrs &  up 08/21/2021   Pfizer(Comirnaty)Fall Seasonal Vaccine 12 years and older 09/14/2023   Pneumococcal  Conjugate-13 04/26/2016   Pneumococcal Polysaccharide-23 08/28/2018   Zoster, Live 09/04/2014    Screening Tests Health Maintenance  Topic Date Due   DTaP/Tdap/Td (1 - Tdap) Never done   Zoster Vaccines- Shingrix (1 of 2) 01/28/2003   COVID-19 Vaccine (6 - 2024-25 season) 03/13/2024   Medicare Annual Wellness (AWV)  04/17/2024   INFLUENZA VACCINE  06/13/2024   HEMOGLOBIN A1C  12/14/2024   Diabetic kidney evaluation - Urine ACR  02/06/2025   Diabetic kidney evaluation - eGFR measurement  06/13/2025   FOOT EXAM  06/17/2025   OPHTHALMOLOGY EXAM  06/25/2025   Colonoscopy  10/20/2030   Pneumococcal Vaccine: 50+ Years  Completed   Hepatitis C Screening  Completed   HPV VACCINES  Aged Out   Meningococcal B Vaccine  Aged Out    Health Maintenance  Health Maintenance Due  Topic Date Due   DTaP/Tdap/Td (1 - Tdap) Never done   Zoster Vaccines- Shingrix (1 of 2) 01/28/2003   COVID-19 Vaccine (6 - 2024-25 season) 03/13/2024   Medicare Annual Wellness (AWV)  04/17/2024   INFLUENZA VACCINE  06/13/2024   Health Maintenance Items Addressed: See Nurse Notes at the end of this note  Additional Screening:  Vision Screening: Recommended annual ophthalmology exams for early detection of glaucoma and other disorders of the eye. Would you like a referral to an eye doctor? No    Dental Screening: Recommended annual dental exams for proper oral hygiene  Community Resource Referral / Chronic Care Management: CRR required this visit?  No   CCM required this visit?  No   Plan:    I have personally reviewed and noted the following in the patient's chart:   Medical and social history Use of alcohol, tobacco or illicit drugs  Current medications and supplements including opioid prescriptions. Patient is not currently taking opioid prescriptions. Functional ability and status Nutritional status Physical activity Advanced directives List of other physicians Hospitalizations, surgeries,  and ER visits in previous 12 months Vitals Screenings to include cognitive, depression, and falls Referrals and appointments  In addition, I have reviewed and discussed with patient certain preventive protocols, quality metrics, and best practice recommendations. A written personalized care plan for preventive services as well as general preventive health recommendations were provided to patient.   Ashlin Hidalgo L Dacari Beckstrand, CMA   07/02/2024   After Visit Summary: (MyChart) Due to this being a telephonic visit, the after visit summary with patients personalized plan was offered to patient via MyChart   Notes: Patient is due a Tdap and Shingles vaccine.  Patient stated that he will call the office to schedule his physical.

## 2024-07-04 ENCOUNTER — Encounter: Payer: Self-pay | Admitting: Family Medicine

## 2024-07-04 ENCOUNTER — Ambulatory Visit (INDEPENDENT_AMBULATORY_CARE_PROVIDER_SITE_OTHER): Admitting: Family Medicine

## 2024-07-04 VITALS — BP 120/70 | HR 77 | Temp 98.1°F | Resp 16 | Ht 69.39 in | Wt 162.4 lb

## 2024-07-04 DIAGNOSIS — Z0000 Encounter for general adult medical examination without abnormal findings: Secondary | ICD-10-CM

## 2024-07-04 NOTE — Progress Notes (Signed)
 Subjective:  Patient ID: Joseph Sharp, male    DOB: 11/03/1951  Age: 73 y.o. MRN: [id number]  CC:  Chief Complaint  Patient presents with   Annual Exam    Soft stool and diarrhea. Thinks its caused by his medication. Did not start a new medication. Its been an ongoing thing he reports.    HPI Joseph Sharp presents for Annual Exam PCP, me Endocrinology, Dr. Mercie, uncontrolled type 2 diabetes.Last visit 06/17/2024 noted.  Continued on metformin  XR 2000 mg daily, Actos  30 mg daily, Ozempic  1 mg weekly and Amaryl  2 mg daily with supper.  Some loose stool, occasional diarrhea with metformin  - similar as in past - no new symptoms. Diarrhea most days. Takes Immodium every few weeks - only if repetitive episode.  Optho, Dr. Meridee, Dr. Fleeta at [facility]. Cataracts, office visit February 25. Ortho/spine, Dr. Burnetta, vertebrogenic low back pain treated in 2024. Doing ok - followed by chiropractor now  Audiology, Eleanor Angles sensorineural hearing loss, eval July 2024. Heaing aids since last year - working ok.  Urology, Dr. Lovie - due for follow up- he sent email to schedule. Tadalafil  for ED, peyronies.   Hyperlipidemia: Pravastatin  20 mg daily, followed by endocrinology as above for diabetes.  Recent lipids noted from August 1. Lab Results  Component Value Date   CHOL 153 06/13/2024   HDL 42 06/13/2024   LDLCALC 84 06/13/2024   LDLDIRECT 95.0 04/18/2019   TRIG 176 (H) 06/13/2024   CHOLHDL 3.6 06/13/2024   Lab Results  Component Value Date   ALT 13 01/09/2023   AST 12 01/09/2023   ALKPHOS 72 01/09/2023   BILITOT 0.6 01/09/2023         07/04/2024   11:34 AM 07/02/2024    3:00 PM 04/18/2023   11:23 AM 01/11/2023   10:33 AM 06/07/2022    9:19 AM  Depression screen PHQ 2/9  Decreased Interest 0 0 0 0 0  Down, Depressed, Hopeless 0 0 0 0 0  PHQ - 2 Score 0 0 0 0 0  Altered sleeping 3 0 1 0 1  Tired, decreased energy 1 1 1  0 1  Change in appetite 0 0 0 0 0   Feeling bad or failure about yourself  0 0 0 0 0  Trouble concentrating 0 0 0 0 0  Moving slowly or fidgety/restless 0 0 0 0 0  Suicidal thoughts 0 0 0 0 0  PHQ-9 Score 4 1 2  0 2  Difficult doing work/chores Not difficult at all Not difficult at all       Health Maintenance  Topic Date Due   DTaP/Tdap/Td (1 - Tdap) Never done   Zoster Vaccines- Shingrix (1 of 2) 01/28/2003   COVID-19 Vaccine (6 - 2024-25 season) 03/13/2024   INFLUENZA VACCINE  06/13/2024   HEMOGLOBIN A1C  12/14/2024   Diabetic kidney evaluation - Urine ACR  02/06/2025   Diabetic kidney evaluation - eGFR measurement  06/13/2025   FOOT EXAM  06/17/2025   OPHTHALMOLOGY EXAM  06/25/2025   Medicare Annual Wellness (AWV)  07/02/2025   Colonoscopy  10/20/2030   Pneumococcal Vaccine: 50+ Years  Completed   Hepatitis C Screening  Completed   HPV VACCINES  Aged Out   Meningococcal B Vaccine  Aged Out  Colonoscopy 10/20/2020.  Tubular adenomas with planned repeat in 3 years. Prostate: followed by urology  - testing deferred to urology - plans appt soon. Some nighttime urination - 1-2 times per night.  Some change in stream. No acute changes. No daytime symptoms.  Lab Results  Component Value Date   PSA1 0.5 08/28/2018   PSA 0.34 02/13/2022      Immunization History  Administered Date(s) Administered   Influenza, High Dose Seasonal PF 08/28/2018, 08/26/2019, 08/21/2021, 09/14/2023   Influenza,inj,Quad PF,6+ Mos 09/04/2014, 08/18/2015, 08/08/2016, 10/12/2017   PFIZER(Purple Top)SARS-COV-2 Vaccination 12/03/2019, 12/24/2019, 04/29/2021   Pfizer Covid-19 Vaccine Bivalent Booster 76yrs & up 08/21/2021   Pfizer(Comirnaty)Fall Seasonal Vaccine 12 years and older 09/14/2023   Pneumococcal Conjugate-13 04/26/2016   Pneumococcal Polysaccharide-23 08/28/2018   Zoster, Live 09/04/2014  Shingrix recommended at pharmacy. Flu and covid booster recommended at pharmacy.    No results found. Regular optho eval as above.    Dental: every 45mo  Alcohol:none  Tobacco: none   Exercise: yardwork, painting. Less gym exercise after wife's knee surgery.    History Patient Active Problem List   Diagnosis Date Noted   Controlled diabetes mellitus type II without complication (HCC) 04/06/2023   Hyperlipidemia 04/06/2023   ED (erectile dysfunction) of organic origin 09/04/2014   Allergic rhinitis 08/10/2014   Diabetes mellitus, stable (HCC) 06/17/2013   Pure hypercholesterolemia 06/17/2013   Past Medical History:  Diagnosis Date   Allergy    Diabetes mellitus without complication [facility])    Past Surgical History:  Procedure Laterality Date   CATARACT EXTRACTION Bilateral 01/2024   EYE SURGERY  01/11/2013   Eyelid surgery   No Known Allergies Prior to Admission medications   Medication Sig Start Date End Date Taking? Authorizing Provider  Cyanocobalamin (B-12 PO) Take by mouth.   Yes [provider]  fluticasone  (FLONASE ) 50 MCG/ACT nasal spray Place 2 sprays into both nostrils daily. 10/13/21  Yes Richad Jon HERO, NP  glimepiride  (AMARYL ) 2 MG tablet Take 1 tablet (2 mg total) by mouth daily with supper. 10/24/23  Yes Thapa, [place], MD  glucose blood test strip 1 each by Other route as needed for other. Use as instructed to check blood sugar once daily. 12/19/18  Yes Von [place], MD  metFORMIN  (GLUCOPHAGE -XR) 500 MG 24 hr tablet Take 4 tablets (2,000 mg total) by mouth daily. 10/24/23  Yes Thapa, [place], MD  pioglitazone  (ACTOS ) 30 MG tablet Take 1 tablet (30 mg total) by mouth daily. 10/24/23  Yes Thapa, [place], MD  pravastatin  (PRAVACHOL ) 20 MG tablet Take 1 tablet (20 mg total) by mouth daily. 10/24/23  Yes Thapa, [place], MD  Semaglutide , 1 MG/DOSE, (OZEMPIC , 1 MG/DOSE,) 4 MG/3ML SOPN INJECT 1 MG INTO THE SKIN ONE TIME PER WEEK 10/24/23  Yes Thapa, [place], MD  tadalafil  (CIALIS ) 5 MG tablet Take 5 mg by mouth daily.   Yes [provider]  zinc gluconate 50 MG tablet Take by mouth.   Yes  [provider]   Social History   Socioeconomic History   Marital status: Married    Spouse name: Olam   Number of children: 1   Years of education: Not on file   Highest education level: Not on file  Occupational History   Occupation: RETIRED/Engineering    Comment: [employer]  Tobacco Use   Smoking status: Never   Smokeless tobacco: Never  Vaping Use   Vaping status: Never Used  Substance and Sexual Activity   Alcohol use: No   Drug use: No   Sexual activity: Yes  Other Topics Concern   Not on file  Social History Narrative   Lives with his wife.  Their daughter lives in [place], [place].  Social Drivers of Corporate investment banker Strain: Low Risk  (07/02/2024)   Overall Financial Resource Strain (CARDIA)    Difficulty of Paying Living Expenses: Not hard at all  Food Insecurity: No Food Insecurity (07/02/2024)   Hunger Vital Sign    Worried About Running Out of Food in the Last Year: Never true    Ran Out of Food in the Last Year: Never true  Transportation Needs: No Transportation Needs (07/02/2024)   PRAPARE - Administrator, Civil Service (Medical): No    Lack of Transportation (Non-Medical): No  Physical Activity: Inactive (07/02/2024)   Exercise Vital Sign    Days of Exercise per Week: 0 days    Minutes of Exercise per Session: 0 min  Stress: No Stress Concern Present (07/02/2024)   [employer] of Occupational Health - Occupational Stress Questionnaire    Feeling of Stress: Not at all  Social Connections: Unknown (07/02/2024)   Social Connection and Isolation Panel    Frequency of Communication with Friends and Family: Once a week    Frequency of Social Gatherings with Friends and Family: Not on file    Attends Religious Services: More than 4 times per year    Active Member of [employer] or Organizations: Yes    Attends Banker Meetings: 1 to 4 times per year    Marital Status: Married  Catering manager Violence:  Not At Risk (07/02/2024)   Humiliation, Afraid, Rape, and Kick questionnaire    Fear of Current or Ex-Partner: No    Emotionally Abused: No    Physically Abused: No    Sexually Abused: No    Review of Systems 13 point review of systems per patient health survey noted.  Negative other than as indicated above or in HPI.    Objective:   Vitals:   07/04/24 1130  BP: 120/70  Pulse: 77  Resp: 16  Temp: 98.1 F (36.7 C)  TempSrc: Temporal  SpO2: 97%  Weight: 162 lb 6.4 oz (73.7 kg)  Height: 5' 9.39 (1.763 m)     Physical Exam Vitals reviewed.  Constitutional:      Appearance: He is well-developed.  HENT:     Head: Normocephalic and atraumatic.     Right Ear: External ear normal.     Left Ear: External ear normal.  Eyes:     Conjunctiva/sclera: Conjunctivae normal.     Pupils: Pupils are equal, round, and reactive to light.  Neck:     Thyroid : No thyromegaly.  Cardiovascular:     Rate and Rhythm: Normal rate and regular rhythm.     Heart sounds: Normal heart sounds.  Pulmonary:     Effort: Pulmonary effort is normal. No respiratory distress.     Breath sounds: Normal breath sounds. No wheezing.  Abdominal:     General: There is no distension.     Palpations: Abdomen is soft.     Tenderness: There is no abdominal tenderness.  Musculoskeletal:        General: No tenderness. Normal range of motion.     Cervical back: Normal range of motion and neck supple.  Lymphadenopathy:     Cervical: No cervical adenopathy.  Skin:    General: Skin is warm and dry.  Neurological:     Mental Status: He is alert and oriented to person, place, and time.     Deep Tendon Reflexes: Reflexes are normal and symmetric.  Psychiatric:        Behavior: Behavior  normal.        Assessment & Plan:  Joseph Sharp is a 73 y.o. male . Annual physical exam -anticipatory guidance as below in AVS, screening labs above. Health maintenance items as above in HPI discussed/recommended as  applicable.  - Appears to be overdue for colonoscopy based on last reading in 2021.  Phone number provided for [facility] to schedule.  Also recommended discussion of diarrhea/loose stools with gastroenterology but likely related to his metformin .  Denies recent acute changes. - Episodic nocturia, change in urinary stream but notices at night only.  As he is following up with urology soon recommended he discuss symptoms at that time including need for additional testing or change in meds.  No changes for now. - Continue follow-up with endocrinology, no med changes, recent labs noted, hold on labs at this time. - Vaccinations through pharmacy discussed.  No orders of the defined types were placed in this encounter.  Patient Instructions  Call [facility] as it appears you are overdue for repeat colonoscopy - 3 year repeat planned in 2021. I would also discuss the continued diarrhea with GI as well although could still be due to your meds.  [facility] Address: [address] [place], [place] [address] Phone: [phone]  Follow-up with Dr. Lovie as planned.  Avoiding fluids within an hour of bedtime may be helpful but discussed the urinary symptoms at night with urology.  Keep follow-up with endocrinologist.  No med changes for me at this time and I do not see any need for blood work since you just recently had blood work with endocrinology.  Shingles vaccine can be given at your pharmacy as well as the flu and COVID boosters.  Let me know if you have any questions and take care!   Preventive Care 11 Years and Older, Male Preventive care refers to lifestyle choices and visits with your health care provider that can promote health and wellness. Preventive care visits are also called wellness exams. What can I expect for my preventive care visit? Counseling During your preventive care visit, your health care provider may ask about your: Medical history, including: Past medical  problems. Family medical history. History of falls. Current health, including: Emotional well-being. Home life and relationship well-being. Sexual activity. Memory and ability to understand (cognition). Lifestyle, including: Alcohol, nicotine or tobacco, and drug use. Access to firearms. Diet, exercise, and sleep habits. Work and work Astronomer. Sunscreen use. Safety issues such as seatbelt and bike helmet use. Physical exam Your health care provider will check your: Height and weight. These may be used to calculate your BMI (body mass index). BMI is a measurement that tells if you are at a healthy weight. Waist circumference. This measures the distance around your waistline. This measurement also tells if you are at a healthy weight and may help predict your risk of certain diseases, such as type 2 diabetes and high blood pressure. Heart rate and blood pressure. Body temperature. Skin for abnormal spots. What immunizations do I need?  Vaccines are usually given at various ages, according to a schedule. Your health care provider will recommend vaccines for you based on your age, medical history, and lifestyle or other factors, such as travel or where you work. What tests do I need? Screening Your health care provider may recommend screening tests for certain conditions. This may include: Lipid and cholesterol levels. Diabetes screening. This is done by checking your blood sugar (glucose) after you have not eaten  for a while (fasting). Hepatitis C test. Hepatitis B test. HIV (human immunodeficiency virus) test. STI (sexually transmitted infection) testing, if you are at risk. Lung cancer screening. Colorectal cancer screening. Prostate cancer screening. Abdominal aortic aneurysm (AAA) screening. You may need this if you are a current or former smoker. Talk with your health care provider about your test results, treatment options, and if necessary, the need for more  tests. Follow these instructions at home: Eating and drinking  Eat a diet that includes fresh fruits and vegetables, whole grains, lean protein, and low-fat dairy products. Limit your intake of foods with high amounts of sugar, saturated fats, and salt. Take vitamin and mineral supplements as recommended by your health care provider. Do not drink alcohol if your health care provider tells you not to drink. If you drink alcohol: Limit how much you have to 0-2 drinks a day. Know how much alcohol is in your drink. In the [place] one drink equals one 12 oz bottle of beer (355 mL), one 5 oz glass of wine (148 mL), or one 1 oz glass of hard liquor (44 mL). Lifestyle Brush your teeth every morning and night with fluoride toothpaste. Floss one time each day. Exercise for at least 30 minutes 5 or more days each week. Do not use any products that contain nicotine or tobacco. These products include cigarettes, chewing tobacco, and vaping devices, such as e-cigarettes. If you need help quitting, ask your health care provider. Do not use drugs. If you are sexually active, practice safe sex. Use a condom or other form of protection to prevent STIs. Take aspirin only as told by your health care provider. Make sure that you understand how much to take and what form to take. Work with your health care provider to find out whether it is safe and beneficial for you to take aspirin daily. Ask your health care provider if you need to take a cholesterol-lowering medicine (statin). Find healthy ways to manage stress, such as: Meditation, yoga, or listening to music. Journaling. Talking to a trusted person. Spending time with friends and family. Safety Always wear your seat belt while driving or riding in a vehicle. Do not drive: If you have been drinking alcohol. Do not ride with someone who has been drinking. When you are tired or distracted. While texting. If you have been using any mind-altering substances  or drugs. Wear a helmet and other protective equipment during sports activities. If you have firearms in your house, make sure you follow all gun safety procedures. Minimize exposure to UV radiation to reduce your risk of skin cancer. What's next? Visit your health care provider once a year for an annual wellness visit. Ask your health care provider how often you should have your eyes and teeth checked. Stay up to date on all vaccines. This information is not intended to replace advice given to you by your health care provider. Make sure you discuss any questions you have with your health care provider. Document Revised: 04/27/2021 Document Reviewed: 04/27/2021 Elsevier Patient Education  2024 Elsevier Inc.      Signed,   Reyes Pines, MD [facility], [facility] 07/04/24 12:03 PM

## 2024-07-04 NOTE — Patient Instructions (Addendum)
 Call [facility] as it appears you are overdue for repeat colonoscopy - 3 year repeat planned in 2021. I would also discuss the continued diarrhea with GI as well although could still be due to your meds.  [facility] Address: [address] [place], [place] [address] Phone: [phone]  Follow-up with Dr. Lovie as planned.  Avoiding fluids within an hour of bedtime may be helpful but discussed the urinary symptoms at night with urology.  Keep follow-up with endocrinologist.  No med changes for me at this time and I do not see any need for blood work since you just recently had blood work with endocrinology.  Shingles vaccine can be given at your pharmacy as well as the flu and COVID boosters.  Let me know if you have any questions and take care!   Preventive Care 18 Years and Older, Male Preventive care refers to lifestyle choices and visits with your health care provider that can promote health and wellness. Preventive care visits are also called wellness exams. What can I expect for my preventive care visit? Counseling During your preventive care visit, your health care provider may ask about your: Medical history, including: Past medical problems. Family medical history. History of falls. Current health, including: Emotional well-being. Home life and relationship well-being. Sexual activity. Memory and ability to understand (cognition). Lifestyle, including: Alcohol, nicotine or tobacco, and drug use. Access to firearms. Diet, exercise, and sleep habits. Work and work Astronomer. Sunscreen use. Safety issues such as seatbelt and bike helmet use. Physical exam Your health care provider will check your: Height and weight. These may be used to calculate your BMI (body mass index). BMI is a measurement that tells if you are at a healthy weight. Waist circumference. This measures the distance around your waistline. This measurement also tells if you are at a healthy  weight and may help predict your risk of certain diseases, such as type 2 diabetes and high blood pressure. Heart rate and blood pressure. Body temperature. Skin for abnormal spots. What immunizations do I need?  Vaccines are usually given at various ages, according to a schedule. Your health care provider will recommend vaccines for you based on your age, medical history, and lifestyle or other factors, such as travel or where you work. What tests do I need? Screening Your health care provider may recommend screening tests for certain conditions. This may include: Lipid and cholesterol levels. Diabetes screening. This is done by checking your blood sugar (glucose) after you have not eaten for a while (fasting). Hepatitis C test. Hepatitis B test. HIV (human immunodeficiency virus) test. STI (sexually transmitted infection) testing, if you are at risk. Lung cancer screening. Colorectal cancer screening. Prostate cancer screening. Abdominal aortic aneurysm (AAA) screening. You may need this if you are a current or former smoker. Talk with your health care provider about your test results, treatment options, and if necessary, the need for more tests. Follow these instructions at home: Eating and drinking  Eat a diet that includes fresh fruits and vegetables, whole grains, lean protein, and low-fat dairy products. Limit your intake of foods with high amounts of sugar, saturated fats, and salt. Take vitamin and mineral supplements as recommended by your health care provider. Do not drink alcohol if your health care provider tells you not to drink. If you drink alcohol: Limit how much you have to 0-2 drinks a day. Know how much alcohol is in your drink. In the [place] one drink equals one 12  oz bottle of beer (355 mL), one 5 oz glass of wine (148 mL), or one 1 oz glass of hard liquor (44 mL). Lifestyle Brush your teeth every morning and night with fluoride toothpaste. Floss one time each  day. Exercise for at least 30 minutes 5 or more days each week. Do not use any products that contain nicotine or tobacco. These products include cigarettes, chewing tobacco, and vaping devices, such as e-cigarettes. If you need help quitting, ask your health care provider. Do not use drugs. If you are sexually active, practice safe sex. Use a condom or other form of protection to prevent STIs. Take aspirin only as told by your health care provider. Make sure that you understand how much to take and what form to take. Work with your health care provider to find out whether it is safe and beneficial for you to take aspirin daily. Ask your health care provider if you need to take a cholesterol-lowering medicine (statin). Find healthy ways to manage stress, such as: Meditation, yoga, or listening to music. Journaling. Talking to a trusted person. Spending time with friends and family. Safety Always wear your seat belt while driving or riding in a vehicle. Do not drive: If you have been drinking alcohol. Do not ride with someone who has been drinking. When you are tired or distracted. While texting. If you have been using any mind-altering substances or drugs. Wear a helmet and other protective equipment during sports activities. If you have firearms in your house, make sure you follow all gun safety procedures. Minimize exposure to UV radiation to reduce your risk of skin cancer. What's next? Visit your health care provider once a year for an annual wellness visit. Ask your health care provider how often you should have your eyes and teeth checked. Stay up to date on all vaccines. This information is not intended to replace advice given to you by your health care provider. Make sure you discuss any questions you have with your health care provider. Document Revised: 04/27/2021 Document Reviewed: 04/27/2021 Elsevier Patient Education  2024 [employer].

## 2024-07-30 DIAGNOSIS — R197 Diarrhea, unspecified: Secondary | ICD-10-CM | POA: Diagnosis not present

## 2024-07-30 DIAGNOSIS — Z86018 Personal history of other benign neoplasm: Secondary | ICD-10-CM | POA: Diagnosis not present

## 2024-08-04 DIAGNOSIS — R351 Nocturia: Secondary | ICD-10-CM | POA: Diagnosis not present

## 2024-08-04 DIAGNOSIS — N5201 Erectile dysfunction due to arterial insufficiency: Secondary | ICD-10-CM | POA: Diagnosis not present

## 2024-08-04 DIAGNOSIS — R3912 Poor urinary stream: Secondary | ICD-10-CM | POA: Diagnosis not present

## 2024-08-04 DIAGNOSIS — N486 Induration penis plastica: Secondary | ICD-10-CM | POA: Diagnosis not present

## 2024-08-06 DIAGNOSIS — D1801 Hemangioma of skin and subcutaneous tissue: Secondary | ICD-10-CM | POA: Diagnosis not present

## 2024-08-06 DIAGNOSIS — L570 Actinic keratosis: Secondary | ICD-10-CM | POA: Diagnosis not present

## 2024-08-06 DIAGNOSIS — L853 Xerosis cutis: Secondary | ICD-10-CM | POA: Diagnosis not present

## 2024-08-06 DIAGNOSIS — Z85828 Personal history of other malignant neoplasm of skin: Secondary | ICD-10-CM | POA: Diagnosis not present

## 2024-08-06 DIAGNOSIS — L814 Other melanin hyperpigmentation: Secondary | ICD-10-CM | POA: Diagnosis not present

## 2024-08-06 DIAGNOSIS — L821 Other seborrheic keratosis: Secondary | ICD-10-CM | POA: Diagnosis not present

## 2024-08-06 DIAGNOSIS — D485 Neoplasm of uncertain behavior of skin: Secondary | ICD-10-CM | POA: Diagnosis not present

## 2024-08-26 DIAGNOSIS — D128 Benign neoplasm of rectum: Secondary | ICD-10-CM | POA: Diagnosis not present

## 2024-08-26 DIAGNOSIS — K648 Other hemorrhoids: Secondary | ICD-10-CM | POA: Diagnosis not present

## 2024-08-26 DIAGNOSIS — Z860101 Personal history of adenomatous and serrated colon polyps: Secondary | ICD-10-CM | POA: Diagnosis not present

## 2024-08-26 DIAGNOSIS — D122 Benign neoplasm of ascending colon: Secondary | ICD-10-CM | POA: Diagnosis not present

## 2024-08-26 DIAGNOSIS — K5730 Diverticulosis of large intestine without perforation or abscess without bleeding: Secondary | ICD-10-CM | POA: Diagnosis not present

## 2024-08-26 DIAGNOSIS — Z09 Encounter for follow-up examination after completed treatment for conditions other than malignant neoplasm: Secondary | ICD-10-CM | POA: Diagnosis not present

## 2024-08-28 DIAGNOSIS — D128 Benign neoplasm of rectum: Secondary | ICD-10-CM | POA: Diagnosis not present

## 2024-08-28 DIAGNOSIS — D122 Benign neoplasm of ascending colon: Secondary | ICD-10-CM | POA: Diagnosis not present

## 2024-09-08 DIAGNOSIS — H02422 Myogenic ptosis of left eyelid: Secondary | ICD-10-CM | POA: Diagnosis not present

## 2024-09-08 DIAGNOSIS — H53483 Generalized contraction of visual field, bilateral: Secondary | ICD-10-CM | POA: Diagnosis not present

## 2024-09-08 DIAGNOSIS — H0279 Other degenerative disorders of eyelid and periocular area: Secondary | ICD-10-CM | POA: Diagnosis not present

## 2024-09-08 DIAGNOSIS — H02413 Mechanical ptosis of bilateral eyelids: Secondary | ICD-10-CM | POA: Diagnosis not present

## 2024-09-08 DIAGNOSIS — H02423 Myogenic ptosis of bilateral eyelids: Secondary | ICD-10-CM | POA: Diagnosis not present

## 2024-09-08 DIAGNOSIS — H02421 Myogenic ptosis of right eyelid: Secondary | ICD-10-CM | POA: Diagnosis not present

## 2024-09-08 DIAGNOSIS — H02834 Dermatochalasis of left upper eyelid: Secondary | ICD-10-CM | POA: Diagnosis not present

## 2024-09-08 DIAGNOSIS — H02831 Dermatochalasis of right upper eyelid: Secondary | ICD-10-CM | POA: Diagnosis not present

## 2024-09-08 DIAGNOSIS — H57813 Brow ptosis, bilateral: Secondary | ICD-10-CM | POA: Diagnosis not present

## 2024-09-13 ENCOUNTER — Encounter: Payer: Self-pay | Admitting: Family Medicine

## 2024-09-17 ENCOUNTER — Encounter: Payer: Self-pay | Admitting: Endocrinology

## 2024-09-17 ENCOUNTER — Ambulatory Visit: Payer: Self-pay | Admitting: Endocrinology

## 2024-09-17 ENCOUNTER — Ambulatory Visit: Admitting: Endocrinology

## 2024-09-17 VITALS — BP 118/70 | HR 75 | Resp 16 | Ht 69.0 in | Wt 161.6 lb

## 2024-09-17 DIAGNOSIS — E1165 Type 2 diabetes mellitus with hyperglycemia: Secondary | ICD-10-CM

## 2024-09-17 DIAGNOSIS — Z7985 Long-term (current) use of injectable non-insulin antidiabetic drugs: Secondary | ICD-10-CM

## 2024-09-17 LAB — POCT GLYCOSYLATED HEMOGLOBIN (HGB A1C): Hemoglobin A1C: 6.6 % — AB (ref 4.0–5.6)

## 2024-09-17 MED ORDER — OZEMPIC (1 MG/DOSE) 4 MG/3ML ~~LOC~~ SOPN: PEN_INJECTOR | SUBCUTANEOUS | 3 refills | Status: AC

## 2024-09-17 MED ORDER — PIOGLITAZONE HCL 30 MG PO TABS: 30.0000 mg | ORAL_TABLET | Freq: Every day | ORAL | 3 refills | Status: AC

## 2024-09-17 MED ORDER — GLIMEPIRIDE 2 MG PO TABS: 2.0000 mg | ORAL_TABLET | Freq: Every day | ORAL | 3 refills | Status: AC

## 2024-09-17 MED ORDER — GLUCOSE BLOOD VI STRP: 1.0000 | ORAL_STRIP | Freq: Two times a day (BID) | 3 refills | Status: AC

## 2024-09-17 MED ORDER — METFORMIN HCL ER 500 MG PO TB24: 2000.0000 mg | ORAL_TABLET | Freq: Every day | ORAL | 3 refills | Status: AC

## 2024-09-17 NOTE — Progress Notes (Signed)
 Outpatient Endocrinology Note Kamdin Follett, MD  09/17/24  Patient's Name: Joseph Sharp    DOB: 1951-10-02    MRN: [id number]                                                    REASON OF VISIT: Follow up for type 2 diabetes mellitus  PCP: Levora Reyes SAUNDERS, MD  HISTORY OF PRESENT ILLNESS:   Joseph Sharp is a 73 y.o. old male with past medical history listed below, is here for follow up for type 2 diabetes mellitus.   Pertinent Diabetes History: Patient was previously seen by Dr. Von and was last time seen in July 2024.  Patient was diagnosed with type 2 diabetes mellitus in 1977.  He has usually well-controlled type 2 diabetes mellitus.  Chronic Diabetes Complications : Retinopathy: no. Last ophthalmology exam was done on 06/2023, following with ophthalmology regularly.  Nephropathy: no Peripheral neuropathy: no Coronary artery disease: no Stroke: no  Relevant comorbidities and cardiovascular risk factors: Obesity: no Body mass index is 23.86 kg/m.  Hypertension: no Hyperlipidemia : Yes, on statin   Current / Home Diabetic regimen includes:  Metformin  XR 2000 mg daily.   Pioglitazone  30 mg daily.   Ozempic  1 mg weekly. Glimepiride /Amaryl  2 mg with supper.  Prior diabetic medications: Prandin , Trulicity  in the past.  Reports history of hypoglycemia with higher dose of Amaryl  4 mg in the past.  He tends to lose significant weight  when taking Ozempic  1 mg weekly in the past.  Glycemic data:   One Touch Verio reflect glucometer October 22 to September 17, 2024 reviewed.  Average blood sugar 148.  Mild fasting hyperglycemia with some of the blood sugar 164, 118, 140, 195, 133, 190.  Mostly acceptable blood sugar in the afternoon and bedtime in the range of 104-140.  Hypoglycemia: Patient has no hypoglycemic episodes. Patient has hypoglycemia awareness.  Factors modifying glucose control: 1.  Diabetic diet assessment: 3 meals a day.  He has been eating healthy  diet.  2.  Staying active or exercising: Walking.  3.  Medication compliance: compliant all of the time.  Interval history  Glucometer data as reviewed above.  Mild hyperglycemia in the morning fasting otherwise mostly acceptable blood sugar.  Hemoglobin A1c improved to 6.6%.  He reports he did not take glimepiride  for couple of months and restarted 1 week ago.  Diabetes regimen as reviewed and noted above.  No other complaints today.  REVIEW OF SYSTEMS As per history of present illness.   PAST MEDICAL HISTORY: Past Medical History:  Diagnosis Date   Allergy    Diabetes mellitus without complication (HCC)     PAST SURGICAL HISTORY: Past Surgical History:  Procedure Laterality Date   CATARACT EXTRACTION Bilateral 01/2024   EYE SURGERY  01/11/2013   Eyelid surgery    ALLERGIES: No Known Allergies  FAMILY HISTORY:  Family History  Problem Relation Age of Onset   Diabetes Mother    Arthritis Mother    Diabetes Brother    Cancer Neg Hx    Heart disease Neg Hx     SOCIAL HISTORY: Social History   Socioeconomic History   Marital status: Married    Spouse name: Olam   Number of children: 1   Years of education: Not on file   Highest education level:  Not on file  Occupational History   Occupation: RETIRED/Engineering    Comment: [employer]  Tobacco Use   Smoking status: Never   Smokeless tobacco: Never  Vaping Use   Vaping status: Never Used  Substance and Sexual Activity   Alcohol use: No   Drug use: No   Sexual activity: Yes  Other Topics Concern   Not on file  Social History Narrative   Lives with his wife.  Their daughter lives in [place], [place].   Social Drivers of Corporate Investment Banker Strain: Low Risk  (07/02/2024)   Overall Financial Resource Strain (CARDIA)    Difficulty of Paying Living Expenses: Not hard at all  Food Insecurity: No Food Insecurity (07/02/2024)   Hunger Vital Sign    Worried About Running Out of Food in the Last  Year: Never true    Ran Out of Food in the Last Year: Never true  Transportation Needs: No Transportation Needs (07/02/2024)   PRAPARE - Administrator, Civil Service (Medical): No    Lack of Transportation (Non-Medical): No  Physical Activity: Inactive (07/02/2024)   Exercise Vital Sign    Days of Exercise per Week: 0 days    Minutes of Exercise per Session: 0 min  Stress: No Stress Concern Present (07/02/2024)   [employer] of Occupational Health - Occupational Stress Questionnaire    Feeling of Stress: Not at all  Social Connections: Unknown (07/02/2024)   Social Connection and Isolation Panel    Frequency of Communication with Friends and Family: Once a week    Frequency of Social Gatherings with Friends and Family: Not on file    Attends Religious Services: More than 4 times per year    Active Member of [employer] or Organizations: Yes    Attends Banker Meetings: 1 to 4 times per year    Marital Status: Married    MEDICATIONS:  Current Outpatient Medications  Medication Sig Dispense Refill   Cyanocobalamin (B-12 PO) Take by mouth.     fluticasone  (FLONASE ) 50 MCG/ACT nasal spray Place 2 sprays into both nostrils daily. 16 g 0   pravastatin  (PRAVACHOL ) 20 MG tablet Take 1 tablet (20 mg total) by mouth daily. 90 tablet 3   tadalafil  (CIALIS ) 5 MG tablet Take 5 mg by mouth daily.     zinc gluconate 50 MG tablet Take by mouth.     glimepiride  (AMARYL ) 2 MG tablet Take 1 tablet (2 mg total) by mouth daily with supper. 90 tablet 3   glucose blood test strip 1 each by Other route in the morning and at bedtime. He has risk of hypoglycemia due to use of glimepiride , check two times a day. 200 each 3   metFORMIN  (GLUCOPHAGE -XR) 500 MG 24 hr tablet Take 4 tablets (2,000 mg total) by mouth daily. 360 tablet 3   pioglitazone  (ACTOS ) 30 MG tablet Take 1 tablet (30 mg total) by mouth daily. 90 tablet 3   Semaglutide , 1 MG/DOSE, (OZEMPIC , 1 MG/DOSE,) 4 MG/3ML SOPN INJECT  1 MG INTO THE SKIN ONE TIME PER WEEK 9 mL 3   No current facility-administered medications for this visit.    PHYSICAL EXAM: Vitals:   09/17/24 0925  BP: 118/70  Pulse: 75  Resp: 16  SpO2: 98%  Weight: 161 lb 9.6 oz (73.3 kg)  Height: 5' 9 (1.753 m)     Body mass index is 23.86 kg/m.  Wt Readings from Last 3 Encounters:  09/17/24 161 lb 9.6  oz (73.3 kg)  07/04/24 162 lb 6.4 oz (73.7 kg)  07/02/24 164 lb (74.4 kg)    General: Well developed, well nourished male in no apparent distress.  HEENT: AT/[place], no external lesions.  Eyes: Conjunctiva clear and no icterus. Neck: Neck supple  Lungs: Respirations not labored Neurologic: Alert, oriented, normal speech Extremities / Skin: Dry.  Psychiatric: Does not appear depressed or anxious  Diabetic Foot Exam - Simple   No data filed    LABS Reviewed Lab Results  Component Value Date   HGBA1C 6.6 (A) 09/17/2024   HGBA1C 7.5 (H) 06/13/2024   HGBA1C 6.8 (H) 02/07/2024   Lab Results  Component Value Date   FRUCTOSAMINE 300 (H) 03/22/2023   FRUCTOSAMINE 265 11/11/2013   Lab Results  Component Value Date   CHOL 153 06/13/2024   HDL 42 06/13/2024   LDLCALC 84 06/13/2024   LDLDIRECT 95.0 04/18/2019   TRIG 176 (H) 06/13/2024   CHOLHDL 3.6 06/13/2024   Lab Results  Component Value Date   MICRALBCREAT 3 02/07/2024   MICRALBCREAT 2.9 01/26/2014   Lab Results  Component Value Date   CREATININE 1.04 06/13/2024   Lab Results  Component Value Date   GFR 74.54 06/11/2023    ASSESSMENT / PLAN  1. Uncontrolled type 2 diabetes mellitus with hyperglycemia, without long-term current use of insulin (HCC)   2. Long-term current use of injectable noninsulin antidiabetic medication     Diabetes Mellitus type 2, complicated by no other known complications. - Diabetic status / severity: Fair control.  Improving.  Lab Results  Component Value Date   HGBA1C 6.6 (A) 09/17/2024    - Hemoglobin A1c goal : <6.5%  -  Medications: See below.  No change today.    I) continue metformin  XR 2000 mg daily.   II) continue pioglitazone /Actos  30 mg daily. III) continue Ozempic  1 mg weekly. IV) continue Amaryl  2 mg daily with supper.  Advised to escape glimepiride  if not eating meals.  - Home glucose testing: At least in the morning fasting and at bedtime.  - Discussed/ Gave Hypoglycemia treatment plan.  # Consult : not required at this time.   # Annual urine for microalbuminuria/ creatinine ratio, no microalbuminuria currently. Last  Lab Results  Component Value Date   MICRALBCREAT 3 02/07/2024    # Foot check nightly.  # Annual dilated diabetic eye exams.   - Diet: Make healthy diabetic food choices - Life style / activity / exercise: Discussed.  2. Blood pressure  -  BP Readings from Last 1 Encounters:  09/17/24 118/70    - Control is in target.  - No change in current plans.  3. Lipid status / Hyperlipidemia - Last  Lab Results  Component Value Date   LDLCALC 84 06/13/2024   - Continue pravastatin  20 mg daily.    Diagnoses and all orders for this visit:  Uncontrolled type 2 diabetes mellitus with hyperglycemia, without long-term current use of insulin (HCC) -     POCT glycosylated hemoglobin (Hb A1C) -     pioglitazone  (ACTOS ) 30 MG tablet; Take 1 tablet (30 mg total) by mouth daily. -     Semaglutide , 1 MG/DOSE, (OZEMPIC , 1 MG/DOSE,) 4 MG/3ML SOPN; INJECT 1 MG INTO THE SKIN ONE TIME PER WEEK -     metFORMIN  (GLUCOPHAGE -XR) 500 MG 24 hr tablet; Take 4 tablets (2,000 mg total) by mouth daily. -     glimepiride  (AMARYL ) 2 MG tablet; Take 1 tablet (2 mg total) by  mouth daily with supper. -     Basic metabolic panel with GFR -     Hemoglobin A1c -     Microalbumin / creatinine urine ratio  Long-term current use of injectable noninsulin antidiabetic medication -     POCT glycosylated hemoglobin (Hb A1C)  Other orders -     glucose blood test strip; 1 each by Other route in the  morning and at bedtime. He has risk of hypoglycemia due to use of glimepiride , check two times a day.    DISPOSITION Follow up in clinic in 4 months suggested.  Labs prior to follow-up visit as ordered.    All questions answered and patient verbalized understanding of the plan.  Emelie Newsom, MD [facility] Endocrinology [facility] [address] [place], [address] [place], [place] [address] Phone # [phone]  At least part of this note was generated using voice recognition software. Inadvertent word errors may have occurred, which were not recognized during the proofreading process.

## 2024-10-06 DIAGNOSIS — H53483 Generalized contraction of visual field, bilateral: Secondary | ICD-10-CM | POA: Diagnosis not present

## 2024-10-08 ENCOUNTER — Telehealth: Admitting: Physician Assistant

## 2024-10-08 DIAGNOSIS — B9789 Other viral agents as the cause of diseases classified elsewhere: Secondary | ICD-10-CM | POA: Diagnosis not present

## 2024-10-08 DIAGNOSIS — J019 Acute sinusitis, unspecified: Secondary | ICD-10-CM

## 2024-10-08 MED ORDER — IPRATROPIUM BROMIDE 0.03 % NA SOLN: 2.0000 | Freq: Two times a day (BID) | NASAL | 0 refills | Status: AC

## 2024-10-08 NOTE — Progress Notes (Signed)
 We are sorry that you are not feeling well.  Here is how we plan to help!  Based on what you have shared with me it looks like you have sinusitis.  Sinusitis is inflammation and infection in the sinus cavities of the head.  Based on your presentation I believe you most likely have Acute Viral Sinusitis.This is an infection most likely caused by a virus. There is not specific treatment for viral sinusitis other than to help you with the symptoms until the infection runs its course.  You may use an oral decongestant such as Mucinex D or if you have glaucoma or high blood pressure use plain Mucinex. Saline nasal spray help and can safely be used as often as needed for congestion, I have prescribed: Ipratropium Bromide nasal spray 0.03% 2 sprays in eah nostril 2-3 times a day  Some authorities believe that zinc sprays or the use of Echinacea may shorten the course of your symptoms.  Sinus infections are not as easily transmitted as other respiratory infection, however we still recommend that you avoid close contact with loved ones, especially the very young and elderly.  Remember to wash your hands thoroughly throughout the day as this is the number one way to prevent the spread of infection!  Home Care: Only take medications as instructed by your medical team. Do not take these medications with alcohol. A steam or ultrasonic humidifier can help congestion.  You can place a towel over your head and breathe in the steam from hot water coming from a faucet. Avoid close contacts especially the very young and the elderly. Cover your mouth when you cough or sneeze. Always remember to wash your hands.  Get Help Right Away If: You develop worsening fever or sinus pain. You develop a severe head ache or visual changes. Your symptoms persist after you have completed your treatment plan.  Make sure you Understand these instructions. Will watch your condition. Will get help right away if you are not doing  well or get worse.  Your e-visit answers were reviewed by a board certified advanced clinical practitioner to complete your personal care plan.  Depending on the condition, your plan could have included both over the counter or prescription medications.  If there is a problem please reply  once you have received a response from your provider.  Your safety is important to us .  If you have drug allergies check your prescription carefully.    You can use MyChart to ask questions about today's visit, request a non-urgent call back, or ask for a work or school excuse for 24 hours related to this e-Visit. If it has been greater than 24 hours you will need to follow up with your provider, or enter a new e-Visit to address those concerns.  You will get an e-mail in the next two days asking about your experience.  I hope that your e-visit has been valuable and will speed your recovery. Thank you for using e-visits.  I have spent 5 minutes in review of e-visit questionnaire, review and updating patient chart, medical decision making and response to patient.   Elsie Velma Lunger, PA-C

## 2024-10-15 DIAGNOSIS — D128 Benign neoplasm of rectum: Secondary | ICD-10-CM | POA: Diagnosis not present

## 2024-11-05 ENCOUNTER — Other Ambulatory Visit: Payer: Self-pay | Admitting: Endocrinology

## 2025-01-12 ENCOUNTER — Other Ambulatory Visit

## 2025-01-15 ENCOUNTER — Ambulatory Visit: Admitting: Endocrinology

## 2025-07-08 ENCOUNTER — Encounter: Admitting: Family Medicine
